# Patient Record
Sex: Female | Born: 1970 | Race: White | Hispanic: No | Marital: Single | Smoking: Former smoker
Health system: Southern US, Community
[De-identification: ages and names within clinical notes are randomized; demographics above are authoritative.]

## PROBLEM LIST (undated history)

## (undated) DIAGNOSIS — D649 Anemia, unspecified: Secondary | ICD-10-CM

## (undated) DIAGNOSIS — F410 Panic disorder [episodic paroxysmal anxiety] without agoraphobia: Secondary | ICD-10-CM

## (undated) DIAGNOSIS — I10 Essential (primary) hypertension: Secondary | ICD-10-CM

## (undated) DIAGNOSIS — K219 Gastro-esophageal reflux disease without esophagitis: Secondary | ICD-10-CM

## (undated) DIAGNOSIS — G894 Chronic pain syndrome: Secondary | ICD-10-CM

## (undated) DIAGNOSIS — R091 Pleurisy: Secondary | ICD-10-CM

## (undated) DIAGNOSIS — M26609 Unspecified temporomandibular joint disorder, unspecified side: Secondary | ICD-10-CM

## (undated) DIAGNOSIS — R011 Cardiac murmur, unspecified: Secondary | ICD-10-CM

## (undated) DIAGNOSIS — G8929 Other chronic pain: Secondary | ICD-10-CM

## (undated) DIAGNOSIS — K529 Noninfective gastroenteritis and colitis, unspecified: Secondary | ICD-10-CM

## (undated) DIAGNOSIS — R110 Nausea: Secondary | ICD-10-CM

## (undated) DIAGNOSIS — R109 Unspecified abdominal pain: Secondary | ICD-10-CM

## (undated) DIAGNOSIS — F419 Anxiety disorder, unspecified: Secondary | ICD-10-CM

## (undated) DIAGNOSIS — M549 Dorsalgia, unspecified: Secondary | ICD-10-CM

## (undated) DIAGNOSIS — J45909 Unspecified asthma, uncomplicated: Secondary | ICD-10-CM

## (undated) DIAGNOSIS — Z9889 Other specified postprocedural states: Secondary | ICD-10-CM

## (undated) HISTORY — DX: Anemia, unspecified: D64.9

## (undated) HISTORY — PX: LASER ABLATION: SHX1947

## (undated) HISTORY — PX: EYE SURGERY: SHX253

## (undated) HISTORY — PX: WISDOM TOOTH EXTRACTION: SHX21

## (undated) HISTORY — PX: ANKLE SURGERY: SHX546

## (undated) HISTORY — DX: Cardiac murmur, unspecified: R01.1

## (undated) HISTORY — DX: Unspecified asthma, uncomplicated: J45.909

## (undated) HISTORY — PX: OTHER SURGICAL HISTORY: SHX169

## (undated) HISTORY — PX: APPENDECTOMY: SHX54

## (undated) HISTORY — PX: DILATION AND CURETTAGE OF UTERUS: SHX78

---

## 1997-06-29 ENCOUNTER — Ambulatory Visit (HOSPITAL_COMMUNITY): Admission: RE | Admit: 1997-06-29 | Discharge: 1997-06-29 | Payer: Self-pay | Admitting: *Deleted

## 1997-07-30 ENCOUNTER — Ambulatory Visit (HOSPITAL_COMMUNITY): Admission: RE | Admit: 1997-07-30 | Discharge: 1997-07-30 | Payer: Self-pay | Admitting: *Deleted

## 1997-08-15 ENCOUNTER — Ambulatory Visit (HOSPITAL_COMMUNITY): Admission: RE | Admit: 1997-08-15 | Discharge: 1997-08-15 | Payer: Self-pay | Admitting: *Deleted

## 1997-12-06 ENCOUNTER — Inpatient Hospital Stay (HOSPITAL_COMMUNITY): Admission: AD | Admit: 1997-12-06 | Discharge: 1997-12-09 | Payer: Self-pay | Admitting: *Deleted

## 1998-01-10 ENCOUNTER — Inpatient Hospital Stay (HOSPITAL_COMMUNITY): Admission: AD | Admit: 1998-01-10 | Discharge: 1998-01-10 | Payer: Self-pay | Admitting: *Deleted

## 1998-07-15 ENCOUNTER — Emergency Department (HOSPITAL_COMMUNITY): Admission: EM | Admit: 1998-07-15 | Discharge: 1998-07-15 | Payer: Self-pay | Admitting: *Deleted

## 1999-06-05 ENCOUNTER — Ambulatory Visit (HOSPITAL_COMMUNITY): Admission: AD | Admit: 1999-06-05 | Discharge: 1999-06-05 | Payer: Self-pay | Admitting: *Deleted

## 1999-06-05 ENCOUNTER — Encounter: Payer: Self-pay | Admitting: *Deleted

## 1999-06-05 ENCOUNTER — Encounter (INDEPENDENT_AMBULATORY_CARE_PROVIDER_SITE_OTHER): Payer: Self-pay | Admitting: Specialist

## 2000-10-26 ENCOUNTER — Other Ambulatory Visit: Admission: RE | Admit: 2000-10-26 | Discharge: 2000-10-26 | Payer: Self-pay | Admitting: *Deleted

## 2001-05-24 ENCOUNTER — Encounter: Payer: Self-pay | Admitting: Orthopedic Surgery

## 2001-05-24 ENCOUNTER — Inpatient Hospital Stay (HOSPITAL_COMMUNITY): Admission: EM | Admit: 2001-05-24 | Discharge: 2001-05-25 | Payer: Self-pay | Admitting: Emergency Medicine

## 2001-05-24 ENCOUNTER — Encounter: Payer: Self-pay | Admitting: Emergency Medicine

## 2002-02-27 ENCOUNTER — Emergency Department (HOSPITAL_COMMUNITY): Admission: EM | Admit: 2002-02-27 | Discharge: 2002-02-27 | Payer: Self-pay | Admitting: Emergency Medicine

## 2002-03-29 ENCOUNTER — Other Ambulatory Visit: Admission: RE | Admit: 2002-03-29 | Discharge: 2002-03-29 | Payer: Self-pay | Admitting: Family Medicine

## 2003-08-21 ENCOUNTER — Emergency Department (HOSPITAL_COMMUNITY): Admission: AD | Admit: 2003-08-21 | Discharge: 2003-08-21 | Payer: Self-pay | Admitting: Family Medicine

## 2004-04-03 ENCOUNTER — Ambulatory Visit: Payer: Self-pay | Admitting: Family Medicine

## 2004-04-03 ENCOUNTER — Other Ambulatory Visit: Admission: RE | Admit: 2004-04-03 | Discharge: 2004-04-03 | Payer: Self-pay | Admitting: Family Medicine

## 2004-04-17 ENCOUNTER — Ambulatory Visit: Payer: Self-pay | Admitting: Family Medicine

## 2004-05-06 ENCOUNTER — Emergency Department (HOSPITAL_COMMUNITY): Admission: EM | Admit: 2004-05-06 | Discharge: 2004-05-06 | Payer: Self-pay | Admitting: Family Medicine

## 2004-05-23 ENCOUNTER — Ambulatory Visit: Payer: Self-pay | Admitting: Family Medicine

## 2004-07-31 ENCOUNTER — Ambulatory Visit: Payer: Self-pay | Admitting: Family Medicine

## 2004-09-16 ENCOUNTER — Ambulatory Visit: Payer: Self-pay | Admitting: Family Medicine

## 2004-12-25 ENCOUNTER — Ambulatory Visit: Payer: Self-pay | Admitting: Family Medicine

## 2004-12-30 ENCOUNTER — Encounter: Admission: RE | Admit: 2004-12-30 | Discharge: 2004-12-30 | Payer: Self-pay | Admitting: Family Medicine

## 2005-01-28 ENCOUNTER — Ambulatory Visit: Payer: Self-pay | Admitting: Family Medicine

## 2005-03-04 ENCOUNTER — Ambulatory Visit (HOSPITAL_COMMUNITY): Admission: RE | Admit: 2005-03-04 | Discharge: 2005-03-04 | Payer: Self-pay | Admitting: Gastroenterology

## 2005-03-04 ENCOUNTER — Encounter (INDEPENDENT_AMBULATORY_CARE_PROVIDER_SITE_OTHER): Payer: Self-pay | Admitting: *Deleted

## 2005-03-09 ENCOUNTER — Ambulatory Visit: Payer: Self-pay | Admitting: Sports Medicine

## 2005-03-11 ENCOUNTER — Encounter: Admission: RE | Admit: 2005-03-11 | Discharge: 2005-03-11 | Payer: Self-pay | Admitting: Sports Medicine

## 2005-04-14 ENCOUNTER — Ambulatory Visit: Payer: Self-pay | Admitting: Family Medicine

## 2005-07-07 ENCOUNTER — Ambulatory Visit: Payer: Self-pay | Admitting: Sports Medicine

## 2005-09-17 ENCOUNTER — Other Ambulatory Visit: Admission: RE | Admit: 2005-09-17 | Discharge: 2005-09-17 | Payer: Self-pay | Admitting: Family Medicine

## 2005-09-17 ENCOUNTER — Ambulatory Visit: Payer: Self-pay | Admitting: Family Medicine

## 2005-10-29 ENCOUNTER — Emergency Department (HOSPITAL_COMMUNITY): Admission: EM | Admit: 2005-10-29 | Discharge: 2005-10-29 | Payer: Self-pay | Admitting: Family Medicine

## 2005-12-16 ENCOUNTER — Encounter (INDEPENDENT_AMBULATORY_CARE_PROVIDER_SITE_OTHER): Payer: Self-pay | Admitting: *Deleted

## 2006-01-14 ENCOUNTER — Ambulatory Visit: Payer: Self-pay | Admitting: Family Medicine

## 2006-01-14 ENCOUNTER — Encounter: Admission: RE | Admit: 2006-01-14 | Discharge: 2006-01-14 | Payer: Self-pay | Admitting: Family Medicine

## 2006-02-25 ENCOUNTER — Ambulatory Visit: Payer: Self-pay | Admitting: Family Medicine

## 2006-04-05 ENCOUNTER — Ambulatory Visit: Payer: Self-pay | Admitting: Sports Medicine

## 2006-07-15 DIAGNOSIS — I10 Essential (primary) hypertension: Secondary | ICD-10-CM | POA: Insufficient documentation

## 2006-07-15 DIAGNOSIS — F429 Obsessive-compulsive disorder, unspecified: Secondary | ICD-10-CM | POA: Insufficient documentation

## 2006-07-15 DIAGNOSIS — Z6841 Body Mass Index (BMI) 40.0 and over, adult: Secondary | ICD-10-CM | POA: Insufficient documentation

## 2006-07-15 DIAGNOSIS — F410 Panic disorder [episodic paroxysmal anxiety] without agoraphobia: Secondary | ICD-10-CM | POA: Insufficient documentation

## 2006-07-15 DIAGNOSIS — D509 Iron deficiency anemia, unspecified: Secondary | ICD-10-CM | POA: Insufficient documentation

## 2006-07-15 DIAGNOSIS — F411 Generalized anxiety disorder: Secondary | ICD-10-CM | POA: Insufficient documentation

## 2006-07-15 DIAGNOSIS — K219 Gastro-esophageal reflux disease without esophagitis: Secondary | ICD-10-CM | POA: Insufficient documentation

## 2006-07-16 ENCOUNTER — Encounter (INDEPENDENT_AMBULATORY_CARE_PROVIDER_SITE_OTHER): Payer: Self-pay | Admitting: *Deleted

## 2006-08-06 ENCOUNTER — Telehealth: Payer: Self-pay | Admitting: *Deleted

## 2006-08-09 ENCOUNTER — Encounter (INDEPENDENT_AMBULATORY_CARE_PROVIDER_SITE_OTHER): Payer: Self-pay | Admitting: Family Medicine

## 2006-08-09 ENCOUNTER — Ambulatory Visit: Payer: Self-pay | Admitting: Family Medicine

## 2006-08-09 LAB — CONVERTED CEMR LAB
Albumin: 4.2 g/dL (ref 3.5–5.2)
Alkaline Phosphatase: 78 units/L (ref 39–117)
Antibody Screen: NEGATIVE
Basophils Relative: 1 % (ref 0–1)
Bilirubin Urine: NEGATIVE
Blood in Urine, dipstick: NEGATIVE
Hemoglobin: 11.6 g/dL — ABNORMAL LOW (ref 12.0–15.0)
Ketones, urine, test strip: NEGATIVE
Lymphs Abs: 1.7 10*3/uL (ref 0.7–3.3)
MCHC: 31.3 g/dL (ref 30.0–36.0)
Monocytes Absolute: 0.4 10*3/uL (ref 0.2–0.7)
Monocytes Relative: 6 % (ref 3–11)
Neutro Abs: 3.6 10*3/uL (ref 1.7–7.7)
Nitrite: NEGATIVE
RBC: 4.46 M/uL (ref 3.87–5.11)
Rh Type: POSITIVE
Rubella: 35.3 intl units/mL — ABNORMAL HIGH
TSH: 0.797 microintl units/mL (ref 0.350–5.50)
Total Bilirubin: 0.5 mg/dL (ref 0.3–1.2)
Total Protein: 6.9 g/dL (ref 6.0–8.3)
Urobilinogen, UA: 0.2
WBC: 5.9 10*3/uL (ref 4.0–10.5)
pH: 7.5

## 2006-08-10 ENCOUNTER — Encounter (INDEPENDENT_AMBULATORY_CARE_PROVIDER_SITE_OTHER): Payer: Self-pay | Admitting: Family Medicine

## 2006-08-10 LAB — CONVERTED CEMR LAB

## 2006-08-24 ENCOUNTER — Ambulatory Visit: Payer: Self-pay | Admitting: Family Medicine

## 2006-08-30 ENCOUNTER — Encounter (INDEPENDENT_AMBULATORY_CARE_PROVIDER_SITE_OTHER): Payer: Self-pay | Admitting: *Deleted

## 2006-08-30 ENCOUNTER — Telehealth: Payer: Self-pay | Admitting: *Deleted

## 2006-08-30 ENCOUNTER — Telehealth (INDEPENDENT_AMBULATORY_CARE_PROVIDER_SITE_OTHER): Payer: Self-pay | Admitting: *Deleted

## 2006-08-31 ENCOUNTER — Ambulatory Visit: Payer: Self-pay | Admitting: Family Medicine

## 2006-09-02 ENCOUNTER — Encounter (INDEPENDENT_AMBULATORY_CARE_PROVIDER_SITE_OTHER): Payer: Self-pay | Admitting: Family Medicine

## 2006-09-02 ENCOUNTER — Ambulatory Visit: Payer: Self-pay | Admitting: Family Medicine

## 2006-09-02 DIAGNOSIS — R011 Cardiac murmur, unspecified: Secondary | ICD-10-CM | POA: Insufficient documentation

## 2006-09-03 ENCOUNTER — Telehealth (INDEPENDENT_AMBULATORY_CARE_PROVIDER_SITE_OTHER): Payer: Self-pay | Admitting: Family Medicine

## 2006-09-03 LAB — CONVERTED CEMR LAB
HCT: 36.8 % (ref 36.0–46.0)
Iron: 22 ug/dL — ABNORMAL LOW (ref 42–145)
MCHC: 31.8 g/dL (ref 30.0–36.0)
MCV: 82.3 fL (ref 78.0–100.0)
Platelets: 339 10*3/uL (ref 150–400)
RDW: 14.5 % — ABNORMAL HIGH (ref 11.5–14.0)
Retic Ct Pct: 1 % (ref 0.4–3.1)
UIBC: 394 ug/dL

## 2006-09-22 ENCOUNTER — Ambulatory Visit: Payer: Self-pay | Admitting: Family Medicine

## 2006-09-22 ENCOUNTER — Encounter (INDEPENDENT_AMBULATORY_CARE_PROVIDER_SITE_OTHER): Payer: Self-pay | Admitting: Family Medicine

## 2006-09-28 ENCOUNTER — Telehealth: Payer: Self-pay | Admitting: *Deleted

## 2006-09-30 ENCOUNTER — Telehealth: Payer: Self-pay | Admitting: *Deleted

## 2006-10-05 ENCOUNTER — Ambulatory Visit (HOSPITAL_COMMUNITY): Admission: RE | Admit: 2006-10-05 | Discharge: 2006-10-05 | Payer: Self-pay | Admitting: Family Medicine

## 2006-10-06 ENCOUNTER — Encounter: Payer: Self-pay | Admitting: *Deleted

## 2006-10-27 ENCOUNTER — Ambulatory Visit: Payer: Self-pay | Admitting: Sports Medicine

## 2006-10-27 ENCOUNTER — Encounter (INDEPENDENT_AMBULATORY_CARE_PROVIDER_SITE_OTHER): Payer: Self-pay | Admitting: Family Medicine

## 2006-11-02 ENCOUNTER — Ambulatory Visit (HOSPITAL_COMMUNITY): Admission: RE | Admit: 2006-11-02 | Discharge: 2006-11-02 | Payer: Self-pay | Admitting: Family Medicine

## 2006-11-03 ENCOUNTER — Telehealth: Payer: Self-pay | Admitting: *Deleted

## 2006-11-03 ENCOUNTER — Ambulatory Visit: Payer: Self-pay | Admitting: Family Medicine

## 2006-11-03 ENCOUNTER — Encounter (INDEPENDENT_AMBULATORY_CARE_PROVIDER_SITE_OTHER): Payer: Self-pay | Admitting: Family Medicine

## 2006-11-03 LAB — CONVERTED CEMR LAB
Bilirubin Urine: NEGATIVE
Blood in Urine, dipstick: NEGATIVE
Glucose, Urine, Semiquant: NEGATIVE
Ketones, urine, test strip: NEGATIVE
Nitrite: NEGATIVE
Protein, U semiquant: NEGATIVE
Specific Gravity, Urine: 1.01
Urobilinogen, UA: 0.2
WBC Urine, dipstick: NEGATIVE
pH: 6.5

## 2006-11-16 ENCOUNTER — Ambulatory Visit (HOSPITAL_COMMUNITY): Admission: RE | Admit: 2006-11-16 | Discharge: 2006-11-16 | Payer: Self-pay | Admitting: Family Medicine

## 2006-11-17 ENCOUNTER — Encounter (INDEPENDENT_AMBULATORY_CARE_PROVIDER_SITE_OTHER): Payer: Self-pay | Admitting: Family Medicine

## 2006-11-25 ENCOUNTER — Ambulatory Visit: Payer: Self-pay | Admitting: Family Medicine

## 2006-12-03 ENCOUNTER — Other Ambulatory Visit: Admission: RE | Admit: 2006-12-03 | Discharge: 2006-12-03 | Payer: Self-pay | Admitting: Obstetrics and Gynecology

## 2006-12-14 ENCOUNTER — Ambulatory Visit (HOSPITAL_COMMUNITY): Admission: RE | Admit: 2006-12-14 | Discharge: 2006-12-14 | Payer: Self-pay | Admitting: Family Medicine

## 2006-12-29 ENCOUNTER — Encounter: Payer: Self-pay | Admitting: *Deleted

## 2007-02-01 ENCOUNTER — Ambulatory Visit (HOSPITAL_COMMUNITY): Admission: RE | Admit: 2007-02-01 | Discharge: 2007-02-01 | Payer: Self-pay | Admitting: Obstetrics and Gynecology

## 2007-03-01 ENCOUNTER — Ambulatory Visit (HOSPITAL_COMMUNITY): Admission: RE | Admit: 2007-03-01 | Discharge: 2007-03-01 | Payer: Self-pay | Admitting: Obstetrics and Gynecology

## 2007-03-24 ENCOUNTER — Telehealth (INDEPENDENT_AMBULATORY_CARE_PROVIDER_SITE_OTHER): Payer: Self-pay | Admitting: Family Medicine

## 2007-03-30 ENCOUNTER — Ambulatory Visit (HOSPITAL_COMMUNITY): Admission: RE | Admit: 2007-03-30 | Discharge: 2007-03-30 | Payer: Self-pay | Admitting: Obstetrics and Gynecology

## 2007-04-07 ENCOUNTER — Inpatient Hospital Stay (HOSPITAL_COMMUNITY): Admission: EM | Admit: 2007-04-07 | Discharge: 2007-04-08 | Payer: Self-pay | Admitting: Obstetrics and Gynecology

## 2007-04-15 ENCOUNTER — Observation Stay (HOSPITAL_COMMUNITY): Admission: RE | Admit: 2007-04-15 | Discharge: 2007-04-15 | Payer: Self-pay | Admitting: Obstetrics and Gynecology

## 2007-04-23 ENCOUNTER — Encounter (INDEPENDENT_AMBULATORY_CARE_PROVIDER_SITE_OTHER): Payer: Self-pay | Admitting: Obstetrics and Gynecology

## 2007-04-23 ENCOUNTER — Inpatient Hospital Stay (HOSPITAL_COMMUNITY): Admission: AD | Admit: 2007-04-23 | Discharge: 2007-04-25 | Payer: Self-pay | Admitting: Obstetrics and Gynecology

## 2007-05-31 ENCOUNTER — Emergency Department (HOSPITAL_COMMUNITY): Admission: EM | Admit: 2007-05-31 | Discharge: 2007-05-31 | Payer: Self-pay | Admitting: Emergency Medicine

## 2007-08-15 ENCOUNTER — Telehealth: Payer: Self-pay | Admitting: *Deleted

## 2007-08-16 ENCOUNTER — Telehealth (INDEPENDENT_AMBULATORY_CARE_PROVIDER_SITE_OTHER): Payer: Self-pay | Admitting: Family Medicine

## 2007-09-02 ENCOUNTER — Telehealth (INDEPENDENT_AMBULATORY_CARE_PROVIDER_SITE_OTHER): Payer: Self-pay | Admitting: *Deleted

## 2007-09-02 ENCOUNTER — Encounter (INDEPENDENT_AMBULATORY_CARE_PROVIDER_SITE_OTHER): Payer: Self-pay | Admitting: Family Medicine

## 2007-09-21 ENCOUNTER — Telehealth (INDEPENDENT_AMBULATORY_CARE_PROVIDER_SITE_OTHER): Payer: Self-pay | Admitting: Family Medicine

## 2007-09-27 ENCOUNTER — Encounter (INDEPENDENT_AMBULATORY_CARE_PROVIDER_SITE_OTHER): Payer: Self-pay | Admitting: *Deleted

## 2007-09-27 ENCOUNTER — Telehealth: Payer: Self-pay | Admitting: *Deleted

## 2007-09-27 ENCOUNTER — Encounter (INDEPENDENT_AMBULATORY_CARE_PROVIDER_SITE_OTHER): Payer: Self-pay | Admitting: Family Medicine

## 2008-03-05 ENCOUNTER — Ambulatory Visit: Payer: Self-pay | Admitting: Family Medicine

## 2008-04-02 ENCOUNTER — Encounter: Admission: RE | Admit: 2008-04-02 | Discharge: 2008-04-02 | Payer: Self-pay | Admitting: Internal Medicine

## 2008-07-03 ENCOUNTER — Telehealth: Payer: Self-pay | Admitting: *Deleted

## 2008-07-06 ENCOUNTER — Telehealth: Payer: Self-pay | Admitting: Family Medicine

## 2008-08-23 ENCOUNTER — Other Ambulatory Visit: Admission: RE | Admit: 2008-08-23 | Discharge: 2008-08-23 | Payer: Self-pay | Admitting: *Deleted

## 2008-08-23 ENCOUNTER — Ambulatory Visit: Payer: Self-pay | Admitting: Family Medicine

## 2008-08-23 ENCOUNTER — Encounter: Payer: Self-pay | Admitting: Family Medicine

## 2008-08-23 ENCOUNTER — Encounter: Admission: RE | Admit: 2008-08-23 | Discharge: 2008-08-23 | Payer: Self-pay | Admitting: *Deleted

## 2008-08-27 ENCOUNTER — Encounter: Payer: Self-pay | Admitting: Family Medicine

## 2008-08-29 ENCOUNTER — Encounter: Payer: Self-pay | Admitting: Family Medicine

## 2008-08-30 ENCOUNTER — Ambulatory Visit: Payer: Self-pay | Admitting: Family Medicine

## 2008-08-30 ENCOUNTER — Encounter: Payer: Self-pay | Admitting: Family Medicine

## 2008-08-30 LAB — CONVERTED CEMR LAB
ALT: 10 units/L (ref 0–35)
AST: 10 units/L (ref 0–37)
Albumin: 3.9 g/dL (ref 3.5–5.2)
Alkaline Phosphatase: 93 units/L (ref 39–117)
BUN: 13 mg/dL (ref 6–23)
CO2: 23 meq/L (ref 19–32)
Calcium: 9.1 mg/dL (ref 8.4–10.5)
Chloride: 107 meq/L (ref 96–112)
Cholesterol: 169 mg/dL (ref 0–200)
Creatinine, Ser: 0.76 mg/dL (ref 0.40–1.20)
Glucose, Bld: 87 mg/dL (ref 70–99)
HCT: 36.7 % (ref 36.0–46.0)
HDL: 43 mg/dL (ref >39)
Hemoglobin: 11.2 g/dL — ABNORMAL LOW (ref 12.0–15.0)
LDL Cholesterol: 113 mg/dL — ABNORMAL HIGH (ref 0–99)
MCHC: 30.5 g/dL (ref 30.0–36.0)
MCV: 81.4 fL (ref 78.0–100.0)
Platelets: 334 10*3/uL (ref 150–400)
Potassium: 4.2 meq/L (ref 3.5–5.3)
RBC: 4.51 M/uL (ref 3.87–5.11)
RDW: 13.8 % (ref 11.5–15.5)
Sodium: 139 meq/L (ref 135–145)
TSH: 1.607 microintl units/mL (ref 0.350–4.500)
Total Bilirubin: 0.4 mg/dL (ref 0.3–1.2)
Total CHOL/HDL Ratio: 3.9
Total Protein: 6.5 g/dL (ref 6.0–8.3)
Triglycerides: 66 mg/dL (ref <150)
VLDL: 13 mg/dL (ref 0–40)
Vit D, 25-Hydroxy: 33 ng/mL (ref 30–89)
WBC: 5.9 10*3/uL (ref 4.0–10.5)

## 2008-09-06 ENCOUNTER — Encounter: Payer: Self-pay | Admitting: Family Medicine

## 2008-09-11 ENCOUNTER — Encounter: Payer: Self-pay | Admitting: Family Medicine

## 2008-11-15 ENCOUNTER — Encounter: Payer: Self-pay | Admitting: Family Medicine

## 2009-05-13 ENCOUNTER — Emergency Department (HOSPITAL_COMMUNITY): Admission: EM | Admit: 2009-05-13 | Discharge: 2009-05-13 | Payer: Self-pay | Admitting: Emergency Medicine

## 2009-05-15 ENCOUNTER — Emergency Department (HOSPITAL_COMMUNITY): Admission: EM | Admit: 2009-05-15 | Discharge: 2009-05-15 | Payer: Self-pay | Admitting: Emergency Medicine

## 2009-07-06 ENCOUNTER — Emergency Department (HOSPITAL_COMMUNITY): Admission: EM | Admit: 2009-07-06 | Discharge: 2009-07-06 | Payer: Self-pay | Admitting: Family Medicine

## 2009-11-06 ENCOUNTER — Emergency Department (HOSPITAL_COMMUNITY): Admission: EM | Admit: 2009-11-06 | Discharge: 2009-11-06 | Payer: Self-pay | Admitting: Emergency Medicine

## 2009-12-24 ENCOUNTER — Encounter: Payer: Self-pay | Admitting: Family Medicine

## 2010-01-11 ENCOUNTER — Emergency Department (HOSPITAL_COMMUNITY): Admission: EM | Admit: 2010-01-11 | Discharge: 2010-01-11 | Payer: Self-pay | Admitting: Family Medicine

## 2010-02-23 ENCOUNTER — Emergency Department (HOSPITAL_COMMUNITY): Admission: EM | Admit: 2010-02-23 | Discharge: 2010-02-23 | Payer: Self-pay | Admitting: Emergency Medicine

## 2010-05-08 ENCOUNTER — Encounter: Payer: Self-pay | Admitting: Family Medicine

## 2010-06-09 ENCOUNTER — Encounter: Payer: Self-pay | Admitting: Internal Medicine

## 2010-06-19 NOTE — Miscellaneous (Signed)
  Clinical Lists Changes  Medications: Removed medication of ALPRAZOLAM 0.5 MG TABS (ALPRAZOLAM) 1 tab by mouth two times a day as needed panic attack or anxiety

## 2010-06-19 NOTE — Miscellaneous (Signed)
  Clinical Lists Changes  Problems: Removed problem of ROUTINE GYNECOLOGICAL EXAMINATION (ICD-V72.31) Removed problem of ANKLE PAIN, RIGHT (ICD-719.47) Removed problem of WEIGHT GAIN (ICD-783.1)

## 2010-07-07 ENCOUNTER — Emergency Department (HOSPITAL_COMMUNITY): Payer: Self-pay

## 2010-07-07 ENCOUNTER — Emergency Department (HOSPITAL_COMMUNITY)
Admission: EM | Admit: 2010-07-07 | Discharge: 2010-07-07 | Discharge status: Against Medical Advice | Payer: Self-pay | Attending: Emergency Medicine | Admitting: Emergency Medicine

## 2010-07-07 DIAGNOSIS — K219 Gastro-esophageal reflux disease without esophagitis: Secondary | ICD-10-CM | POA: Insufficient documentation

## 2010-07-07 DIAGNOSIS — IMO0002 Reserved for concepts with insufficient information to code with codable children: Secondary | ICD-10-CM | POA: Insufficient documentation

## 2010-07-07 DIAGNOSIS — M549 Dorsalgia, unspecified: Secondary | ICD-10-CM | POA: Insufficient documentation

## 2010-07-07 DIAGNOSIS — G8929 Other chronic pain: Secondary | ICD-10-CM | POA: Insufficient documentation

## 2010-07-07 DIAGNOSIS — R011 Cardiac murmur, unspecified: Secondary | ICD-10-CM | POA: Insufficient documentation

## 2010-07-07 DIAGNOSIS — M7989 Other specified soft tissue disorders: Secondary | ICD-10-CM | POA: Insufficient documentation

## 2010-07-07 DIAGNOSIS — R0602 Shortness of breath: Secondary | ICD-10-CM | POA: Insufficient documentation

## 2010-07-07 DIAGNOSIS — M79609 Pain in unspecified limb: Secondary | ICD-10-CM | POA: Insufficient documentation

## 2010-07-07 DIAGNOSIS — I10 Essential (primary) hypertension: Secondary | ICD-10-CM | POA: Insufficient documentation

## 2010-07-07 LAB — URINALYSIS, ROUTINE W REFLEX MICROSCOPIC
Hgb urine dipstick: NEGATIVE
Ketones, ur: NEGATIVE mg/dL
Protein, ur: NEGATIVE mg/dL
Urine Glucose, Fasting: NEGATIVE mg/dL
pH: 7.5 (ref 5.0–8.0)

## 2010-07-07 LAB — BRAIN NATRIURETIC PEPTIDE: Pro B Natriuretic peptide (BNP): 46.4 pg/mL (ref 0.0–100.0)

## 2010-07-07 LAB — BASIC METABOLIC PANEL
Chloride: 109 mEq/L (ref 96–112)
GFR calc non Af Amer: >60 mL/min (ref >60)
Potassium: 4 mEq/L (ref 3.5–5.1)
Sodium: 139 mEq/L (ref 135–145)

## 2010-07-07 LAB — DIFFERENTIAL
Basophils Relative: 1 % (ref 0–1)
Eosinophils Absolute: 0.3 10*3/uL (ref 0.0–0.7)
Lymphs Abs: 1.4 10*3/uL (ref 0.7–4.0)
Neutro Abs: 4.3 10*3/uL (ref 1.7–7.7)
Neutrophils Relative %: 66 % (ref 43–77)

## 2010-07-07 LAB — CBC
Hemoglobin: 9 g/dL — ABNORMAL LOW (ref 12.0–15.0)
MCV: 76.6 fL — ABNORMAL LOW (ref 78.0–100.0)
Platelets: 328 10*3/uL (ref 150–400)
RBC: 3.93 MIL/uL (ref 3.87–5.11)
WBC: 6.5 10*3/uL (ref 4.0–10.5)

## 2010-07-07 LAB — RAPID URINE DRUG SCREEN, HOSP PERFORMED
Amphetamines: NOT DETECTED
Barbiturates: NOT DETECTED
Benzodiazepines: POSITIVE — AB
Cocaine: NOT DETECTED
Opiates: NOT DETECTED
Tetrahydrocannabinol: NOT DETECTED

## 2010-07-07 LAB — URINE MICROSCOPIC-ADD ON

## 2010-07-07 LAB — PREGNANCY, URINE: Preg Test, Ur: NEGATIVE

## 2010-07-09 ENCOUNTER — Encounter: Payer: Self-pay | Admitting: *Deleted

## 2010-07-30 LAB — URINALYSIS, ROUTINE W REFLEX MICROSCOPIC
Glucose, UA: NEGATIVE mg/dL
Hgb urine dipstick: NEGATIVE
Specific Gravity, Urine: 1.008 (ref 1.005–1.030)
pH: 6.5 (ref 5.0–8.0)

## 2010-07-30 LAB — URINE MICROSCOPIC-ADD ON

## 2010-07-30 LAB — CBC
MCH: 24.3 pg — ABNORMAL LOW (ref 26.0–34.0)
Platelets: 359 10*3/uL (ref 150–400)
RBC: 4.75 MIL/uL (ref 3.87–5.11)

## 2010-07-30 LAB — URINE CULTURE: Culture  Setup Time: 201110091725

## 2010-07-30 LAB — BASIC METABOLIC PANEL
CO2: 27 mEq/L (ref 19–32)
Calcium: 9.5 mg/dL (ref 8.4–10.5)
Creatinine, Ser: 0.73 mg/dL (ref 0.4–1.2)
GFR calc Af Amer: >60 mL/min (ref >60)

## 2010-07-30 LAB — DIFFERENTIAL
Basophils Relative: 0 % (ref 0–1)
Eosinophils Absolute: 0.2 10*3/uL (ref 0.0–0.7)
Lymphs Abs: 1.4 10*3/uL (ref 0.7–4.0)
Neutrophils Relative %: 75 % (ref 43–77)

## 2010-07-31 LAB — POCT URINALYSIS DIPSTICK
Bilirubin Urine: NEGATIVE
Glucose, UA: NEGATIVE mg/dL
Nitrite: NEGATIVE

## 2010-07-31 LAB — URINE CULTURE: Culture  Setup Time: 201108271806

## 2010-08-03 LAB — POCT CARDIAC MARKERS
CKMB, poc: <1 ng/mL — ABNORMAL LOW (ref 1.0–8.0)
Myoglobin, poc: 43.2 ng/mL (ref 12–200)
Troponin i, poc: <0.05 ng/mL (ref 0.00–0.09)

## 2010-08-03 LAB — CBC
HCT: 34.7 % — ABNORMAL LOW (ref 36.0–46.0)
Hemoglobin: 11.3 g/dL — ABNORMAL LOW (ref 12.0–15.0)
MCHC: 32.5 g/dL (ref 30.0–36.0)
MCV: 77.5 fL — ABNORMAL LOW (ref 78.0–100.0)
RDW: 15.8 % — ABNORMAL HIGH (ref 11.5–15.5)

## 2010-08-03 LAB — POCT I-STAT, CHEM 8
Calcium, Ion: 1.19 mmol/L (ref 1.12–1.32)
HCT: 37 % (ref 36.0–46.0)
TCO2: 23 mmol/L (ref 0–100)

## 2010-08-03 LAB — DIFFERENTIAL
Basophils Absolute: 0 10*3/uL (ref 0.0–0.1)
Basophils Relative: 1 % (ref 0–1)
Eosinophils Relative: 3 % (ref 0–5)
Monocytes Absolute: 0.6 10*3/uL (ref 0.1–1.0)

## 2010-08-03 LAB — POCT PREGNANCY, URINE: Preg Test, Ur: NEGATIVE

## 2010-08-10 ENCOUNTER — Emergency Department (HOSPITAL_COMMUNITY): Payer: Self-pay

## 2010-08-10 ENCOUNTER — Emergency Department (HOSPITAL_COMMUNITY)
Admission: EM | Admit: 2010-08-10 | Discharge: 2010-08-10 | Disposition: A | Discharge status: Home / Self Care | Payer: No Typology Code available for payment source | Attending: Emergency Medicine | Admitting: Emergency Medicine

## 2010-08-10 DIAGNOSIS — R079 Chest pain, unspecified: Secondary | ICD-10-CM | POA: Insufficient documentation

## 2010-08-10 DIAGNOSIS — M542 Cervicalgia: Secondary | ICD-10-CM | POA: Insufficient documentation

## 2010-08-10 DIAGNOSIS — Z79899 Other long term (current) drug therapy: Secondary | ICD-10-CM | POA: Insufficient documentation

## 2010-08-10 DIAGNOSIS — M545 Low back pain, unspecified: Secondary | ICD-10-CM | POA: Insufficient documentation

## 2010-08-10 DIAGNOSIS — K219 Gastro-esophageal reflux disease without esophagitis: Secondary | ICD-10-CM | POA: Insufficient documentation

## 2010-08-10 DIAGNOSIS — Y929 Unspecified place or not applicable: Secondary | ICD-10-CM | POA: Insufficient documentation

## 2010-08-10 DIAGNOSIS — R6884 Jaw pain: Secondary | ICD-10-CM | POA: Insufficient documentation

## 2010-08-10 DIAGNOSIS — I10 Essential (primary) hypertension: Secondary | ICD-10-CM | POA: Insufficient documentation

## 2010-08-10 DIAGNOSIS — S322XXA Fracture of coccyx, initial encounter for closed fracture: Secondary | ICD-10-CM | POA: Insufficient documentation

## 2010-08-10 DIAGNOSIS — S3210XA Unspecified fracture of sacrum, initial encounter for closed fracture: Secondary | ICD-10-CM | POA: Insufficient documentation

## 2010-08-13 ENCOUNTER — Emergency Department (HOSPITAL_COMMUNITY): Payer: No Typology Code available for payment source

## 2010-08-13 ENCOUNTER — Emergency Department (HOSPITAL_COMMUNITY)
Admission: EM | Admit: 2010-08-13 | Discharge: 2010-08-13 | Disposition: A | Discharge status: Home / Self Care | Payer: No Typology Code available for payment source | Attending: Emergency Medicine | Admitting: Emergency Medicine

## 2010-08-13 DIAGNOSIS — M25559 Pain in unspecified hip: Secondary | ICD-10-CM | POA: Insufficient documentation

## 2010-08-13 DIAGNOSIS — M546 Pain in thoracic spine: Secondary | ICD-10-CM | POA: Insufficient documentation

## 2010-08-13 DIAGNOSIS — I10 Essential (primary) hypertension: Secondary | ICD-10-CM | POA: Insufficient documentation

## 2010-08-13 DIAGNOSIS — M545 Low back pain, unspecified: Secondary | ICD-10-CM | POA: Insufficient documentation

## 2010-08-13 DIAGNOSIS — K219 Gastro-esophageal reflux disease without esophagitis: Secondary | ICD-10-CM | POA: Insufficient documentation

## 2010-08-13 DIAGNOSIS — Z79899 Other long term (current) drug therapy: Secondary | ICD-10-CM | POA: Insufficient documentation

## 2010-09-30 NOTE — H&P (Signed)
[name], [name]                 ACCOUNT NO.:  [id number]   MEDICAL RECORD NO.:  [id number]          PATIENT TYPE:  INP   LOCATION:  9169                          [facility]  [facility]   PHYSICIAN:  [name], M.D.DATE OF BIRTH:  1971-02-05   DATE OF ADMISSION:  04/07/2007  DATE OF DISCHARGE:                              HISTORY & PHYSICAL   This is a 40 year old gravida 5, para 2-0-2-2, [facility] April 15, 2007,  approximately [redacted] weeks gestation.  Prenatal care has been complicated by  advanced maternal age, history of molar pregnancy, history of chronic  hypertension, and history of panic attacks.  Patient is also GBS  positive.  She declined amniocentesis.  Blood pressures have been quite  satisfactory.  She was admitted for induction secondary to  musculoskeletal discomfort at term.   MEDICATIONS:  Vitamins, Nexium.   PAST MEDICAL HISTORY:  Medical:  History of chronic hypertension.   SURGICAL HISTORY:  Please see [facility] forms.   ALLERGIES:  1. MORPHINE.  2. FLAGYL.   FAMILY HISTORY:  Noncontributory.   SOCIAL HISTORY:  Patient denies use of tobacco or significant alcohol.   REVIEW OF SYSTEMS:  Noncontributory.   PHYSICAL EXAMINATION:  A well-developed, well-nourished, pleasant white  female in no acute distress.  Afebrile.  VITAL SIGNS:  Stable.  CHEST:  Lungs are clear.  CARDIAC:  Regular rate and rhythm.  BREAST EXAM:  Deferred.  ABDOMEN:  Gravid with a term fundal height.  Fetal heart tones are  auscultated.  PELVIC EXAMINATION:  Cervix is approximately fingertip dilatation, long,  presenting part high.   IMPRESSION:  1. Intrauterine pregnancy at [redacted] weeks gestation.  2. Advanced maternal age.  3. History of chronic hypertension.  4. History of panic attacks.  5. Musculoskeletal discomfort with desire for induction.   PLAN:  1. Admit.  2. Will institute Cytotec.  3. Will institute Pitocin probably April 08, 2007.      [name], M.D.  Electronically Signed     JAM/MEDQ  D:  04/07/2007  T:  04/07/2007  Job:  [id number]

## 2010-10-03 NOTE — H&P (Signed)
[facility]  Patient:    PETREA, FREDENBURG Visit Number: [id number] MRN: [id number]          Service Type: MED Location: 1E 0101 01 Attending Physician:  Tobey Bride Dictated by:   Illene Labrador. Aplington, M.D. Proc. Date: 05/24/01 Admit Date:  05/24/2001                           History and Physical  HISTORY OF PRESENT ILLNESS: I am seeing this 40 year old female in the [facility] this morning for her right ankle. She was going to the bathroom late yesterday evening and slipped on a step down step injuring her right ankle. EMS brought her to the [facility] Room where x-rays demonstrated at least a bimalleolar and possibly a trimalleolar fracture dislocation of her right ankle. Clinically there was ecchymosis and tinting of the skin medially but no actual compounding. After being called to the emergency room and evaluating the situation, I injected 20 cc of 2% xylocaine into the right ankle with 10 cc in the joint and 10 cc over the distal fibula fracture site following a Betadine prep. I was then able to reduce the ankle and placed a short leg splint cast and she is posted for surgery later today.  PAST MEDICAL HISTORY:  Generally good healthy.  PAST SURGICAL HISTORY:  D&C for a hydatidiform mole but other than that no surgical problems. She has had a fracture of her left foot.  MEDICATIONS:  She presently taking no medications.  ALLERGIES:  None.  REVIEW OF SYSTEMS:  No major health problems.  SOCIAL HISTORY:  Significant in that she is a smoker.  PHYSICAL EXAMINATION:  GENERAL:  Pleasant female initially in severe distress but following injection and reduction of her ankle very comfortable. Significantly over weight.  VITAL SIGNS:  Blood pressure on arrival was 108/67, pulse 90 and regular.  HEENT:  Normal.  NECK:  Supple without masses.  LUNGS:  Clear.  HEART:  Regular rhythm without murmurs, rubs or  gallops.  ABDOMEN:  Soft, nontender without masses.  GENITALIA/RECTAL/BREASTS:  Not done not felt pertinent to present illness.  ORTHOPEDIC:  Positive findings for ______ to the right ankle with some obvious deformity with the foot angulated lateralward. There is ecchymosis and tinting of the skin medially. She has good dorsalis pedis pulse.  Sensation appears to be intact.  IMPRESSION:  Closed displaced bimalleolar (possibly trimalleolar fracture) dislocation, right ankle. Dictated by:   Illene Labrador. Aplington, M.D. Attending Physician:  Tobey Bride DD:  05/24/01 TD:  05/24/01 Job: [id number]

## 2010-10-03 NOTE — H&P (Signed)
NAMELALLIE, STRAHM                 ACCOUNT NO.:  [id number]   MEDICAL RECORD NO.:  [id number]          PATIENT TYPE:  OBV   LOCATION:  9165                          [facility]  [facility]   PHYSICIAN:  James A. Ashley Royalty, M.D.DATE OF BIRTH:  1971/04/25   DATE OF ADMISSION:  04/15/2007  DATE OF DISCHARGE:                              HISTORY & PHYSICAL   HISTORY OF PRESENT ILLNESS:  This is a 40 year old, G5, P2-0-2-2,  currently at 46 weeks' gestation who was admitted approximately 1 week  ago in attempt to induce her labor due to musculoskeletal discomfort.  The intention was to give Cytotec to ripen the cervix, but  unfortunately, the patient was contracting too frequently to allow the  use of that product.  She was discharged home and presents now with  repeat induction.   Medications and past medical history unchanged.   PHYSICAL EXAMINATION:  GENERAL:  Well-developed, well-nourished,  pleasant, white female in no acute distress.  VITAL SIGNS:  Afebrile, vital signs stable.  CHEST:  Lungs are clear.  CARDIAC:  Regular rate and rhythm.  ABDOMEN:  Gravid with normal fundal height.  PELVIC:  Please see labor and delivery notes.   IMPRESSION:  1. Intrauterine pregnancy at 40 weeks' gestation.  2. Additional diagnoses per [facility] forms and history and physical      of 1 week ago.   PLAN:  Will reattempt induction.      James A. Ashley Royalty, M.D.  Electronically Signed     JAM/MEDQ  D:  06/02/2007  T:  06/03/2007  Job:  [id number]

## 2010-10-03 NOTE — Discharge Summary (Signed)
[name], [name]                 ACCOUNT NO.:  [id number]   MEDICAL RECORD NO.:  [id number]          PATIENT TYPE:  INP   LOCATION:  9169                          [facility]   PHYSICIAN:  James A. Ashley Royalty, M.D.DATE OF BIRTH:  [birth year]   DATE OF ADMISSION:  04/07/2007  DATE OF DISCHARGE:  04/08/2007                               DISCHARGE SUMMARY   DISCHARGE DIAGNOSES:  1. Intrauterine pregnancy at 39-week gestation.  2. Advanced maternal age.  3. History of chronic hypertension.  4. History of panic attacks.  5. Musculoskeletal discomfort with desire for induction.  6. Unsuccessful induction during this hospitalization.   CONSULTATIONS:  None.   DISCHARGE MEDICATIONS:  Vitamins.   HISTORY AND PHYSICAL:  This is a 40 year old gravida 5, para 2-0-2-2 at  39-week gestation.  Prenatal care was complicated by the aforementioned  risk factors.  The patient was admitted for induction with the  aforementioned reasons.   On the evening of admission, the intension was to give Cytotec.  However, the patient was contracting too frequently for Cytotec to be  administered.   On the morning of April 08, 2007, she was noted to have a cervical  examination of fingertip/long/high.  Artificial rupture of membranes was  not possible.  Pitocin was administered, but there was no significant  change, and ultimately, the decision was made to abandon the induction  during this hospitalization as Cytotec was felt to be an interval part  of the ripening process and it was not possible to give that product.   The patient was allowed to be discharged home with the intention of  returning in the vicinity of 1 week to reattempt the induction.      James A. Ashley Royalty, M.D.  Electronically Signed     JAM/MEDQ  D:  06/02/2007  T:  06/03/2007  Job:  [id number]

## 2010-10-03 NOTE — Op Note (Signed)
[facility]  Patient:    [name], [name] Visit Number: [id number] MRN: [id number]          Service Type: MED Location: [phone] Attending Physician:  Marlowe Kays Page Dictated by:   Illene Labrador. Aplington, M.D. Proc. Date: 05/24/01 Admit Date:  05/24/2001                             Operative Report  PREOPERATIVE DIAGNOSIS:  Closed bimalleolar fracture dislocation of right ankle.  POSTOPERATIVE DIAGNOSIS:  Closed bimalleolar fracture dislocation of right ankle.  OPERATION:  ORIF of bimalleolar fracture dislocation of right ankle with two 3.5 cannulated screws in the medial malleolus and rush rod in distal fibula.  SURGEON:  Illene Labrador. Aplington, M.D.  ASSISTANT:  Irena Cords, P.A.-C.  ANESTHESIA:  General.  PATHOLOGY AND JUSTIFICATION FOR PROCEDURE:  Earlier today, I performed a closed reduction of the dislocation and better position of the fracture in the emergency room under local and she is here today for the definitive internal fixation because this was an unstable fracture.  The fracture of the medial malleolus was at the level of the plafond and the distal fibula was comminuted.  DESCRIPTION OF PROCEDURE:  Prophylactic antibiotics.  Satisfactory general anesthesia.  C-arm was brought in and the exact status of the fracture was identified.  The postoperative splint from earlier today was carefully removed, and the leg protected as we prepped with DuraPrep, and draped in a sterile field.  She had a significant area of contusion where the medial malleolus had almost gone through the skin medially.  I made my incision just anterior to this, curving down beneath the medial malleolus.  With blunt dissection, the fracture site was identified, and periosteum stripped to either side enough so that we could make out the fracture edges and anatomically reduce the fracture.  We then placed two guide pins up through the medial malleolus and into  the tibia, and when I was satisfied of their position in AP and lateral projections, these were measured at 50 mm, and these were drilled, and two 50 mm, 3.5 screws placed.  X-ray showed anatomic reduction of the medial malleolus.  The posterior screw looked like it was pried to the bone, but it was actually imbedded, so we felt this was an optical illusion.  Laterally, I made a small vertical incision over the distal fibula, and placed a drill hole in the distal fibula followed by awl, which I was able to place up the fibular shaft under AP and lateral C-arm control.  I then followed with a 14 cm rush rod of similar size and position was checked on AP and lateral C-arm with anatomic reduction on the fracture.  The comminuted distal fibular fracture maintained.  The mortis was also in good position.  Final x-rays were taken after impacting the rush rod.  Both wounds were then copiously irrigated with antibiotic solution.  The small lateral incision was closed with an interrupted 3-0 Vicryl subcutaneously, and interrupted 4-0 nylon on the skin. Medially, I reapproximated the periosteum and capsular tissues with interrupted 2-0 Vicryl and the skin with multiple interrupted 4-0 nylon mattress sutures.  Half percent plain Marcaine was injected into both wounds.  Betadine Adaptic dry sterile dressing with a well-padded short leg splint cast applied.  The tourniquet was released.  She tolerated the procedure well and was taken to the recovery room in satisfactory condition with no known  complications. Dictated by:   Illene Labrador. Aplington, M.D. Attending Physician:  Joaquin Courts DD:  05/24/01 TD:  05/25/01 Job: [id number]

## 2010-10-03 NOTE — Op Note (Signed)
[name], [name]                 ACCOUNT NO.:  [id number]   MEDICAL RECORD NO.:  [id number]          PATIENT TYPE:  AMB   LOCATION:  ENDO                         FACILITY:  [facility]   PHYSICIAN:  Graylin Shiver, M.D.   DATE OF BIRTH:  June 24, 1970   DATE OF PROCEDURE:  03/04/2005  DATE OF DISCHARGE:                                 OPERATIVE REPORT   PROCEDURE:  Esophagogastroduodenoscopy with biopsy.   INDICATIONS FOR PROCEDURE:  Chronic heartburn.   Informed consent was obtained after explanation of the risks of bleeding,  infection and perforation.   PREMEDICATION:  Fentanyl 100 mcg IV, Versed 10 mg IV.   PROCEDURE:  With the patient in the left lateral decubitus position, the  Olympus gastroscope was inserted into the oropharynx and passed into the  esophagus. It was advanced down the esophagus and into the stomach and into  the duodenum. The second portion and bulb of the duodenum looked normal. The  stomach showed an erythematous appearance to the mucosa compatible with a  gastritis. Biopsies were obtained from the stomach for histology and to look  for evidence of H. pylori. No lesions were seen in the fundus or cardia. The  esophagus looked normal. The esophagogastric junction was at 38 cm. The  distal esophagus was biopsied for histology. She tolerated the procedure  well without complications.   IMPRESSION:  Gastritis.   PLAN:  The biopsies will be checked. The patient will remain on Prevacid 30  mg b.i.d.           ______________________________  Graylin Shiver, M.D.     SFG/MEDQ  D:  03/04/2005  T:  03/04/2005  Job:  [id number]   cc:   Towana Badger, M.D.  Fax: [phone]

## 2010-10-03 NOTE — Discharge Summary (Signed)
Sharon, Stokes                 ACCOUNT NO.:  [id number]   MEDICAL RECORD NO.:  [id number]          PATIENT TYPE:  OBV   LOCATION:  9165                          [facility]   PHYSICIAN:  James A. Ashley Royalty, M.D.DATE OF BIRTH:  09-Mar-1971   DATE OF ADMISSION:  04/15/2007  DATE OF DISCHARGE:  04/15/2007                               DISCHARGE SUMMARY   DISCHARGE DIAGNOSES:  1. Intrauterine pregnancy at 40 weeks' gestation, undelivered.  2. Unsuccessful induction.   CONSULTATIONS:  None.   DISCHARGE MEDICATIONS:  Vitamins.   HOSPITAL COURSE:  The patient was admitted for induction of labor due to  musculoskeletal discomfort.  The cervix did not respond to Pitocin a  week ago and similarly did not significantly respond to Pitocin again.  After consultation with the patient nurse and myself, the decision was  made to postpone the induction one more week and hence the patient was  allowed to be discharged home afebrile in satisfactory condition later  on that day.   DISPOSITION:  The patient will return to [facility] and  Obstetrics in several days for followup.      James A. Ashley Royalty, M.D.  Electronically Signed     JAM/MEDQ  D:  06/02/2007  T:  06/02/2007  Job:  [id number]

## 2011-02-23 LAB — CBC
HCT: 31.4 — ABNORMAL LOW
Hemoglobin: 10.8 — ABNORMAL LOW
Hemoglobin: 13.5
MCV: 89.1
Platelets: 216
RBC: 3.53 — ABNORMAL LOW
RBC: 3.65 — ABNORMAL LOW
RBC: 4.44
WBC: 11.4 — ABNORMAL HIGH
WBC: 13.3 — ABNORMAL HIGH
WBC: 14.4 — ABNORMAL HIGH

## 2011-02-23 LAB — TYPE AND SCREEN: ABO/RH(D): A POS

## 2011-02-23 LAB — RPR: RPR Ser Ql: NONREACTIVE

## 2011-02-24 LAB — CBC
HCT: 38.1
Hemoglobin: 13.1
MCHC: 34.3
MCHC: 34.5
MCV: 87.8
MCV: 88
RBC: 4.11
RDW: 13.3
RDW: 13.6

## 2011-02-24 LAB — RPR: RPR Ser Ql: NONREACTIVE

## 2011-03-22 ENCOUNTER — Encounter: Payer: Self-pay | Admitting: *Deleted

## 2011-03-22 ENCOUNTER — Emergency Department (INDEPENDENT_AMBULATORY_CARE_PROVIDER_SITE_OTHER)
Admission: EM | Admit: 2011-03-22 | Discharge: 2011-03-22 | Disposition: A | Discharge status: Home / Self Care | Payer: Self-pay | Source: Home / Self Care | Attending: Family Medicine | Admitting: Family Medicine

## 2011-03-22 ENCOUNTER — Emergency Department (INDEPENDENT_AMBULATORY_CARE_PROVIDER_SITE_OTHER): Payer: Self-pay

## 2011-03-22 DIAGNOSIS — S93401A Sprain of unspecified ligament of right ankle, initial encounter: Secondary | ICD-10-CM

## 2011-03-22 DIAGNOSIS — S93409A Sprain of unspecified ligament of unspecified ankle, initial encounter: Secondary | ICD-10-CM

## 2011-03-22 HISTORY — DX: Essential (primary) hypertension: I10

## 2011-03-22 MED ORDER — HYDROCODONE-ACETAMINOPHEN 5-325 MG PO TABS: 1.0000 | ORAL_TABLET | Freq: Three times a day (TID) | ORAL | Status: AC

## 2011-03-22 NOTE — ED Provider Notes (Signed)
History     CSN: [id number] Arrival date & time: 03/22/2011  5:18 PM   None     Chief Complaint  Patient presents with  . Leg Pain    co pain in right leg, ankle, right foot, and across low back, hx of fx to leg, having rods placed 8 yrs ago, fell 1 wk ago and has had increasing pain and swelling in right ankle    (Consider location/radiation/quality/duration/timing/severity/associated sxs/prior treatment) Patient is a 40 y.o. female presenting with leg pain.  Leg Pain  Incident onset: APPROX 1 WK AGO. The injury mechanism was a fall. The pain is present in the right foot (RIGHT LOWER LEG). The quality of the pain is described as sharp. The pain is moderate. The pain has been constant since onset. Pertinent negatives include no numbness and no inability to bear weight. Associated symptoms comments: SWELLING. HX OF A FALL  APPROX 8 YRS AGO . ORIF .    Past Medical History  Diagnosis Date  . Hypertension     Past Surgical History  Procedure Date  . Eye surgery   . Dilitation and curettage   . Ankle surgery     Family History  Problem Relation Age of Onset  . Hypertension Other     History  Substance Use Topics  . Smoking status: Current Everyday Smoker  . Smokeless tobacco: Not on file  . Alcohol Use: No    OB History    Grav Para Term Preterm Abortions TAB SAB Ect Mult Living                  Review of Systems  Constitutional: Negative.   HENT: Negative.   Respiratory: Negative.   Cardiovascular: Negative.   Gastrointestinal: Negative.   Musculoskeletal: Positive for myalgias, joint swelling and gait problem.  Neurological: Negative for numbness.    Allergies  Morphine sulfate and Paroxetine  Home Medications   Current Outpatient Rx  Name Route Sig Dispense Refill  . LISINOPRIL 10 MG PO TABS Oral Take 10 mg by mouth daily.        BP 130/65  Pulse 78  Temp(Src) 98.1 F (36.7 C) (Oral)  Resp 16  LMP 03/20/2011  Physical Exam  Constitutional:  She appears well-developed and well-nourished.       OBESE  Cardiovascular: Normal rate.   Pulmonary/Chest: Breath sounds normal.  Musculoskeletal:       EVAL OF THE RIGHT LOWER LEG REVEALS SLIGHT SWELLING. FULL ROM OF THE KNEE. PAIN OVER THE RIGHT FIBULAR AREA. PAIN RIGHT ANKLE AND ARCH OF THE RIGHT FOOT. SKIN CLEAR, NO DEFORMITY. SENSORY INTACT.     ED Course  Procedures (including critical care time)  Labs Reviewed - No data to display Dg Tibia/fibula Right  03/22/2011  *RADIOLOGY REPORT*  Clinical Data: Fall  RIGHT TIBIA AND FIBULA - 2 VIEW  Comparison: None.  Findings: Metallic hardware is present in the distal tibia and fibula.  No breakage or loosening of the hardware.  Degenerative changes of the ankle joint are noted.  No acute fracture and no dislocation.  IMPRESSION: No acute bony pathology.  Original Report Authenticated By: Donavan Burnet, M.D.   Dg Foot Complete Right  03/22/2011  *RADIOLOGY REPORT*  Clinical Data: Fall  RIGHT FOOT COMPLETE - 3+ VIEW  Comparison: None.  Findings: Hardware is present within the lateral and medial malleoli of the ankle joint.  Moderate degenerative changes at the ankle joint.  Moderate spurring at the inferior calcaneus.  Mild degenerative changes of the midfoot.  No acute fracture and no dislocation.  No breakage or loosening of the hardware.  IMPRESSION: No acute bony injury.  Chronic changes.  Original Report Authenticated By: Donavan Burnet, M.D.     No diagnosis found.            Randa Spike, MD 03/22/11 [id number]

## 2011-04-19 ENCOUNTER — Emergency Department (INDEPENDENT_AMBULATORY_CARE_PROVIDER_SITE_OTHER)
Admission: EM | Admit: 2011-04-19 | Discharge: 2011-04-19 | Disposition: A | Discharge status: Home / Self Care | Payer: Self-pay | Source: Home / Self Care | Attending: Family Medicine | Admitting: Family Medicine

## 2011-04-19 ENCOUNTER — Emergency Department (HOSPITAL_COMMUNITY)
Admission: EM | Admit: 2011-04-19 | Discharge: 2011-04-20 | Disposition: A | Discharge status: Home / Self Care | Payer: Self-pay | Attending: Emergency Medicine | Admitting: Emergency Medicine

## 2011-04-19 ENCOUNTER — Encounter (HOSPITAL_COMMUNITY): Payer: Self-pay | Admitting: *Deleted

## 2011-04-19 ENCOUNTER — Encounter (HOSPITAL_COMMUNITY): Payer: Self-pay | Admitting: Emergency Medicine

## 2011-04-19 DIAGNOSIS — F419 Anxiety disorder, unspecified: Secondary | ICD-10-CM

## 2011-04-19 DIAGNOSIS — F429 Obsessive-compulsive disorder, unspecified: Secondary | ICD-10-CM | POA: Insufficient documentation

## 2011-04-19 DIAGNOSIS — I10 Essential (primary) hypertension: Secondary | ICD-10-CM | POA: Insufficient documentation

## 2011-04-19 DIAGNOSIS — F411 Generalized anxiety disorder: Secondary | ICD-10-CM

## 2011-04-19 HISTORY — DX: Unspecified temporomandibular joint disorder, unspecified side: M26.609

## 2011-04-19 HISTORY — DX: Anxiety disorder, unspecified: F41.9

## 2011-04-19 HISTORY — DX: Panic disorder (episodic paroxysmal anxiety): F41.0

## 2011-04-19 NOTE — ED Notes (Signed)
Pt c/o panic attack since Friday.  States she was seen at [facility] earlier today and was told to go to mental health.
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
## 2011-04-19 NOTE — ED Notes (Signed)
C/o R jaw pain since tooth broke on Thanksgiving.

## 2011-04-20 MED ORDER — LORAZEPAM 1 MG PO TABS
1.0000 mg | ORAL_TABLET | Freq: Once | ORAL | Status: AC
  Administered 2011-04-20: 1 mg via ORAL
  Filled 2011-04-20: qty 1

## 2011-04-20 MED ORDER — LORAZEPAM 1 MG PO TABS: 1.0000 mg | ORAL_TABLET | Freq: Two times a day (BID) | ORAL | Status: AC | PRN

## 2011-04-20 NOTE — ED Provider Notes (Signed)
History     CSN: [id number] Arrival date & time: 04/19/2011 10:05 PM   First MD Initiated Contact with Patient 04/19/11 2320      Chief Complaint  Patient presents with  . Anxiety    (Consider location/radiation/quality/duration/timing/severity/associated sxs/prior treatment) HPI The patient p/w concerns of increasing anxiety and restlessness.  She has OCD and GAD and was taking Xanax TID until running out three weeks ago.  She notes that she was "OK" until as few days ago when she developed increasing restlessness, anxiety and repetitive behaviors (picking at skin).  Since onset these Sx have not improved w anything, and are worsening to the point of being incapacitating. No new pain, dyspnea, disorientation, or other focal complaints. Past Medical History  Diagnosis Date  . Hypertension   . Anxiety   . Panic attack   . TMJ disease     Past Surgical History  Procedure Date  . Eye surgery   . Dilitation and curettage   . Ankle surgery     Family History  Problem Relation Age of Onset  . Hypertension Other     History  Substance Use Topics  . Smoking status: Current Everyday Smoker  . Smokeless tobacco: Not on file  . Alcohol Use: No    OB History    Grav Para Term Preterm Abortions TAB SAB Ect Mult Living                  Review of Systems  All other systems reviewed and are negative.    Allergies  Morphine sulfate; Other; and Paroxetine  Home Medications   Current Outpatient Rx  Name Route Sig Dispense Refill  . ALPRAZOLAM ER 2 MG PO TB24 Oral Take 2 mg by mouth 3 (three) times daily as needed. For anxiety      . IBUPROFEN 200 MG PO TABS Oral Take 800 mg by mouth every 6 (six) hours as needed. For pain     . LISINOPRIL 10 MG PO TABS Oral Take 10 mg by mouth daily.      [name] MELATONIN 3 MG PO TABS Oral Take 1 tablet by mouth at bedtime as needed. For sleep     . LORAZEPAM 1 MG PO TABS Oral Take 1 tablet (1 mg total) by mouth 2 (two) times daily as  needed for anxiety. 20 tablet 0    BP 128/84  Pulse 69  Temp(Src) 98.2 F (36.8 C) (Oral)  Resp 20  SpO2 98%  LMP 04/14/2011  Physical Exam  Constitutional: She is oriented to person, place, and time. She appears well-developed and well-nourished.  HENT:  Head: Normocephalic and atraumatic.  Eyes: EOM are normal.  Cardiovascular: Normal rate and regular rhythm.   Pulmonary/Chest: Effort normal and breath sounds normal.  Abdominal: She exhibits no distension.  Musculoskeletal: She exhibits no edema and no tenderness.  Neurological: She is alert and oriented to person, place, and time.  Skin: Skin is warm and dry.  Psychiatric: Her speech is normal and behavior is normal. Judgment and thought content normal. Her mood appears anxious. Cognition and memory are normal.    ED Course  Procedures (including critical care time)  Labs Reviewed - No data to display No results found.   1. ANXIETY       MDM  This F w GAD, OCD now p/w days of worsening anxiety and restlessness.  The absence of notable PE findings, the stable VS, and and the patient demonstration of clear insight and cognitive capacity  is reassuring.  Her Sx may be due to her chronic condition, with consideration of an element of withdrawal (following "years" of xanax use.)  Replacement meds were d/w the patient, as well as the need for aggressive continued eval / management of her condition.  She was d/c w ativan, in the company of a companion.        Gerhard Munch, MD 04/20/11 [phone]

## 2011-04-21 NOTE — ED Provider Notes (Signed)
Medical screening examination/treatment/procedure(s) were performed by non-physician practitioner and as supervising physician I was immediately available for consultation/collaboration.  LANEY,RONNIE   Ronnie Laney, MD 04/21/11 1638 

## 2011-06-28 ENCOUNTER — Encounter (HOSPITAL_COMMUNITY): Payer: Self-pay

## 2011-06-28 ENCOUNTER — Emergency Department (HOSPITAL_COMMUNITY)
Admission: EM | Admit: 2011-06-28 | Discharge: 2011-06-28 | Disposition: A | Discharge status: Home / Self Care | Payer: Self-pay | Attending: Emergency Medicine | Admitting: Emergency Medicine

## 2011-06-28 DIAGNOSIS — H2000 Unspecified acute and subacute iridocyclitis: Secondary | ICD-10-CM | POA: Insufficient documentation

## 2011-06-28 DIAGNOSIS — H5712 Ocular pain, left eye: Secondary | ICD-10-CM

## 2011-06-28 DIAGNOSIS — I10 Essential (primary) hypertension: Secondary | ICD-10-CM | POA: Insufficient documentation

## 2011-06-28 DIAGNOSIS — H5710 Ocular pain, unspecified eye: Secondary | ICD-10-CM | POA: Insufficient documentation

## 2011-06-28 DIAGNOSIS — Z79899 Other long term (current) drug therapy: Secondary | ICD-10-CM | POA: Insufficient documentation

## 2011-06-28 MED ORDER — CIPROFLOXACIN HCL 0.3 % OP SOLN: 2.0000 [drp] | Freq: Three times a day (TID) | OPHTHALMIC | Status: DC

## 2011-06-28 MED ORDER — OXYCODONE-ACETAMINOPHEN 5-325 MG PO TABS: 1.0000 | ORAL_TABLET | ORAL | Status: DC | PRN

## 2011-06-28 MED ORDER — FLUORESCEIN SODIUM 1 MG OP STRP
1.0000 | ORAL_STRIP | Freq: Once | OPHTHALMIC | Status: AC
  Administered 2011-06-28: 1 via OPHTHALMIC
  Filled 2011-06-28: qty 1

## 2011-06-28 MED ORDER — HYDROCODONE-ACETAMINOPHEN 5-325 MG PO TABS
1.0000 | ORAL_TABLET | Freq: Once | ORAL | Status: AC
  Administered 2011-06-28: 1 via ORAL
  Filled 2011-06-28: qty 1

## 2011-06-28 MED ORDER — TETRACAINE HCL 0.5 % OP SOLN
2.0000 [drp] | Freq: Once | OPHTHALMIC | Status: AC
  Administered 2011-06-28: 2 [drp] via OPHTHALMIC
  Filled 2011-06-28: qty 2

## 2011-06-28 NOTE — ED Notes (Signed)
Visual acuity: Both eyes: 20/25 Right eye:20/100 Left eye: 20/40

## 2011-06-28 NOTE — ED Notes (Signed)
sts contact stuck in left eye, has not been removed for three days.

## 2011-06-28 NOTE — ED Provider Notes (Signed)
History     CSN: [id number]  Arrival date & time 06/28/11  1426   First MD Initiated Contact with Patient 06/28/11 1528      Chief Complaint  Patient presents with  . Foreign Body in Eye    (Consider location/radiation/quality/duration/timing/severity/associated sxs/prior treatment) Patient is a 41 y.o. female presenting with eye pain. The history is provided by the patient.  Eye Pain This is a new problem. The current episode started today. The problem occurs constantly. The problem has been unchanged. Pertinent negatives include no abdominal pain, chest pain, congestion, coughing, fever, headaches, nausea, neck pain, numbness, rash, visual change or vomiting. Exacerbated by: worse with light. She has tried nothing for the symptoms.  Eye Pain This is a new problem. The current episode started today. The problem occurs constantly. The problem has been unchanged. Pertinent negatives include no chest pain, no abdominal pain, no headaches and no shortness of breath. Exacerbated by: worse with light. She has tried nothing for the symptoms.    Past Medical History  Diagnosis Date  . Hypertension   . Anxiety   . Panic attack   . TMJ disease     Past Surgical History  Procedure Date  . Eye surgery   . Dilitation and curettage   . Ankle surgery     Family History  Problem Relation Age of Onset  . Hypertension Other     History  Substance Use Topics  . Smoking status: Current Everyday Smoker  . Smokeless tobacco: Not on file  . Alcohol Use: No    OB History    Grav Para Term Preterm Abortions TAB SAB Ect Mult Living                  Review of Systems  Constitutional: Negative for fever.  HENT: Negative for congestion, rhinorrhea and neck pain.        Pain left eye   Eyes: Positive for pain.  Respiratory: Negative for cough and shortness of breath.   Cardiovascular: Negative for chest pain.  Gastrointestinal: Negative for nausea, vomiting and abdominal pain.    Skin: Negative for rash.  Neurological: Negative for dizziness, numbness and headaches.    Allergies  Morphine sulfate; Other; and Paroxetine  Home Medications   Current Outpatient Rx  Name Route Sig Dispense Refill  . IBUPROFEN 200 MG PO TABS Oral Take 800 mg by mouth every 6 (six) hours as needed. For pain     . LISINOPRIL 10 MG PO TABS Oral Take 10 mg by mouth daily.      [name] LORAZEPAM 1 MG PO TABS Oral Take 1 mg by mouth 2 (two) times daily.      BP 134/77  Pulse 93  Temp(Src) 98.6 F (37 C) (Oral)  Resp 20  SpO2 100%  LMP 06/25/2011  Physical Exam  Nursing note and vitals reviewed. Constitutional: She is oriented to person, place, and time. Vital signs are normal. She appears well-developed and well-nourished.  Non-toxic appearance. No distress.  HENT:  Head: Normocephalic and atraumatic.  Mouth/Throat: Oropharynx is clear and moist.  Eyes: EOM and lids are normal. No foreign bodies found. Left conjunctiva is injected. Left pupil is not round (pupil irregular with defect in iris at 4 o'clock position consistent with history of same s/p traumatic eye injury years ago).  Fundoscopic exam:      The right eye shows no arteriolar narrowing, no AV nicking, no hemorrhage and no papilledema.       The  left eye shows no arteriolar narrowing, no AV nicking, no hemorrhage and no papilledema.  Slit lamp exam:      The right eye shows no hyphema and no fluorescein uptake.       The left eye shows no hyphema and no fluorescein uptake.       Ocular pressure left eye: 5,5,9  Cells and flare on slit lamp exam of left eye.   Neck: Normal range of motion. Neck supple.  Cardiovascular: Normal rate, regular rhythm and normal heart sounds.   Pulmonary/Chest: Effort normal and breath sounds normal.  Abdominal: Soft. Bowel sounds are normal. There is no tenderness.  Musculoskeletal: Normal range of motion.  Neurological: She is alert and oriented to person, place, and time. She has normal  strength. No cranial nerve deficit. GCS eye subscore is 4. GCS verbal subscore is 5. GCS motor subscore is 6.  Skin: Skin is warm and dry.    ED Course  Procedures (including critical care time)  Labs Reviewed - No data to display No results found.   1. Left eye pain   2. Acute iritis of left eye       MDM  40yo CF with PMH significant for anxiety and traumatic injury to her left eye with subsequent irregular pupil and iris defect as well as migraines s/p injury. She presents to the ED today due to left eye pain and sensation of foreign body. She thought maybe she left her contact in. Tetracaine gtts applied.  Eyes stained and exam under slit lamp. No FBs identified. Ocular pressures normal.   Visual acuity R eye (unaffected) 20/100, L eye 20/40.  Pt given vicodin for pain.   Dr. Ignacia Palma discussed case c Dr. Charlotte Sanes optho who recs pain meds, no contact wearing, and cipro gtts. She will see pt at 0830 tomorrow morning.   I saw and evaluated the patient, reviewed the resident's note and I agree with the findings and plan. Pt who wears contact lenses with left eye pain.  Exam shows good visual acuity, no redness of the bulbar of palpebral conjunctivae, corneae clear to exam with flourescein, AC and fundus appear normal.  Discussed with Dr. Charlotte Sanes, who thinks it is probably related to contact lens wear, and who recommended Cipro eye drops, pain medication, and office followup tomorrow. Osvaldo Human, M.D.     Verne Carrow, MD 06/29/11 0004  Carleene Cooper III, MD 06/30/11 [phone]

## 2011-07-05 ENCOUNTER — Encounter (HOSPITAL_COMMUNITY): Payer: Self-pay | Admitting: *Deleted

## 2011-07-05 ENCOUNTER — Emergency Department (HOSPITAL_COMMUNITY)
Admission: EM | Admit: 2011-07-05 | Discharge: 2011-07-05 | Disposition: A | Discharge status: Home / Self Care | Payer: Self-pay | Attending: Emergency Medicine | Admitting: Emergency Medicine

## 2011-07-05 ENCOUNTER — Emergency Department (HOSPITAL_COMMUNITY): Payer: Self-pay

## 2011-07-05 DIAGNOSIS — M25579 Pain in unspecified ankle and joints of unspecified foot: Secondary | ICD-10-CM | POA: Insufficient documentation

## 2011-07-05 DIAGNOSIS — M79609 Pain in unspecified limb: Secondary | ICD-10-CM | POA: Insufficient documentation

## 2011-07-05 DIAGNOSIS — M545 Low back pain, unspecified: Secondary | ICD-10-CM | POA: Insufficient documentation

## 2011-07-05 DIAGNOSIS — W108XXA Fall (on) (from) other stairs and steps, initial encounter: Secondary | ICD-10-CM | POA: Insufficient documentation

## 2011-07-05 DIAGNOSIS — I10 Essential (primary) hypertension: Secondary | ICD-10-CM | POA: Insufficient documentation

## 2011-07-05 DIAGNOSIS — M549 Dorsalgia, unspecified: Secondary | ICD-10-CM

## 2011-07-05 MED ORDER — IBUPROFEN 800 MG PO TABS: 800.0000 mg | ORAL_TABLET | Freq: Once | ORAL | Status: DC

## 2011-07-05 MED ORDER — OXYCODONE-ACETAMINOPHEN 5-325 MG PO TABS
1.0000 | ORAL_TABLET | Freq: Once | ORAL | Status: AC
  Administered 2011-07-05: 1 via ORAL
  Filled 2011-07-05: qty 1

## 2011-07-05 MED ORDER — IBUPROFEN 800 MG PO TABS
800.0000 mg | ORAL_TABLET | Freq: Once | ORAL | Status: AC
  Administered 2011-07-05: 800 mg via ORAL
  Filled 2011-07-05: qty 1

## 2011-07-05 MED ORDER — OXYCODONE-ACETAMINOPHEN 5-325 MG PO TABS: 2.0000 | ORAL_TABLET | ORAL | Status: DC | PRN

## 2011-07-05 MED ORDER — OXYCODONE-ACETAMINOPHEN 5-325 MG PO TABS: 1.0000 | ORAL_TABLET | Freq: Once | ORAL | Status: DC

## 2011-07-05 MED ORDER — CYCLOBENZAPRINE HCL 10 MG PO TABS: 10.0000 mg | ORAL_TABLET | Freq: Three times a day (TID) | ORAL | Status: AC | PRN

## 2011-07-05 NOTE — ED Notes (Signed)
Reports falling on Friday, having pain to right leg, and lower back. Ambulatory at triage.

## 2011-07-05 NOTE — ED Provider Notes (Signed)
Medical screening examination/treatment/procedure(s) were performed by non-physician practitioner and as supervising physician I was immediately available for consultation/collaboration.   Lyanne Co, MD 07/05/11 [phone]

## 2011-07-05 NOTE — Progress Notes (Signed)
Orthopedic Tech Progress Note Patient Details:  [name] Jul 29, 1970 [id number]  Other Ortho Devices Type of Ortho Device: Crutches;ASO Ortho Device Location: right ankle Ortho Device Interventions: Application   Moustafa Mossa 07/05/2011, 5:09 PM

## 2011-07-05 NOTE — Discharge Instructions (Signed)
Mrs. RT the x-rays today do not show any fractures. Use the crutches and an ASO splint and followup with Dr. Alfredo Bach if he has further problems. Ice and elevate the extremity. Use of narcotic pain medicine and Flexeril as needed for pain do not drive with these medications.   Chronic Ankle Instability with Rehab Chronic ankle instability is characterized by instability of the ankle for a prolonged period of time. There are two types of ankle instability.   A functionally unstable ankle is one that gives way; however, it may or may not be loose.   A mechanically unstable ankle is one that is loose due to a problem with the ligaments. However, not all loose ankles are unstable or give way.  SYMPTOMS   Recurrent ankle pain and giving way of the ankle.   Difficulty running on uneven surfaces, jumping, or changing directions while running (cutting).   Pain, tenderness, swelling, and bruising at the site of injury.   Weakness or looseness in the ankle joint.   Occasionally, impaired ability to walk soon after injury.  CAUSES   Ankle instability is most commonly caused by a previous ankle injury that did not completely heal.   Ankle instability may also be caused by stress imposed from either side of the ankle joint that can temporarily force or pry the ankle bone (talus) out of its normal alignment. The ligaments that hold the joint in place are stretched and torn.  RISK INCREASES WITH:  Previous ankle injury.   You were born with (congenital) joint looseness.   Too-rapid return to activity after previous ankle sprain.   Activities in which the foot may land sideways while running, walking, and jumping (basketball, volleyball, or soccer) or walking or running on uneven or rough surfaces.   Inadequate ankle support during athletics.   Poor strength and flexibility.   Poor balance skills.  PREVENTION  Warm up and stretch properly before activity.   Maintain physical fitness:     Ankle and leg flexibility, muscle strength, and endurance.   Balanced training activities.   Cardiovascular fitness.   Learn and use proper technique during sports and have a coach correct improper technique.   Taping, protective strapping, bracing, or high-top tennis shoes may be used. Initially, tape is best; however, it loses most of its support function within 10 to 15 minutes.   Wear proper protective shoes (high-top shoes with taping or bracing).   Provide the ankle with support during sports and practice activities for 12 months following injury.   Complete rehabilitation after initial injury.  PROGNOSIS  If treated properly, ankle instability normally resolves with non-surgical treatment. However, for certain cases of mechanical instability surgery is necessary. RELATED COMPLICATIONS   Frequent recurrence of symptoms is possible. Following rehabilitation guidelines correctly decreases the frequency of recurrence and optimizes healing time.   Injury to other structures (bone, cartilage, or tendon).   Chronically unstable or arthritic ankle joint.   Complications of surgery including infection, bleeding, injury to nerves, continued giving way, ankle stiffness, and ankle weakness.  TREATMENT Treatment initially involves ice, medication, and compression bandages are used to help reduce pain and inflammation, It may be necessary to immobilize the joint for a period of time to allow for healing. Strengthening and stretching exercises are recommended after immobilization to help regain strength and flexibility. These exercises may be completed at home or with a therapist. Some individuals find placing a heel wedge in the shoe, taping or bracing, and wearing high-top shoes  helpful. If symptoms last for longer than 3 months, despite treatment, then surgery may be recommended. HEAT AND COLD  Cold treatment (icing) relieves pain and reduces inflammation. Cold treatment should be  applied for 10 to 15 minutes every 2 to 3 hours for inflammation and pain and immediately after any activity that aggravates your symptoms. Use ice packs or an ice massage.   Heat treatment may be used prior to performing the stretching and strengthening activities prescribed by your caregiver, physical therapist, or athletic trainer. Use a heat pack or a warm soak.  MEDICATION   There are no specific medications to improve the stability of your ankle.   If pain medication is necessary, then nonsteroidal anti-inflammatory medications, such as aspirin and ibuprofen, or other minor pain relievers, such as acetaminophen, are often recommended.   Do not take pain medication within 7 days before surgery.   Prescription pain relievers may be prescribed if deemed necessary by your caregiver. Use only as directed and only as much as you need.   Ointments applied to the skin may be helpful.  SEEK MEDICAL CARE IF:   Pain, swelling, or bruising worsens despite treatment.   You develop locking or catching in the ankle.   You have pain, numbness, or coldness in the foot.   You develop giving way of the ankle which persists after 3 to 6 months of rehabilitation.  EXERCISES  RANGE OF MOTION AND STRETCHING EXERCISES - Ankle Instability, Chronic, Non-Surgical Intervention Since ankles demonstrate instability when they have too much motion throughout the joints, range of motion and stretching exercises are not helpful and can even be harmful. Only complete range of motion and stretching exercises for your ankle if instructed by your physician, physical therapist or athletic trainer. An effective rehabilitation program for unstable ankles will include mostly strengthening and balance exercises. STRENGTHENING EXERCISES - Ankle Instability, Chronic, Non-Surgical Intervention  These exercises may help you when beginning to rehabilitate your injury. They may resolve your symptoms with or without further  involvement from your physician, physical therapist or athletic trainer. While completing these exercises, remember:   Muscles can gain both the endurance and the strength needed for everyday activities through controlled exercises.   Complete these exercises as instructed by your physician, physical therapist or athletic trainer. Progress the resistance and repetitions only as guided.   You may experience muscle soreness or fatigue, but the pain or discomfort you are trying to eliminate should never worsen during these exercises. If this pain does worsen, stop and make certain you are following the directions exactly. If the pain is still present after adjustments, discontinue the exercise until you can discuss the trouble with your clinician.  STRENGTH - Dorsiflexors  Secure a rubber exercise band/tubing to a fixed object (table, pole) and loop the other end around your right / left foot.   Sit on the floor facing the fixed object. The band/tubing should be slightly tense when your foot is relaxed.   Slowly draw your foot back toward you using your ankle and toes.   Hold this position for __________ seconds. Slowly release the tension in the band and return your foot to the starting position.  Repeat __________ times. Complete this exercise __________ times per day.  STRENGTH - Plantar-flexors  Sit with your right / left leg extended. Holding onto both ends of a rubber exercise band/tubing, loop it around the ball of your foot. Keep a slight tension in the band.   Slowly push your toes  away from you, pointing them downward.   Hold this position for __________ seconds. Return slowly, controlling the tension in the band/tubing.  Repeat __________ times. Complete this exercise __________ times per day.  STRENGTH - Plantar-flexors, Standing   Stand with your feet shoulder width apart. Steady yourself with a wall or table using as little support as needed.   Keeping your weight evenly  spread over the width of your feet, rise up on your toes.*   Hold this position for __________ seconds.  Repeat __________ times. Complete this exercise __________ times per day.  *If this is too easy, shift your weight toward your right / left leg until you feel challenged. Ultimately, you may be asked to do this exercise with your right / left foot only. STRENGTH - Ankle Eversion  Secure one end of a rubber exercise band/tubing to a fixed object (table, pole). Loop the other end around your foot just before your toes.   Place your fists between your knees. This will focus your strengthening at your ankle.   Drawing the band/tubing across your opposite foot, slowly, pull your little toe out and up. Make sure the band/tubing is positioned to resist the entire motion.   Hold this position for __________ seconds.   Have your muscles resist the band/tubing as it slowly pulls your foot back to the starting position.  Repeat __________ times. Complete this exercise __________ times per day.  STRENGTH - Ankle Inversion  Secure one end of a rubber exercise band/tubing to a fixed object (table, pole). Loop the other end around your foot just before your toes.   Place your fists between your knees. This will focus your strengthening at your ankle.   Slowly, pull your big toe up and in, making sure the band/tubing is positioned to resist the entire motion.   Hold this position for __________ seconds.   Have your muscles resist the band/tubing as it slowly pulls your foot back to the starting position.  Repeat __________ times. Complete this exercises __________ times per day.  STRENGTH - Towel Curls  Sit in a chair positioned on a non-carpeted surface.   Place your foot on a towel, keeping your heel on the floor.   Pull the towel toward your heel by only curling your toes. Keep your heel on the floor.   If instructed by your physician, physical therapist or athletic trainer, add weight to  the end of the towel.  Repeat __________ times. Complete this exercise __________ times per day. STRENGTH - Dorsiflexors and Plantar-flexors, Heel/toe Walking  Dorsiflexion: Walk on your heels only. Keep your toes as high as possible.   Repeat __________ times. Complete __________ times per day.   Plantar flexion: Walk on your toes only. Keep your heels as high as possible.   Walk for ____________________ seconds/feet.  Repeat __________ times. Complete __________ times per day.  BALANCE - Tandem Walking  Place your uninjured foot on a line 2-4 inches wide and at least 10 feet long.   Keeping your balance without using anything for extra support, place your right / left heel directly in front of your other foot.   Slowly raise your back foot up, lifting from the heel to the toes, and place it directly in front of the right / left foot.   Continue to walk along the line slowly. Walk for ____________________ feet.  Repeat ____________________ times. Complete ____________________ times per day. BALANCE - Inversion/Eversion Use caution, these are advanced level exercises. Do not  begin them until you are advised to do so.   Create a balance board using a sturdy board about 1  feet long and at 1-1  feet wide and a 1  inch diameter rod or pipe that is as long as the board's width. A copper pipe or a solid broomstick work well.   Stand on a non-carpeted surface near a countertop or wall. Step onto the board so that your feet are hip-width apart and equally straddle the rod/pipe.   Keeping your feet in place, complete these two exercises without shifting your upper body or hips:   Tip the board from side-to-side. Control the movement so the board does not forcefully strike the ground. The board should silently tap the ground.   Tip the board side-to-side without striking the ground. Occasionally pause and maintain a steady position at various points.   Repeat the first two exercises, but  use only your right / left foot. Place your right / left foot directly over the rod/pipe.  Repeat __________ times. Complete this exercise __________ times a day. BALANCE - Plantar/Dorsi Flexion Use caution, these are advanced level exercises. Do not begin them until you are advised to do so.  Create a balance board using a sturdy board about 1  feet long and at 1-1  feet wide and a 1  inch diameter rod or pipe that is as long as the board's width. A copper pipe or a solid broomstick work well.   Stand on a non-carpeted surface near a countertop or wall. Stand on the board so that the rod/pipe runs under the arches in your feet.   Keeping your feet in place, complete these two exercises without shifting your upper body or hips:   Tip the board from side-to-side. Control the movement so the board does not forcefully strike the ground. The board should silently tap the ground.   Tip the board side-to-side without striking the ground. Occasionally pause and maintain a steady position at various points.   Repeat the first two exercises, but use only your right / left foot. Stand in the center of the board.  Repeat __________ times. Complete this exercise __________ times a day. STRENGTH - Plantar-flexors, Eccentric Note: This exercise can place a lot of stress on your foot and ankle. Please complete this exercise only if specifically instructed by your caregiver.   Place the balls of your feet on a step. With your hands, use only enough support from a wall or rail to keep your balance.   Keep your knees straight and rise up on your toes.   Slowly shift your weight entirely to your toes and pick up your opposite foot. Gently and with controlled movement, lower your weight through your right / left foot so that your heel drops below the level of the step. You will feel a slight stretch in the back of your calf at the ending position.   Use the healthy leg to help rise up onto the balls of both  feet, then lower weight only on the right / left leg again. Build up to 15 repetitions. Then progress to 3 consecutive sets of 15 repetitions.*   After completing the above exercise, complete the same exercise with a slight knee bend (about 30 degrees). Again, build up to 15 repetitions. Then progress to 3 consecutive sets of 15 repetitions.*  Perform this exercise __________ times per day.  *When you easily complete 3 sets of 15, your physician, physical therapist or  athletic trainer may advise you to add resistance by wearing a backpack filled with additional weight. Document Released: 12/03/2004 Document Revised: 01/14/2011 Document Reviewed: 08/16/2008 [facility] Patient Information 2012 [place], [place].Chronic Ankle Instability with Rehab Chronic ankle instability is characterized by instability of the ankle for a prolonged period of time. There are two types of ankle instability.   A functionally unstable ankle is one that gives way; however, it may or may not be loose.   A mechanically unstable ankle is one that is loose due to a problem with the ligaments. However, not all loose ankles are unstable or give way.  SYMPTOMS   Recurrent ankle pain and giving way of the ankle.   Difficulty running on uneven surfaces, jumping, or changing directions while running (cutting).   Pain, tenderness, swelling, and bruising at the site of injury.   Weakness or looseness in the ankle joint.   Occasionally, impaired ability to walk soon after injury.  CAUSES   Ankle instability is most commonly caused by a previous ankle injury that did not completely heal.   Ankle instability may also be caused by stress imposed from either side of the ankle joint that can temporarily force or pry the ankle bone (talus) out of its normal alignment. The ligaments that hold the joint in place are stretched and torn.  RISK INCREASES WITH:  Previous ankle injury.   You were born with (congenital) joint looseness.    Too-rapid return to activity after previous ankle sprain.   Activities in which the foot may land sideways while running, walking, and jumping (basketball, volleyball, or soccer) or walking or running on uneven or rough surfaces.   Inadequate ankle support during athletics.   Poor strength and flexibility.   Poor balance skills.  PREVENTION  Warm up and stretch properly before activity.   Maintain physical fitness:   Ankle and leg flexibility, muscle strength, and endurance.   Balanced training activities.   Cardiovascular fitness.   Learn and use proper technique during sports and have a coach correct improper technique.   Taping, protective strapping, bracing, or high-top tennis shoes may be used. Initially, tape is best; however, it loses most of its support function within 10 to 15 minutes.   Wear proper protective shoes (high-top shoes with taping or bracing).   Provide the ankle with support during sports and practice activities for 12 months following injury.   Complete rehabilitation after initial injury.  PROGNOSIS  If treated properly, ankle instability normally resolves with non-surgical treatment. However, for certain cases of mechanical instability surgery is necessary. RELATED COMPLICATIONS   Frequent recurrence of symptoms is possible. Following rehabilitation guidelines correctly decreases the frequency of recurrence and optimizes healing time.   Injury to other structures (bone, cartilage, or tendon).   Chronically unstable or arthritic ankle joint.   Complications of surgery including infection, bleeding, injury to nerves, continued giving way, ankle stiffness, and ankle weakness.  TREATMENT Treatment initially involves ice, medication, and compression bandages are used to help reduce pain and inflammation, It may be necessary to immobilize the joint for a period of time to allow for healing. Strengthening and stretching exercises are recommended after  immobilization to help regain strength and flexibility. These exercises may be completed at home or with a therapist. Some individuals find placing a heel wedge in the shoe, taping or bracing, and wearing high-top shoes helpful. If symptoms last for longer than 3 months, despite treatment, then surgery may be recommended. HEAT AND COLD  Cold  treatment (icing) relieves pain and reduces inflammation. Cold treatment should be applied for 10 to 15 minutes every 2 to 3 hours for inflammation and pain and immediately after any activity that aggravates your symptoms. Use ice packs or an ice massage.   Heat treatment may be used prior to performing the stretching and strengthening activities prescribed by your caregiver, physical therapist, or athletic trainer. Use a heat pack or a warm soak.  MEDICATION   There are no specific medications to improve the stability of your ankle.   If pain medication is necessary, then nonsteroidal anti-inflammatory medications, such as aspirin and ibuprofen, or other minor pain relievers, such as acetaminophen, are often recommended.   Do not take pain medication within 7 days before surgery.   Prescription pain relievers may be prescribed if deemed necessary by your caregiver. Use only as directed and only as much as you need.   Ointments applied to the skin may be helpful.  SEEK MEDICAL CARE IF:   Pain, swelling, or bruising worsens despite treatment.   You develop locking or catching in the ankle.   You have pain, numbness, or coldness in the foot.   You develop giving way of the ankle which persists after 3 to 6 months of rehabilitation.  EXERCISES  RANGE OF MOTION AND STRETCHING EXERCISES - Ankle Instability, Chronic, Non-Surgical Intervention Since ankles demonstrate instability when they have too much motion throughout the joints, range of motion and stretching exercises are not helpful and can even be harmful. Only complete range of motion and stretching  exercises for your ankle if instructed by your physician, physical therapist or athletic trainer. An effective rehabilitation program for unstable ankles will include mostly strengthening and balance exercises. STRENGTHENING EXERCISES - Ankle Instability, Chronic, Non-Surgical Intervention  These exercises may help you when beginning to rehabilitate your injury. They may resolve your symptoms with or without further involvement from your physician, physical therapist or athletic trainer. While completing these exercises, remember:   Muscles can gain both the endurance and the strength needed for everyday activities through controlled exercises.   Complete these exercises as instructed by your physician, physical therapist or athletic trainer. Progress the resistance and repetitions only as guided.   You may experience muscle soreness or fatigue, but the pain or discomfort you are trying to eliminate should never worsen during these exercises. If this pain does worsen, stop and make certain you are following the directions exactly. If the pain is still present after adjustments, discontinue the exercise until you can discuss the trouble with your clinician.  STRENGTH - Dorsiflexors  Secure a rubber exercise band/tubing to a fixed object (table, pole) and loop the other end around your right / left foot.   Sit on the floor facing the fixed object. The band/tubing should be slightly tense when your foot is relaxed.   Slowly draw your foot back toward you using your ankle and toes.   Hold this position for __________ seconds. Slowly release the tension in the band and return your foot to the starting position.  Repeat __________ times. Complete this exercise __________ times per day.  STRENGTH - Plantar-flexors  Sit with your right / left leg extended. Holding onto both ends of a rubber exercise band/tubing, loop it around the ball of your foot. Keep a slight tension in the band.   Slowly push  your toes away from you, pointing them downward.   Hold this position for __________ seconds. Return slowly, controlling the tension in the  band/tubing.  Repeat __________ times. Complete this exercise __________ times per day.  STRENGTH - Plantar-flexors, Standing   Stand with your feet shoulder width apart. Steady yourself with a wall or table using as little support as needed.   Keeping your weight evenly spread over the width of your feet, rise up on your toes.*   Hold this position for __________ seconds.  Repeat __________ times. Complete this exercise __________ times per day.  *If this is too easy, shift your weight toward your right / left leg until you feel challenged. Ultimately, you may be asked to do this exercise with your right / left foot only. STRENGTH - Ankle Eversion  Secure one end of a rubber exercise band/tubing to a fixed object (table, pole). Loop the other end around your foot just before your toes.   Place your fists between your knees. This will focus your strengthening at your ankle.   Drawing the band/tubing across your opposite foot, slowly, pull your little toe out and up. Make sure the band/tubing is positioned to resist the entire motion.   Hold this position for __________ seconds.   Have your muscles resist the band/tubing as it slowly pulls your foot back to the starting position.  Repeat __________ times. Complete this exercise __________ times per day.  STRENGTH - Ankle Inversion  Secure one end of a rubber exercise band/tubing to a fixed object (table, pole). Loop the other end around your foot just before your toes.   Place your fists between your knees. This will focus your strengthening at your ankle.   Slowly, pull your big toe up and in, making sure the band/tubing is positioned to resist the entire motion.   Hold this position for __________ seconds.   Have your muscles resist the band/tubing as it slowly pulls your foot back to the  starting position.  Repeat __________ times. Complete this exercises __________ times per day.  STRENGTH - Towel Curls  Sit in a chair positioned on a non-carpeted surface.   Place your foot on a towel, keeping your heel on the floor.   Pull the towel toward your heel by only curling your toes. Keep your heel on the floor.   If instructed by your physician, physical therapist or athletic trainer, add weight to the end of the towel.  Repeat __________ times. Complete this exercise __________ times per day. STRENGTH - Dorsiflexors and Plantar-flexors, Heel/toe Walking  Dorsiflexion: Walk on your heels only. Keep your toes as high as possible.   Repeat __________ times. Complete __________ times per day.   Plantar flexion: Walk on your toes only. Keep your heels as high as possible.   Walk for ____________________ seconds/feet.  Repeat __________ times. Complete __________ times per day.  BALANCE - Tandem Walking  Place your uninjured foot on a line 2-4 inches wide and at least 10 feet long.   Keeping your balance without using anything for extra support, place your right / left heel directly in front of your other foot.   Slowly raise your back foot up, lifting from the heel to the toes, and place it directly in front of the right / left foot.   Continue to walk along the line slowly. Walk for ____________________ feet.  Repeat ____________________ times. Complete ____________________ times per day. BALANCE - Inversion/Eversion Use caution, these are advanced level exercises. Do not begin them until you are advised to do so.   Create a balance board using a sturdy board about 1  feet long and at 1-1  feet wide and a 1  inch diameter rod or pipe that is as long as the board's width. A copper pipe or a solid broomstick work well.   Stand on a non-carpeted surface near a countertop or wall. Step onto the board so that your feet are hip-width apart and equally straddle the rod/pipe.    Keeping your feet in place, complete these two exercises without shifting your upper body or hips:   Tip the board from side-to-side. Control the movement so the board does not forcefully strike the ground. The board should silently tap the ground.   Tip the board side-to-side without striking the ground. Occasionally pause and maintain a steady position at various points.   Repeat the first two exercises, but use only your right / left foot. Place your right / left foot directly over the rod/pipe.  Repeat __________ times. Complete this exercise __________ times a day. BALANCE - Plantar/Dorsi Flexion Use caution, these are advanced level exercises. Do not begin them until you are advised to do so.  Create a balance board using a sturdy board about 1  feet long and at 1-1  feet wide and a 1  inch diameter rod or pipe that is as long as the board's width. A copper pipe or a solid broomstick work well.   Stand on a non-carpeted surface near a countertop or wall. Stand on the board so that the rod/pipe runs under the arches in your feet.   Keeping your feet in place, complete these two exercises without shifting your upper body or hips:   Tip the board from side-to-side. Control the movement so the board does not forcefully strike the ground. The board should silently tap the ground.   Tip the board side-to-side without striking the ground. Occasionally pause and maintain a steady position at various points.   Repeat the first two exercises, but use only your right / left foot. Stand in the center of the board.  Repeat __________ times. Complete this exercise __________ times a day. STRENGTH - Plantar-flexors, Eccentric Note: This exercise can place a lot of stress on your foot and ankle. Please complete this exercise only if specifically instructed by your caregiver.   Place the balls of your feet on a step. With your hands, use only enough support from a wall or rail to keep your  balance.   Keep your knees straight and rise up on your toes.   Slowly shift your weight entirely to your toes and pick up your opposite foot. Gently and with controlled movement, lower your weight through your right / left foot so that your heel drops below the level of the step. You will feel a slight stretch in the back of your calf at the ending position.   Use the healthy leg to help rise up onto the balls of both feet, then lower weight only on the right / left leg again. Build up to 15 repetitions. Then progress to 3 consecutive sets of 15 repetitions.*   After completing the above exercise, complete the same exercise with a slight knee bend (about 30 degrees). Again, build up to 15 repetitions. Then progress to 3 consecutive sets of 15 repetitions.*  Perform this exercise __________ times per day.  *When you easily complete 3 sets of 15, your physician, physical therapist or athletic trainer may advise you to add resistance by wearing a backpack filled with additional weight. Document Released: 12/03/2004 Document Revised: 01/14/2011  Document Reviewed: 08/16/2008 [facility] Patient Information 2012 [place], [place].

## 2011-07-05 NOTE — ED Provider Notes (Signed)
History     CSN: [id number]  Arrival date & time 07/05/11  1324   First MD Initiated Contact with Patient 07/05/11 1405      Chief Complaint  Patient presents with  . Fall  . Back Pain    (Consider location/radiation/quality/duration/timing/severity/associated sxs/prior treatment) Patient is a 41 y.o. female presenting with fall and back pain. The history is provided by the patient. No language interpreter was used.  Fall The accident occurred 2 days ago. She fell from a height of 3 to 5 ft. She landed on a hard floor.  Back Pain    Patient coming in today with complaint of lower back pain and right ankle and foot pain after falling down 3 steps 2 days ago. She has had prior surgery on her right ankle. She has no radiating pain down into her buttocks from her back. States that the pain in her ankle radiates up into her leg. States that she has been limping on her leg for 2 days. She does have point tenderness to the lumbar spine. Pedal pulse 2+ on the right. Good sensation to the right foot and toes. No deformity noted.  Past Medical History  Diagnosis Date  . Hypertension   . Anxiety   . Panic attack   . TMJ disease     Past Surgical History  Procedure Date  . Eye surgery   . Dilitation and curettage   . Ankle surgery     Family History  Problem Relation Age of Onset  . Hypertension Other     History  Substance Use Topics  . Smoking status: Current Everyday Smoker  . Smokeless tobacco: Not on file  . Alcohol Use: No    OB History    Grav Para Term Preterm Abortions TAB SAB Ect Mult Living                  Review of Systems  Musculoskeletal: Positive for back pain and joint swelling.       [place]  All other systems reviewed and are negative.    Allergies  Morphine sulfate; Other; and Paroxetine  Home Medications   Current Outpatient Rx  Name Route Sig Dispense Refill  . IBUPROFEN 200 MG PO TABS Oral Take 800 mg by mouth every 6 (six) hours as  needed. For pain     . LISINOPRIL 10 MG PO TABS Oral Take 10 mg by mouth daily.      [name] LORAZEPAM 1 MG PO TABS Oral Take 1 mg by mouth 2 (two) times daily.      BP 143/70  Pulse 62  Temp(Src) 97.6 F (36.4 C) (Oral)  Resp 18  SpO2 100%  LMP 06/25/2011  Physical Exam  Nursing note and vitals reviewed. Constitutional: She is oriented to person, place, and time. She appears well-developed and well-nourished.       Obese   HENT:  Head: Normocephalic and atraumatic.  Eyes: Conjunctivae and EOM are normal. Pupils are equal, round, and reactive to light.  Neck: Normal range of motion. Neck supple.  Cardiovascular: Normal rate, regular rhythm, normal heart sounds and intact distal pulses.  Exam reveals no gallop and no friction rub.   No murmur heard. Pulmonary/Chest: Effort normal and breath sounds normal.  Abdominal: Soft. Bowel sounds are normal.  Musculoskeletal: Normal range of motion. She exhibits edema and tenderness.       R ankle edema and tenderness. Point tenderness to lumbar spine  Neurological: She is alert and oriented  to person, place, and time. She has normal reflexes.  Skin: Skin is warm and dry.  Psychiatric: She has a normal mood and affect.    ED Course  Procedures (including critical care time)  Labs Reviewed - No data to display No results found.   No diagnosis found.    MDM   41 year old patient coming in today complaining of lower back pain nonradiating and right ankle foot pain since Friday when she fell down 3 steps. No deformity noted to the ankle or foot. Plain films showed no fractures. Patient was given crutches narcotic pain medicine and Flexeril. She is going to followup with Dr. Leslee Home as needed.       Jethro Bastos, NP 07/05/11 1622

## 2011-07-05 NOTE — ED Notes (Signed)
Called ortho tech to request crutches and aso splint.

## 2011-07-10 ENCOUNTER — Encounter (HOSPITAL_COMMUNITY): Payer: Self-pay

## 2011-07-10 ENCOUNTER — Emergency Department (HOSPITAL_COMMUNITY): Payer: Self-pay

## 2011-07-10 ENCOUNTER — Emergency Department (HOSPITAL_COMMUNITY)
Admission: EM | Admit: 2011-07-10 | Discharge: 2011-07-10 | Disposition: A | Discharge status: Home / Self Care | Payer: Self-pay | Attending: Emergency Medicine | Admitting: Emergency Medicine

## 2011-07-10 DIAGNOSIS — W19XXXA Unspecified fall, initial encounter: Secondary | ICD-10-CM | POA: Insufficient documentation

## 2011-07-10 DIAGNOSIS — M549 Dorsalgia, unspecified: Secondary | ICD-10-CM | POA: Insufficient documentation

## 2011-07-10 DIAGNOSIS — Z79899 Other long term (current) drug therapy: Secondary | ICD-10-CM | POA: Insufficient documentation

## 2011-07-10 DIAGNOSIS — M25579 Pain in unspecified ankle and joints of unspecified foot: Secondary | ICD-10-CM | POA: Insufficient documentation

## 2011-07-10 DIAGNOSIS — S93409A Sprain of unspecified ligament of unspecified ankle, initial encounter: Secondary | ICD-10-CM | POA: Insufficient documentation

## 2011-07-10 DIAGNOSIS — I10 Essential (primary) hypertension: Secondary | ICD-10-CM | POA: Insufficient documentation

## 2011-07-10 MED ORDER — OXYCODONE-ACETAMINOPHEN 5-325 MG PO TABS: 1.0000 | ORAL_TABLET | Freq: Four times a day (QID) | ORAL | Status: AC | PRN

## 2011-07-10 MED ORDER — OXYCODONE-ACETAMINOPHEN 5-325 MG PO TABS
1.0000 | ORAL_TABLET | Freq: Once | ORAL | Status: AC
  Administered 2011-07-10: 1 via ORAL
  Filled 2011-07-10: qty 1

## 2011-07-10 NOTE — ED Notes (Signed)
Pt had a fall a couple of weeks ago and had severe right ankle pain. She was referred to an ortho md but states that they required a high copay before they would see her. She states that the pain has not improved and she has not been able to put much weight on the ankle when walking. Pt has taken all of prescribed meds and is still having pain.

## 2011-07-10 NOTE — Discharge Instructions (Signed)
Ankle Sprain °An ankle sprain is an injury to the ligaments that hold the ankle joint together.  °CAUSES °The injury is usually caused by a fall or by twisting the ankle. It is important to tell your caregiver how the injury occurred and whether or not you were able to walk immediately after the injury.  °SYMPTOMS  °Pain is the primary symptom. It may be present at rest or only when you are trying to stand or walk. The ankle will likely be swollen. Bruising may develop immediately or after 1 or 2 days. It may be difficult or impossible to stand or walk. This depends on the severity of the sprain. °DIAGNOSIS  °Your caregiver can determine if a sprain has occurred based on the accident details and on examination of your ankle. Examination will include pressing and squeezing areas of the foot and ankle. Your caregiver will try to move the ankle in certain ways. X-rays may be used to be sure a bone was not broken, or that the ligament did not pull off of a bone (avulsion). There are standard guidelines that can reliably determine if an X-ray is needed. °TREATMENT  °Rest, ice, elevation, and compression are the basic modes of treatment. Certain types of braces can help stabilize the ankle and allow early return to walking. Your caregiver can make a recommendation for this. Medication may be recommended for pain. You may be referred to an orthopedist or a physical therapist for certain types of severe sprains. °HOME CARE INSTRUCTIONS  °· Apply ice to the sore area for 15 to 20 minutes, 3 to 4 times per day. Do this while you are awake for the first 2 days, or as directed. This can be stopped when the swelling goes away. Put the ice in a plastic bag and place a towel between the bag of ice and your skin.  °· Keep your leg elevated when possible to lessen swelling.  °· If your caregiver recommends crutches, use them as instructed with a non-weight bearing cast for 1 week. Then, you may walk on your ankle as the pain allows,  or as instructed. Gradually, put weight on the affected ankle. Continue to use crutches or a cane until you can walk without causing pain.  °· If a plaster splint was applied, wear the splint until you are seen for a follow-up examination. Rest it on nothing harder than a pillow the first 24 hours. Do not put weight on it. Do not get it wet. You may take it off to take a shower or bath.  °· You may have been given an elastic bandage to use with the plaster splint, or you may have been given a elastic bandage to use alone. The elastic bandage is too tight if you have numbness, tingling, or if your foot becomes cold and blue. Adjust the bandage to make it comfortable.  °· If an air splint was applied, you may blow more air into it or take some out to make it more comfortable. You may take it off at night and to take a shower or bath. Wiggle your toes in the splint several times per day if you are able.  °· Only take over-the-counter or prescription medicines for pain, discomfort, or fever as directed by your caregiver.  °· Do not drive a vehicle until your caregiver specifically tells you it is safe to do so.  °SEEK MEDICAL CARE IF:  °· You have an increase in bruising, swelling, or pain.  °· Your   toes feel cold.  °· Pain relief is not achieved with medications.  °SEEK IMMEDIATE MEDICAL CARE IF: °Your toes are numb or blue or you have severe pain. °MAKE SURE YOU:  °· Understand these instructions.  °· Will watch your condition.  °· Will get help right away if you are not doing well or get worse.  °Document Released: 05/04/2005 Document Revised: 08/08/2010 Document Reviewed: 12/07/2007 °[employer]® Patient Information ©2012 [employer]. °

## 2011-07-10 NOTE — ED Provider Notes (Signed)
History     CSN: [id number]  Arrival date & time 07/10/11  1443   First MD Initiated Contact with Patient 07/10/11 1452      Chief Complaint  Patient presents with  . Ankle Pain    (Consider location/radiation/quality/duration/timing/severity/associated sxs/prior treatment) Patient is a 41 y.o. female presenting with ankle pain. The history is provided by the patient.  Ankle Pain  Pertinent negatives include no numbness.   patient fell and twisted her right ankle about a week ago. She seen in the ED 5 days ago and had negative x-ray. She continues to have pain. She states that she cannot follow with orthopedic surgery does take charge money to see her. She states they told her to come back to the ER. No new injury. She states she had a previous injury to that ankle that required surgical repair by Dr. Sedonia Small. She states now the pain is working its way vallecula but warm.  Past Medical History  Diagnosis Date  . Hypertension   . Anxiety   . Panic attack   . TMJ disease     Past Surgical History  Procedure Date  . Eye surgery   . Dilitation and curettage   . Ankle surgery     Family History  Problem Relation Age of Onset  . Hypertension Other     History  Substance Use Topics  . Smoking status: Current Everyday Smoker  . Smokeless tobacco: Not on file  . Alcohol Use: No    OB History    Grav Para Term Preterm Abortions TAB SAB Ect Mult Living                  Review of Systems  Musculoskeletal: Positive for back pain.       Right ankle pain  Neurological: Negative for weakness and numbness.    Allergies  Other and Paroxetine  Home Medications   Current Outpatient Rx  Name Route Sig Dispense Refill  . IBUPROFEN 200 MG PO TABS Oral Take 800 mg by mouth every 6 (six) hours as needed. For pain     . LISINOPRIL 10 MG PO TABS Oral Take 10 mg by mouth daily.      [name] LORAZEPAM 1 MG PO TABS Oral Take 1 mg by mouth 2 (two) times daily.    .  OXYCODONE-ACETAMINOPHEN 5-325 MG PO TABS Oral Take 2 tablets by mouth every 4 (four) hours as needed. For pain    . CYCLOBENZAPRINE HCL 10 MG PO TABS Oral Take 1 tablet (10 mg total) by mouth 3 (three) times daily as needed for muscle spasms. 15 tablet 0  . OXYCODONE-ACETAMINOPHEN 5-325 MG PO TABS Oral Take 1-2 tablets by mouth every 6 (six) hours as needed for pain. 15 tablet 0    BP 124/86  Pulse 86  Temp(Src) 98.5 F (36.9 C) (Oral)  Resp 20  SpO2 99%  LMP 07/05/2011  Physical Exam  Constitutional: She appears well-developed and well-nourished.  Musculoskeletal:       Mild tenderness left ankle laterally over malleolus. Neurovascular intact. Ankle appears stable    ED Course  Procedures (including critical care time)  Labs Reviewed - No data to display Dg Ankle Complete Right  07/10/2011  *RADIOLOGY REPORT*  Clinical Data: Larey Seat.  Previous ORIF.  Persistent pain.  RIGHT ANKLE - COMPLETE 3+ VIEW  Comparison: 07/05/2011  Findings: There is been previous ORIF of a distal fibular and medial malleolar fractures.  Fractures appear healed.  There are some  degenerative changes of the distal tibiofibular articulation and at the ankle joint itself.  I question if there is an intra- articular loose body anteriorly or if this represents irregular healed bone.  There is a plantar calcaneal spur.  There are degenerative changes in the midfoot.  IMPRESSION: No acute injury evident.  Previous ORIF of distal fibular and medial malleolar fractures.  Degenerative changes in the ankle region as above.  Question intra-articular loose body anteriorly.  Original Report Authenticated By: Thomasenia Sales, M.D.     1. Ankle sprain       MDM  Ankle pain after injury. Previous severe injury to the ankle. Patient states she cannot afford a followup with orthopedic surgeon. X-ray was repeated to the interval. No acute fracture seen, there is a loose body. She's given a refill on pain medicine and on followup  with ortho        Juliet Rude. Rubin Payor, MD 07/10/11 [phone]

## 2011-08-30 ENCOUNTER — Encounter (HOSPITAL_COMMUNITY): Payer: Self-pay | Admitting: *Deleted

## 2011-08-30 ENCOUNTER — Emergency Department (HOSPITAL_COMMUNITY): Payer: Self-pay

## 2011-08-30 ENCOUNTER — Emergency Department (HOSPITAL_COMMUNITY)
Admission: EM | Admit: 2011-08-30 | Discharge: 2011-08-30 | Disposition: A | Discharge status: Home / Self Care | Payer: Self-pay | Attending: Emergency Medicine | Admitting: Emergency Medicine

## 2011-08-30 DIAGNOSIS — F411 Generalized anxiety disorder: Secondary | ICD-10-CM | POA: Insufficient documentation

## 2011-08-30 DIAGNOSIS — R209 Unspecified disturbances of skin sensation: Secondary | ICD-10-CM | POA: Insufficient documentation

## 2011-08-30 DIAGNOSIS — F17200 Nicotine dependence, unspecified, uncomplicated: Secondary | ICD-10-CM | POA: Insufficient documentation

## 2011-08-30 DIAGNOSIS — S92919A Unspecified fracture of unspecified toe(s), initial encounter for closed fracture: Secondary | ICD-10-CM | POA: Insufficient documentation

## 2011-08-30 DIAGNOSIS — I10 Essential (primary) hypertension: Secondary | ICD-10-CM | POA: Insufficient documentation

## 2011-08-30 DIAGNOSIS — IMO0002 Reserved for concepts with insufficient information to code with codable children: Secondary | ICD-10-CM | POA: Insufficient documentation

## 2011-08-30 DIAGNOSIS — Z79899 Other long term (current) drug therapy: Secondary | ICD-10-CM | POA: Insufficient documentation

## 2011-08-30 DIAGNOSIS — S92502A Displaced unspecified fracture of left lesser toe(s), initial encounter for closed fracture: Secondary | ICD-10-CM

## 2011-08-30 DIAGNOSIS — M79609 Pain in unspecified limb: Secondary | ICD-10-CM | POA: Insufficient documentation

## 2011-08-30 DIAGNOSIS — K219 Gastro-esophageal reflux disease without esophagitis: Secondary | ICD-10-CM | POA: Insufficient documentation

## 2011-08-30 HISTORY — DX: Gastro-esophageal reflux disease without esophagitis: K21.9

## 2011-08-30 MED ORDER — OXYCODONE-ACETAMINOPHEN 5-325 MG PO TABS: 1.0000 | ORAL_TABLET | Freq: Four times a day (QID) | ORAL | Status: AC | PRN

## 2011-08-30 MED ORDER — OXYCODONE-ACETAMINOPHEN 5-325 MG PO TABS
1.0000 | ORAL_TABLET | Freq: Once | ORAL | Status: AC
  Administered 2011-08-30: 1 via ORAL
  Filled 2011-08-30: qty 1

## 2011-08-30 NOTE — ED Notes (Signed)
Patient transported to X-ray 

## 2011-08-30 NOTE — ED Provider Notes (Signed)
History     CSN: [id number]  Arrival date & time 08/30/11  1308   First MD Initiated Contact with Patient 08/30/11 1101      Chief Complaint  Patient presents with  . Foot Injury    (Consider location/radiation/quality/duration/timing/severity/associated sxs/prior treatment) Patient is a 41 y.o. female presenting with foot injury. The history is provided by the patient.  Foot Injury  Pertinent negatives include no numbness.   patient states that early this morning she was walking in the room and hit her left foot on some furniture. She's had pain in her toes since. She states she's unable to sleep. No other injury. No fall. No numbness or weakness.  Past Medical History  Diagnosis Date  . Hypertension   . Anxiety   . Panic attack   . TMJ disease   . Acid reflux     Past Surgical History  Procedure Date  . Eye surgery   . Dilitation and curettage   . Ankle surgery     Family History  Problem Relation Age of Onset  . Hypertension Other     History  Substance Use Topics  . Smoking status: Current Everyday Smoker  . Smokeless tobacco: Not on file  . Alcohol Use: No    OB History    Grav Para Term Preterm Abortions TAB SAB Ect Mult Living                  Review of Systems  Neurological: Negative for weakness and numbness.    Allergies  Other and Paroxetine  Home Medications   Current Outpatient Rx  Name Route Sig Dispense Refill  . ALBUTEROL SULFATE HFA 108 (90 BASE) MCG/ACT IN AERS Inhalation Inhale 2 puffs into the lungs every 4 (four) hours as needed. pleuracy    . ALPRAZOLAM 1 MG PO TABS Oral Take 1 mg by mouth 3 (three) times daily.    . IBUPROFEN 200 MG PO TABS Oral Take 800 mg by mouth every 6 (six) hours as needed. For pain    . LISINOPRIL 10 MG PO TABS Oral Take 10 mg by mouth daily.      . OXYCODONE-ACETAMINOPHEN 5-325 MG PO TABS Oral Take 1-2 tablets by mouth every 6 (six) hours as needed for pain. 20 tablet 0    BP 132/77  Pulse 88   Temp(Src) 98.5 F (36.9 C) (Oral)  Resp 18  SpO2 100%  LMP 08/29/2011  Physical Exam  Vitals reviewed. Constitutional: She appears well-developed and well-nourished.  Musculoskeletal: She exhibits tenderness. She exhibits no edema.       Tenderness to left third toe. Pain proximally. Skin intact. Slight decrease in his sensation over left second toe.  Neurological: She is alert.    ED Course  Procedures (including critical care time)  Labs Reviewed - No data to display Dg Foot Complete Left  08/30/2011  *RADIOLOGY REPORT*  Clinical Data: Left foot injury with pain.  LEFT FOOT - COMPLETE 3+ VIEW  Comparison: None  Findings: An oblique fracture of the third toe proximal phalanx is noted without significant displacement or angulation. No other acute fracture, subluxation or dislocation identified. The Lisfranc joints are intact. A remote fifth metatarsal fracture is present. A calcaneal spur is identified.  IMPRESSION: Essentially nondisplaced fracture of the third toe proximal phalanx.  Original Report Authenticated By: Rosendo Gros, M.D.     1. Fracture of third toe, left, closed       MDM  Closed fracture proximal phalanx  of left third toe. Minimally displaced. Patient had her toe buddy taped and was given a hard sole shoe. She'll followup with ortho.        Juliet Rude. Rubin Payor, MD 08/30/11 1152

## 2011-08-30 NOTE — ED Notes (Signed)
Got up this am walking through room, hit left foot on furniture. Having pain to bottom of foot and toes.

## 2011-08-30 NOTE — Discharge Instructions (Signed)
Toe Fracture Your caregiver has diagnosed you as having a fractured toe. A toe fracture is a break in the bone of a toe. "Buddy taping" is a way of splinting your broken toe, by taping the broken toe to the toe next to it. This "buddy taping" will keep the injured toe from moving beyond normal range of motion. Buddy taping also helps the toe heal in a more normal alignment. It may take 6 to 8 weeks for the toe injury to heal. HOME CARE INSTRUCTIONS   Leave your toes taped together for as long as directed by your caregiver or until you see a doctor for a follow-up examination. You can change the tape after bathing. Always use a small piece of gauze or cotton between the toes when taping them together. This will help the skin stay dry and prevent infection.   Apply ice to the injury for 15 to 20 minutes each hour while awake for the first 2 days. Put the ice in a plastic bag and place a towel between the bag of ice and your skin.   After the first 2 days, apply heat to the injured area. Use heat for the next 2 to 3 days. Place a heating pad on the foot or soak the foot in warm water as directed by your caregiver.   Keep your foot elevated as much as possible to lessen swelling.   Wear sturdy, supportive shoes. The shoes should not pinch the toes or fit tightly against the toes.   Your caregiver may prescribe a rigid shoe if your foot is very swollen.   Your may be given crutches if the pain is too great and it hurts too much to walk.   Only take over-the-counter or prescription medicines for pain, discomfort, or fever as directed by your caregiver.   If your caregiver has given you a follow-up appointment, it is very important to keep that appointment. Not keeping the appointment could result in a chronic or permanent injury, pain, and disability. If there is any problem keeping the appointment, you must call back to this facility for assistance.  SEEK MEDICAL CARE IF:   You have increased pain  or swelling, not relieved with medications.   The pain does not get better after 1 week.   Your injured toe is cold when the others are warm.  SEEK IMMEDIATE MEDICAL CARE IF:   The toe becomes cold, numb, or white.   The toe becomes hot (inflamed) and red.  Document Released: 05/01/2000 Document Revised: 04/23/2011 Document Reviewed: 12/19/2007 [facility] Patient Information 2012 [place], [place].Hard-Soled Shoe Use This is a flat, soft shoe with a hard (sometimes wood) sole. It is used for toe fractures and certain foot surgeries. Your doctor will tell you how much weight to put on your foot. HOME CARE INSTRUCTIONS   Lace the shoe to make it secure and comfortable. You do not want to feel pressure or rubbing on the painful area.   Follow instructions for wear as directed by your caregiver.  Document Released: 02/07/2004 Document Revised: 04/23/2011 Document Reviewed: 05/04/2005 [facility] Patient Information 2012 [place], [place].

## 2011-09-14 ENCOUNTER — Emergency Department (HOSPITAL_COMMUNITY): Payer: Self-pay

## 2011-09-14 ENCOUNTER — Encounter (HOSPITAL_COMMUNITY): Payer: Self-pay | Admitting: Emergency Medicine

## 2011-09-14 ENCOUNTER — Emergency Department (HOSPITAL_COMMUNITY)
Admission: EM | Admit: 2011-09-14 | Discharge: 2011-09-14 | Disposition: A | Discharge status: Home / Self Care | Payer: Self-pay | Attending: Emergency Medicine | Admitting: Emergency Medicine

## 2011-09-14 DIAGNOSIS — Z79899 Other long term (current) drug therapy: Secondary | ICD-10-CM | POA: Insufficient documentation

## 2011-09-14 DIAGNOSIS — Y92009 Unspecified place in unspecified non-institutional (private) residence as the place of occurrence of the external cause: Secondary | ICD-10-CM | POA: Insufficient documentation

## 2011-09-14 DIAGNOSIS — M79609 Pain in unspecified limb: Secondary | ICD-10-CM | POA: Insufficient documentation

## 2011-09-14 DIAGNOSIS — K219 Gastro-esophageal reflux disease without esophagitis: Secondary | ICD-10-CM | POA: Insufficient documentation

## 2011-09-14 DIAGNOSIS — S9032XA Contusion of left foot, initial encounter: Secondary | ICD-10-CM

## 2011-09-14 DIAGNOSIS — W208XXA Other cause of strike by thrown, projected or falling object, initial encounter: Secondary | ICD-10-CM | POA: Insufficient documentation

## 2011-09-14 DIAGNOSIS — I10 Essential (primary) hypertension: Secondary | ICD-10-CM | POA: Insufficient documentation

## 2011-09-14 DIAGNOSIS — S9030XA Contusion of unspecified foot, initial encounter: Secondary | ICD-10-CM | POA: Insufficient documentation

## 2011-09-14 MED ORDER — HYDROCODONE-ACETAMINOPHEN 5-325 MG PO TABS: 1.0000 | ORAL_TABLET | ORAL | Status: AC | PRN

## 2011-09-14 MED ORDER — HYDROCODONE-ACETAMINOPHEN 5-325 MG PO TABS
1.0000 | ORAL_TABLET | Freq: Once | ORAL | Status: AC
  Administered 2011-09-14: 1 via ORAL
  Filled 2011-09-14: qty 1

## 2011-09-14 NOTE — ED Notes (Signed)
Has a broken  Toe and yesterday her child dropped a 2 liter bottle  Of coke on it now it black and blue and hurts

## 2011-09-14 NOTE — ED Provider Notes (Signed)
Medical screening examination/treatment/procedure(s) were performed by non-physician practitioner and as supervising physician I was immediately available for consultation/collaboration.   Mekiyah Gladwell, MD 09/14/11 2006 

## 2011-09-14 NOTE — Discharge Instructions (Signed)
Take vicodin as prescribed for severe pain.   Do not drive within four hours of taking this medication (may cause drowsiness or confusion).  Take ibuprofen, up to 800mg  three times a day as well.  Ice 3 times a day for 15-20 minutes.  Elevate when possible.  Activity as tolerated.  Follow up with the orthopedic physician you were referred to at last visit if you develop numbness in your toe or the pain has not started to improve in 7 days.  You may return to the ER if your pain worsens or you have any other concerns.

## 2011-09-14 NOTE — ED Provider Notes (Signed)
History     CSN: [id number]  Arrival date & time 09/14/11  1221   First MD Initiated Contact with Patient 09/14/11 1431      Chief Complaint  Patient presents with  . Foot Pain    (Consider location/radiation/quality/duration/timing/severity/associated sxs/prior treatment) HPI History provided by pt and prior chart.  Per prior chart, pt diagnosed w/ proximal phalanx fx of left third digit on 08/30/11.  Pt has kept her toes buddy-taped and is using her post-op shoe, but had it off for a brief time today when she came home from the grocery store.  Her son accidentally dropped a 2L bottle of soda on her left foot.  Pain in foot as well as broken toe acutely worsened and is severe.  Associated w/ edema and paresthesias middle toe only.  Aggravated by bearing weight.  Has not taken anything for pain.    Past Medical History  Diagnosis Date  . Hypertension   . Anxiety   . Panic attack   . TMJ disease   . Acid reflux     Past Surgical History  Procedure Date  . Eye surgery   . Dilitation and curettage   . Ankle surgery     Family History  Problem Relation Age of Onset  . Hypertension Other     History  Substance Use Topics  . Smoking status: Current Everyday Smoker  . Smokeless tobacco: Not on file  . Alcohol Use: No    OB History    Grav Para Term Preterm Abortions TAB SAB Ect Mult Living                  Review of Systems  All other systems reviewed and are negative.    Allergies  Other and Paroxetine  Home Medications   Current Outpatient Rx  Name Route Sig Dispense Refill  . ALPRAZOLAM 1 MG PO TABS Oral Take 1 mg by mouth 3 (three) times daily.    . OMEGA-3 FATTY ACIDS 1000 MG PO CAPS Oral Take 1 g by mouth daily.    . IBUPROFEN 200 MG PO TABS Oral Take 800 mg by mouth every 6 (six) hours as needed. For pain    . LISINOPRIL 10 MG PO TABS Oral Take 10 mg by mouth daily.      [name] HYDROCODONE-ACETAMINOPHEN 5-325 MG PO TABS Oral Take 1 tablet by mouth every 4  (four) hours as needed for pain. 20 tablet 0    BP 158/88  Pulse 102  Temp 98.5 F (36.9 C)  Resp 16  SpO2 100%  LMP 09/12/2011  Physical Exam  Nursing note and vitals reviewed. Constitutional: She is oriented to person, place, and time. She appears well-developed and well-nourished. No distress.  HENT:  Head: Normocephalic and atraumatic.  Eyes:       Normal appearance  Neck: Normal range of motion.  Pulmonary/Chest: Effort normal.  Musculoskeletal:       Mild edema left forefoot as well as left middle toe.  Slight ecchymosis 2nd-4th left toes on dorsal surface.  Tenderness 3rd metatarsal, proximal phalanx and PIP joint of middle toe.  Pain w/ passive flexion of toe.  Distal sensation intact but decreased compared to other toes.  2+ radial pulse.   Neurological: She is alert and oriented to person, place, and time.  Psychiatric: She has a normal mood and affect. Her behavior is normal.    ED Course  Procedures (including critical care time)  Labs Reviewed - No data to  display Dg Foot Complete Left  09/14/2011  *RADIOLOGY REPORT*  Clinical Data: Crush injury.  Pain.  LEFT FOOT - COMPLETE 3+ VIEW  Comparison: 08/30/2011  Findings: As before, there is a long oblique fracture involving the proximal phalanx of the middle toe.  Fracture line is less distinct today, consistent with the nonacute nature of this injury.  There is no acute fracture evident on today's study.  Old, healed fracture is identified in the fifth metatarsal.  IMPRESSION: Proximal phalanx fracture of the middle toe again identified.  No new findings on today's exam.  Original Report Authenticated By: ERIC A. MANSELL, M.D.     1. Contusion of left foot       MDM  Pt broke proximal phalanx of left middle toe 2 weeks ago and son dropped a 2L bottle of soda on left foot/toes this afternoon.  Pain acutely worsened and associated w/ paresthesias.  Xray unchanged.  Copies printed for patient.  D/c'd home w/ vicodin  for pain.  Recommended that she continue to wear her post-op shoe, take ibuprofen and ice/elevate.  She has already been referred to ortho.         Otilio Miu, [place] 09/14/11 [phone]

## 2011-10-04 ENCOUNTER — Encounter (HOSPITAL_COMMUNITY): Payer: Self-pay | Admitting: Emergency Medicine

## 2011-10-04 ENCOUNTER — Inpatient Hospital Stay (HOSPITAL_COMMUNITY)
Admission: EM | Admit: 2011-10-04 | Discharge: 2011-10-06 | DRG: 342 | Disposition: A | Discharge status: Home / Self Care | Payer: Medicaid Other | Source: Ambulatory Visit | Attending: Surgery | Admitting: Surgery

## 2011-10-04 DIAGNOSIS — Z79899 Other long term (current) drug therapy: Secondary | ICD-10-CM

## 2011-10-04 DIAGNOSIS — K219 Gastro-esophageal reflux disease without esophagitis: Secondary | ICD-10-CM | POA: Diagnosis present

## 2011-10-04 DIAGNOSIS — F429 Obsessive-compulsive disorder, unspecified: Secondary | ICD-10-CM | POA: Diagnosis present

## 2011-10-04 DIAGNOSIS — K3580 Unspecified acute appendicitis: Principal | ICD-10-CM

## 2011-10-04 DIAGNOSIS — I10 Essential (primary) hypertension: Secondary | ICD-10-CM | POA: Diagnosis present

## 2011-10-04 DIAGNOSIS — Z23 Encounter for immunization: Secondary | ICD-10-CM

## 2011-10-04 DIAGNOSIS — Z888 Allergy status to other drugs, medicaments and biological substances status: Secondary | ICD-10-CM

## 2011-10-04 DIAGNOSIS — F411 Generalized anxiety disorder: Secondary | ICD-10-CM | POA: Diagnosis present

## 2011-10-04 DIAGNOSIS — Z6841 Body Mass Index (BMI) 40.0 and over, adult: Secondary | ICD-10-CM

## 2011-10-04 LAB — URINALYSIS, ROUTINE W REFLEX MICROSCOPIC
Glucose, UA: NEGATIVE mg/dL
Leukocytes, UA: NEGATIVE
Protein, ur: NEGATIVE mg/dL
Specific Gravity, Urine: 1.006 (ref 1.005–1.030)
pH: 6 (ref 5.0–8.0)

## 2011-10-04 LAB — CBC
MCH: 23.5 pg — ABNORMAL LOW (ref 26.0–34.0)
MCHC: 31.1 g/dL (ref 30.0–36.0)
Platelets: 319 10*3/uL (ref 150–400)
RBC: 4.3 MIL/uL (ref 3.87–5.11)

## 2011-10-04 LAB — URINE MICROSCOPIC-ADD ON

## 2011-10-04 LAB — DIFFERENTIAL
Basophils Absolute: 0 10*3/uL (ref 0.0–0.1)
Basophils Relative: 0 % (ref 0–1)
Eosinophils Absolute: 0.3 10*3/uL (ref 0.0–0.7)
Lymphs Abs: 2.4 10*3/uL (ref 0.7–4.0)
Neutrophils Relative %: 66 % (ref 43–77)

## 2011-10-04 LAB — POCT PREGNANCY, URINE: Preg Test, Ur: NEGATIVE

## 2011-10-04 MED ORDER — ONDANSETRON HCL 4 MG/2ML IJ SOLN
4.0000 mg | Freq: Once | INTRAMUSCULAR | Status: AC
  Administered 2011-10-04: 4 mg via INTRAVENOUS
  Filled 2011-10-04: qty 2

## 2011-10-04 MED ORDER — SODIUM CHLORIDE 0.9 % IV BOLUS (SEPSIS)
1000.0000 mL | INTRAVENOUS | Status: AC
  Administered 2011-10-04: 1000 mL via INTRAVENOUS

## 2011-10-04 MED ORDER — HYDROMORPHONE HCL PF 1 MG/ML IJ SOLN
1.0000 mg | Freq: Once | INTRAMUSCULAR | Status: AC
  Administered 2011-10-04: 1 mg via INTRAVENOUS
  Filled 2011-10-04: qty 1

## 2011-10-04 NOTE — ED Notes (Signed)
C/o diarrhea x 2 days.  LMP started on Thursday.  Reports lower abd pain since yesterday with nausea.

## 2011-10-04 NOTE — ED Notes (Addendum)
Pt states he main complaint is her abd pain. Pain is in middle of abd on both sides. Pt states pain increases about 30 min after eating. Pt states she has not been tolerating food. Pt states that she has been abnormally bloated. Pt states she hasnt been able to sleep. Pt also c/o diarrhea x2 days. Pt states she has also seen small amt of bright red blood on toilet paper after wiping.

## 2011-10-04 NOTE — ED Provider Notes (Signed)
History     CSN: [id number]  Arrival date & time 10/04/11  2150   First MD Initiated Contact with Patient 10/04/11 2305      Chief Complaint  Patient presents with  . Abdominal Pain    (Consider location/radiation/quality/duration/timing/severity/associated sxs/prior treatment) HPI Comments: Diarrhea started 2 days ago - associated with abd pain, llocated in the mid abd, radiates to aboth sides, feels bloated and notes can't get jeans buttoned.  2 BM's which were loose with some blood on the tissue but not on the stool.  The stool is greenish brow.  The pain is constant, but seems to fluctuate in intensity - waves lasting 15 minutes.  Has associated nausea but decreased oral intake.  Tried soup and 30 minutes later had increased pain.   No hx of abd surgery.  No dysuria, fevers, cough, sob, swelling of legs.  No abx recently, no sick contacts, no travel.    Patient is a 41 y.o. female presenting with abdominal pain. The history is provided by the patient.  Abdominal Pain The primary symptoms of the illness include abdominal pain.    Past Medical History  Diagnosis Date  . Hypertension   . Anxiety   . Panic attack   . TMJ disease   . Acid reflux     Past Surgical History  Procedure Date  . Eye surgery   . Dilitation and curettage   . Ankle surgery     Family History  Problem Relation Age of Onset  . Hypertension Other     History  Substance Use Topics  . Smoking status: Current Everyday Smoker  . Smokeless tobacco: Not on file  . Alcohol Use: No    OB History    Grav Para Term Preterm Abortions TAB SAB Ect Mult Living                  Review of Systems  Gastrointestinal: Positive for abdominal pain.  All other systems reviewed and are negative.    Allergies  Other and Paroxetine  Home Medications   Current Outpatient Rx  Name Route Sig Dispense Refill  . ALPRAZOLAM 1 MG PO TABS Oral Take 1 mg by mouth 3 (three) times daily.    . OMEGA-3 FATTY ACIDS  1000 MG PO CAPS Oral Take 1 g by mouth daily.    . IBUPROFEN 200 MG PO TABS Oral Take 800 mg by mouth every 6 (six) hours as needed. For pain    . LISINOPRIL 10 MG PO TABS Oral Take 10 mg by mouth daily.        BP 116/72  Pulse 88  Temp(Src) 97 F (36.1 C) (Oral)  Resp 16  SpO2 96%  LMP 10/01/2011  Physical Exam  Nursing note and vitals reviewed. Constitutional: She appears well-developed and well-nourished. No distress.  HENT:  Head: Normocephalic and atraumatic.  Mouth/Throat: No oropharyngeal exudate.       Mucous membranes mildly dehydrated  Eyes: Conjunctivae and EOM are normal. Pupils are equal, round, and reactive to light. Right eye exhibits no discharge. Left eye exhibits no discharge. No scleral icterus.  Neck: Normal range of motion. Neck supple. No JVD present. No thyromegaly present.  Cardiovascular: Normal rate, regular rhythm, normal heart sounds and intact distal pulses.  Exam reveals no gallop and no friction rub.   No murmur heard. Pulmonary/Chest: Effort normal and breath sounds normal. No respiratory distress. She has no wheezes. She has no rales.  Abdominal: Soft. She exhibits no distension  and no mass. There is tenderness ( Periumbilical and right and left and suprapubic lower abdominal tenderness with mild guarding, no masses, no tympanitic sounds to percussion).       Increased bowel sounds  Musculoskeletal: Normal range of motion. She exhibits no edema and no tenderness.  Lymphadenopathy:    She has no cervical adenopathy.  Neurological: She is alert. Coordination normal.  Skin: Skin is warm and dry. No rash noted. No erythema.  Psychiatric: She has a normal mood and affect. Her behavior is normal.    ED Course  Procedures (including critical care time)  Labs Reviewed  URINALYSIS, ROUTINE W REFLEX MICROSCOPIC - Abnormal; Notable for the following:    Hgb urine dipstick TRACE (*)    All other components within normal limits  CBC - Abnormal; Notable  for the following:    Hemoglobin 10.1 (*)    HCT 32.5 (*)    MCV 75.6 (*)    MCH 23.5 (*)    All other components within normal limits  COMPREHENSIVE METABOLIC PANEL - Abnormal; Notable for the following:    Potassium 2.8 (*)    BUN 5 (*)    Albumin 3.3 (*)    All other components within normal limits  URINE MICROSCOPIC-ADD ON - Abnormal; Notable for the following:    Squamous Epithelial / LPF FEW (*)    All other components within normal limits  POCT PREGNANCY, URINE  DIFFERENTIAL  LIPASE, BLOOD   Ct Abdomen Pelvis W Contrast  10/05/2011  *RADIOLOGY REPORT*  Clinical Data: Abdominal pain, distension.  CT ABDOMEN AND PELVIS WITH CONTRAST  Technique:  Multidetector CT imaging of the abdomen and pelvis was performed following the standard protocol during bolus administration of intravenous contrast.  Contrast: OMNIPAQUE IOHEXOL 300 MG/ML  SOLN  Comparison: None.  Findings: Limited images through the lung bases demonstrate no significant appreciable abnormality. The heart size is within normal limits. No pleural or pericardial effusion.  Hepatic steatosis.  No focal abnormality.  Unremarkable biliary system, spleen, pancreas, adrenal glands.  Symmetric renal enhancement.  No hydronephrosis or hydroureter.  Acute appendicitis with the appendix measuring up to 9 mm in diameter.  There is mild periappendiceal fat stranding.  No bowel obstruction.  No CT evidence for colitis.  No free intraperitoneal air or loculated fluid.  Decompressed bladder.  IUD within the uterus.  No adnexal mass.  Normal caliber vasculature.  No lymphadenopathy.  Multilevel degenerative changes of the imaged spine. Mild wedge deformity of L2 and T11.  No definite acute or aggressive appearing osseous lesion.  IMPRESSION: Acute appendicitis.  Original Report Authenticated By: Waneta Martins, M.D.     1. Acute appendicitis       MDM  Focal abdominal tenderness in the lower abdomen with 2 days of diarrhea,  distention to the point that she cannot buckle her pants but normal vital signs. I suspect that she has a colitis or other inflammatory or infectious process of her bowels, symptomatic control, fluids, rule out appendicitis, obstruction with CT scan.   CT scan shows acute appendicitis, white blood cell count of 10,200 he is in the normal range, vital signs are normal, patient has had some improvement with the medications, I have discussed her care with the surgeon on-call Dr. Carolynne Edouard who has agreed to see her in the emergency department.  Vida Roller, MD 10/05/11 [phone]

## 2011-10-05 ENCOUNTER — Encounter (HOSPITAL_COMMUNITY): Admission: EM | Disposition: A | Discharge status: Home / Self Care | Payer: Self-pay | Source: Ambulatory Visit

## 2011-10-05 ENCOUNTER — Observation Stay (HOSPITAL_COMMUNITY): Payer: Medicaid Other | Admitting: Anesthesiology

## 2011-10-05 ENCOUNTER — Encounter (HOSPITAL_COMMUNITY): Payer: Self-pay | Admitting: Anesthesiology

## 2011-10-05 ENCOUNTER — Emergency Department (HOSPITAL_COMMUNITY): Payer: Medicaid Other

## 2011-10-05 ENCOUNTER — Encounter (HOSPITAL_COMMUNITY): Payer: Self-pay | Admitting: Radiology

## 2011-10-05 DIAGNOSIS — K3580 Unspecified acute appendicitis: Secondary | ICD-10-CM

## 2011-10-05 HISTORY — PX: LAPAROSCOPIC APPENDECTOMY: SHX408

## 2011-10-05 LAB — COMPREHENSIVE METABOLIC PANEL
ALT: 15 U/L (ref 0–35)
AST: 15 U/L (ref 0–37)
Albumin: 3.3 g/dL — ABNORMAL LOW (ref 3.5–5.2)
Alkaline Phosphatase: 101 U/L (ref 39–117)
Potassium: 2.8 mEq/L — ABNORMAL LOW (ref 3.5–5.1)
Sodium: 136 mEq/L (ref 135–145)
Total Protein: 6.8 g/dL (ref 6.0–8.3)

## 2011-10-05 LAB — CBC
HCT: 31.9 % — ABNORMAL LOW (ref 36.0–46.0)
Hemoglobin: 9.6 g/dL — ABNORMAL LOW (ref 12.0–15.0)
MCHC: 30.1 g/dL (ref 30.0–36.0)
MCV: 77.2 fL — ABNORMAL LOW (ref 78.0–100.0)

## 2011-10-05 LAB — MRSA PCR SCREENING: MRSA by PCR: NEGATIVE

## 2011-10-05 LAB — CREATININE, SERUM: GFR calc non Af Amer: >90 mL/min (ref >90)

## 2011-10-05 SURGERY — APPENDECTOMY, LAPAROSCOPIC
Anesthesia: General | Site: Abdomen | Wound class: Contaminated

## 2011-10-05 MED ORDER — HYDROMORPHONE HCL PF 1 MG/ML IJ SOLN
0.5000 mg | INTRAMUSCULAR | Status: DC | PRN
  Administered 2011-10-05 – 2011-10-06 (×7): 0.5 mg via INTRAVENOUS
  Filled 2011-10-05 (×7): qty 1

## 2011-10-05 MED ORDER — IOHEXOL 300 MG/ML  SOLN: 40.0000 mL | INTRAMUSCULAR | Status: DC

## 2011-10-05 MED ORDER — 0.9 % SODIUM CHLORIDE (POUR BTL) OPTIME
TOPICAL | Status: DC | PRN
  Administered 2011-10-05: 1000 mL

## 2011-10-05 MED ORDER — PANTOPRAZOLE SODIUM 40 MG IV SOLR
40.0000 mg | Freq: Every day | INTRAVENOUS | Status: DC
  Filled 2011-10-05: qty 40

## 2011-10-05 MED ORDER — NEOSTIGMINE METHYLSULFATE 1 MG/ML IJ SOLN
INTRAMUSCULAR | Status: DC | PRN
  Administered 2011-10-05: 5 mg via INTRAVENOUS

## 2011-10-05 MED ORDER — SODIUM CHLORIDE 0.9 % IR SOLN
Status: DC | PRN
  Administered 2011-10-05: 1000 mL

## 2011-10-05 MED ORDER — LISINOPRIL 10 MG PO TABS
10.0000 mg | ORAL_TABLET | Freq: Every day | ORAL | Status: DC
  Administered 2011-10-06: 10 mg via ORAL
  Filled 2011-10-05: qty 1

## 2011-10-05 MED ORDER — MAGNESIUM SULFATE 40 MG/ML IJ SOLN
2.0000 g | INTRAMUSCULAR | Status: AC
  Administered 2011-10-05: 2 g via INTRAVENOUS
  Filled 2011-10-05: qty 50

## 2011-10-05 MED ORDER — ONDANSETRON HCL 4 MG/2ML IJ SOLN: 4.0000 mg | Freq: Four times a day (QID) | INTRAMUSCULAR | Status: DC | PRN

## 2011-10-05 MED ORDER — SUCCINYLCHOLINE CHLORIDE 20 MG/ML IJ SOLN
INTRAMUSCULAR | Status: DC | PRN
  Administered 2011-10-05: 100 mg via INTRAVENOUS

## 2011-10-05 MED ORDER — LIDOCAINE HCL 1 % IJ SOLN
INTRAMUSCULAR | Status: DC | PRN
  Administered 2011-10-05: 100 mg via INTRADERMAL

## 2011-10-05 MED ORDER — ROCURONIUM BROMIDE 100 MG/10ML IV SOLN
INTRAVENOUS | Status: DC | PRN
  Administered 2011-10-05: 10 mg via INTRAVENOUS
  Administered 2011-10-05: 30 mg via INTRAVENOUS
  Administered 2011-10-05 (×2): 10 mg via INTRAVENOUS

## 2011-10-05 MED ORDER — PNEUMOCOCCAL VAC POLYVALENT 25 MCG/0.5ML IJ INJ
0.5000 mL | INJECTION | INTRAMUSCULAR | Status: DC
Start: 2011-10-06 — End: 2011-10-05

## 2011-10-05 MED ORDER — MIDAZOLAM HCL 5 MG/5ML IJ SOLN
INTRAMUSCULAR | Status: DC | PRN
  Administered 2011-10-05: 2 mg via INTRAVENOUS

## 2011-10-05 MED ORDER — PANTOPRAZOLE SODIUM 40 MG IV SOLR
40.0000 mg | Freq: Every day | INTRAVENOUS | Status: DC
  Administered 2011-10-05: 40 mg via INTRAVENOUS
  Filled 2011-10-05 (×2): qty 40

## 2011-10-05 MED ORDER — PNEUMOCOCCAL VAC POLYVALENT 25 MCG/0.5ML IJ INJ
0.5000 mL | INJECTION | INTRAMUSCULAR | Status: AC
  Administered 2011-10-06: 0.5 mL via INTRAMUSCULAR
  Filled 2011-10-05: qty 0.5

## 2011-10-05 MED ORDER — KCL IN DEXTROSE-NACL 20-5-0.45 MEQ/L-%-% IV SOLN
INTRAVENOUS | Status: DC
  Administered 2011-10-05 – 2011-10-06 (×2): via INTRAVENOUS
  Filled 2011-10-05 (×6): qty 1000

## 2011-10-05 MED ORDER — SODIUM CHLORIDE 0.9 % IV SOLN
1.0000 g | INTRAVENOUS | Status: DC
  Administered 2011-10-05 – 2011-10-06 (×2): 1 g via INTRAVENOUS
  Filled 2011-10-05 (×3): qty 1

## 2011-10-05 MED ORDER — BUPIVACAINE HCL (PF) 0.25 % IJ SOLN
INTRAMUSCULAR | Status: DC | PRN
  Administered 2011-10-05: 1 mL

## 2011-10-05 MED ORDER — ONDANSETRON HCL 4 MG/2ML IJ SOLN
INTRAMUSCULAR | Status: DC | PRN
  Administered 2011-10-05: 4 mg via INTRAVENOUS

## 2011-10-05 MED ORDER — FENTANYL CITRATE 0.05 MG/ML IJ SOLN
INTRAMUSCULAR | Status: DC | PRN
  Administered 2011-10-05 (×2): 50 ug via INTRAVENOUS
  Administered 2011-10-05: 150 ug via INTRAVENOUS
  Administered 2011-10-05: 50 ug via INTRAVENOUS

## 2011-10-05 MED ORDER — IOHEXOL 300 MG/ML  SOLN
100.0000 mL | Freq: Once | INTRAMUSCULAR | Status: AC | PRN
  Administered 2011-10-05: 100 mL via INTRAVENOUS

## 2011-10-05 MED ORDER — HEPARIN SODIUM (PORCINE) 5000 UNIT/ML IJ SOLN
5000.0000 [IU] | Freq: Three times a day (TID) | INTRAMUSCULAR | Status: DC
  Administered 2011-10-06: 5000 [IU] via SUBCUTANEOUS
  Filled 2011-10-05 (×4): qty 1

## 2011-10-05 MED ORDER — LACTATED RINGERS IV SOLN
INTRAVENOUS | Status: DC | PRN
  Administered 2011-10-05 (×2): via INTRAVENOUS

## 2011-10-05 MED ORDER — FENTANYL CITRATE 0.05 MG/ML IJ SOLN
100.0000 ug | Freq: Once | INTRAMUSCULAR | Status: AC
  Administered 2011-10-05: 100 ug via INTRAVENOUS

## 2011-10-05 MED ORDER — SODIUM CHLORIDE 0.9 % IV SOLN
3.0000 g | Freq: Once | INTRAVENOUS | Status: AC
  Administered 2011-10-05: 3 g via INTRAVENOUS
  Filled 2011-10-05: qty 3

## 2011-10-05 MED ORDER — GLYCOPYRROLATE 0.2 MG/ML IJ SOLN
INTRAMUSCULAR | Status: DC | PRN
  Administered 2011-10-05: .8 mg via INTRAVENOUS

## 2011-10-05 MED ORDER — POTASSIUM CHLORIDE 10 MEQ/100ML IV SOLN
10.0000 meq | Freq: Once | INTRAVENOUS | Status: AC
  Administered 2011-10-05: 10 meq via INTRAVENOUS
  Filled 2011-10-05: qty 100

## 2011-10-05 MED ORDER — KCL IN DEXTROSE-NACL 20-5-0.9 MEQ/L-%-% IV SOLN
INTRAVENOUS | Status: DC
  Filled 2011-10-05 (×3): qty 1000

## 2011-10-05 MED ORDER — LISINOPRIL 10 MG PO TABS
10.0000 mg | ORAL_TABLET | Freq: Every day | ORAL | Status: DC
  Filled 2011-10-05: qty 1

## 2011-10-05 MED ORDER — HYDROMORPHONE HCL PF 2 MG/ML IJ SOLN
2.0000 mg | Freq: Once | INTRAMUSCULAR | Status: AC
  Administered 2011-10-05: 2 mg via INTRAVENOUS
  Filled 2011-10-05: qty 1

## 2011-10-05 MED ORDER — MORPHINE SULFATE 4 MG/ML IJ SOLN
4.0000 mg | INTRAMUSCULAR | Status: DC | PRN
  Administered 2011-10-05 (×2): 4 mg via INTRAVENOUS
  Filled 2011-10-05 (×2): qty 1

## 2011-10-05 MED ORDER — FENTANYL CITRATE 0.05 MG/ML IJ SOLN
INTRAMUSCULAR | Status: AC
  Filled 2011-10-05: qty 2

## 2011-10-05 MED ORDER — LACTATED RINGERS IV SOLN
INTRAVENOUS | Status: DC
  Administered 2011-10-05: 11:00:00 via INTRAVENOUS

## 2011-10-05 MED ORDER — HYDROMORPHONE HCL PF 1 MG/ML IJ SOLN
0.2500 mg | INTRAMUSCULAR | Status: DC | PRN
  Administered 2011-10-05 (×4): 0.5 mg via INTRAVENOUS
  Filled 2011-10-05: qty 1

## 2011-10-05 MED ORDER — HYDROMORPHONE HCL PF 1 MG/ML IJ SOLN
INTRAMUSCULAR | Status: AC
  Filled 2011-10-05: qty 1

## 2011-10-05 MED ORDER — ALPRAZOLAM 0.5 MG PO TABS
1.0000 mg | ORAL_TABLET | Freq: Three times a day (TID) | ORAL | Status: DC
  Administered 2011-10-05 – 2011-10-06 (×3): 1 mg via ORAL
  Filled 2011-10-05 (×2): qty 1
  Filled 2011-10-05 (×2): qty 2

## 2011-10-05 MED ORDER — ONDANSETRON HCL 4 MG/2ML IJ SOLN: 4.0000 mg | Freq: Once | INTRAMUSCULAR | Status: AC | PRN

## 2011-10-05 MED ORDER — PROPOFOL 10 MG/ML IV EMUL
INTRAVENOUS | Status: DC | PRN
  Administered 2011-10-05: 200 mg via INTRAVENOUS

## 2011-10-05 SURGICAL SUPPLY — 43 items
APPLIER CLIP ROT 10 11.4 M/L (STAPLE)
BENZOIN TINCTURE PRP APPL 2/3 (GAUZE/BANDAGES/DRESSINGS) ×2 IMPLANT
BLADE SURG ROTATE 9660 (MISCELLANEOUS) IMPLANT
CANISTER SUCTION 2500CC (MISCELLANEOUS) ×2 IMPLANT
CLIP APPLIE ROT 10 11.4 M/L (STAPLE) IMPLANT
CLOTH BEACON ORANGE TIMEOUT ST (SAFETY) ×2 IMPLANT
COVER SURGICAL LIGHT HANDLE (MISCELLANEOUS) ×2 IMPLANT
CUTTER FLEX LINEAR 45M (STAPLE) ×2 IMPLANT
DERMABOND ADHESIVE PROPEN (GAUZE/BANDAGES/DRESSINGS) ×1
DERMABOND ADVANCED .7 DNX6 (GAUZE/BANDAGES/DRESSINGS) ×1 IMPLANT
ELECT REM PT RETURN 9FT ADLT (ELECTROSURGICAL) ×2
ELECTRODE REM PT RTRN 9FT ADLT (ELECTROSURGICAL) ×1 IMPLANT
ENDOLOOP SUT PDS II  0 18 (SUTURE)
ENDOLOOP SUT PDS II 0 18 (SUTURE) IMPLANT
GLOVE BIO SURGEON STRL SZ7 (GLOVE) ×2 IMPLANT
GLOVE BIO SURGEON STRL SZ8 (GLOVE) ×2 IMPLANT
GLOVE BIOGEL PI IND STRL 7.0 (GLOVE) ×3 IMPLANT
GLOVE BIOGEL PI INDICATOR 7.0 (GLOVE) ×3
GLOVE SS BIOGEL STRL SZ 7 (GLOVE) ×1 IMPLANT
GLOVE SUPERSENSE BIOGEL SZ 7 (GLOVE) ×1
GLOVE SURG SS PI 6.5 STRL IVOR (GLOVE) ×2 IMPLANT
GLOVE SURG SS PI 7.0 STRL IVOR (GLOVE) ×2 IMPLANT
GOWN PREVENTION PLUS XXLARGE (GOWN DISPOSABLE) ×2 IMPLANT
GOWN STRL NON-REIN LRG LVL3 (GOWN DISPOSABLE) ×8 IMPLANT
KIT BASIN OR (CUSTOM PROCEDURE TRAY) ×2 IMPLANT
KIT ROOM TURNOVER OR (KITS) ×2 IMPLANT
NS IRRIG 1000ML POUR BTL (IV SOLUTION) ×2 IMPLANT
PAD ARMBOARD 7.5X6 YLW CONV (MISCELLANEOUS) ×4 IMPLANT
POUCH SPECIMEN RETRIEVAL 10MM (ENDOMECHANICALS) ×2 IMPLANT
RELOAD 45 VASCULAR/THIN (ENDOMECHANICALS) ×2 IMPLANT
SCALPEL HARMONIC ACE (MISCELLANEOUS) ×2 IMPLANT
SET IRRIG TUBING LAPAROSCOPIC (IRRIGATION / IRRIGATOR) ×2 IMPLANT
SPECIMEN JAR SMALL (MISCELLANEOUS) ×2 IMPLANT
SUT VICRYL 0 UR6 27IN ABS (SUTURE) ×6 IMPLANT
SUT VICRYL 4-0 PS2 18IN ABS (SUTURE) ×4 IMPLANT
TOWEL OR 17X24 6PK STRL BLUE (TOWEL DISPOSABLE) ×2 IMPLANT
TOWEL OR 17X26 10 PK STRL BLUE (TOWEL DISPOSABLE) ×2 IMPLANT
TRAY FOLEY CATH 14FR (SET/KITS/TRAYS/PACK) ×2 IMPLANT
TRAY LAPAROSCOPIC (CUSTOM PROCEDURE TRAY) ×2 IMPLANT
TROCAR XCEL BLUNT TIP 100MML (ENDOMECHANICALS) ×2 IMPLANT
TROCAR XCEL NON-BLD 11X100MML (ENDOMECHANICALS) ×2 IMPLANT
TROCAR XCEL NON-BLD 5MMX100MML (ENDOMECHANICALS) ×2 IMPLANT
WATER STERILE IRR 1000ML POUR (IV SOLUTION) IMPLANT

## 2011-10-05 NOTE — Anesthesia Postprocedure Evaluation (Signed)
  Anesthesia Post-op Note  Patient: [name]  Procedure(s) Performed: Procedure(s) (LRB): APPENDECTOMY LAPAROSCOPIC (N/A)  Patient Location: PACU  Anesthesia Type: General  Level of Consciousness: awake, alert  and oriented  Airway and Oxygen Therapy: Patient Spontanous Breathing and Patient connected to nasal cannula oxygen  Post-op Pain: mild  Post-op Assessment: Post-op Vital signs reviewed  Post-op Vital Signs: Reviewed  Complications: No apparent anesthesia complications

## 2011-10-05 NOTE — H&P (Addendum)
[name] is an 41 y.o. female.   Chief Complaint: abdominal pain HPI: the patient is a 41 year old white female who began having right lower quadrant abdominal pain on Thursday. The pain has gotten worse since then. She has felt feverish and chilled. She has had some nausea but no vomiting. She came to the her emergency department tonight where a CT scan did show that she had appendicitis but no evidence of abscess.  Past Medical History  Diagnosis Date  . Hypertension   . Anxiety   . Panic attack   . TMJ disease   . Acid reflux     Past Surgical History  Procedure Date  . Eye surgery   . Dilitation and curettage   . Ankle surgery     Family History  Problem Relation Age of Onset  . Hypertension Other    Social History:  reports that she has been smoking.  She does not have any smokeless tobacco history on file. She reports that she does not drink alcohol or use illicit drugs.  Allergies:  Allergies  Allergen Reactions  . Other     All antidepressents.   REACTION: Delerium, panic attacks, etc.  . Paroxetine     REACTION: delerium and panic reaction     (Not in a hospital admission)  Results for orders placed during the hospital encounter of 10/04/11 (from the past 48 hour(s))  POCT PREGNANCY, URINE     Status: Normal   Collection Time   10/04/11 10:05 PM      Component Value Range Comment   Preg Test, Ur NEGATIVE  NEGATIVE    URINALYSIS, ROUTINE W REFLEX MICROSCOPIC     Status: Abnormal   Collection Time   10/04/11 10:48 PM      Component Value Range Comment   Color, Urine YELLOW  YELLOW     APPearance CLEAR  CLEAR     Specific Gravity, Urine 1.006  1.005 - 1.030     pH 6.0  5.0 - 8.0     Glucose, UA NEGATIVE  NEGATIVE (mg/dL)    Hgb urine dipstick TRACE (*) NEGATIVE     Bilirubin Urine NEGATIVE  NEGATIVE     Ketones, ur NEGATIVE  NEGATIVE (mg/dL)    Protein, ur NEGATIVE  NEGATIVE (mg/dL)    Urobilinogen, UA 0.2  0.0 - 1.0 (mg/dL)    Nitrite NEGATIVE   NEGATIVE     Leukocytes, UA NEGATIVE  NEGATIVE    URINE MICROSCOPIC-ADD ON     Status: Abnormal   Collection Time   10/04/11 10:48 PM      Component Value Range Comment   Squamous Epithelial / LPF FEW (*) RARE     RBC / HPF 0-2  <3 (RBC/hpf)    Bacteria, UA RARE  RARE    CBC     Status: Abnormal   Collection Time   10/04/11 11:18 PM      Component Value Range Comment   WBC 10.2  4.0 - 10.5 (K/uL)    RBC 4.30  3.87 - 5.11 (MIL/uL)    Hemoglobin 10.1 (*) 12.0 - 15.0 (g/dL)    HCT 16.1 (*) 09.6 - 46.0 (%)    MCV 75.6 (*) 78.0 - 100.0 (fL)    MCH 23.5 (*) 26.0 - 34.0 (pg)    MCHC 31.1  30.0 - 36.0 (g/dL)    RDW 04.5  40.9 - 81.1 (%)    Platelets 319  150 - 400 (K/uL)   DIFFERENTIAL  Status: Normal   Collection Time   10/04/11 11:18 PM      Component Value Range Comment   Neutrophils Relative 66  43 - 77 (%)    Neutro Abs 6.7  1.7 - 7.7 (K/uL)    Lymphocytes Relative 23  12 - 46 (%)    Lymphs Abs 2.4  0.7 - 4.0 (K/uL)    Monocytes Relative 8  3 - 12 (%)    Monocytes Absolute 0.8  0.1 - 1.0 (K/uL)    Eosinophils Relative 3  0 - 5 (%)    Eosinophils Absolute 0.3  0.0 - 0.7 (K/uL)    Basophils Relative 0  0 - 1 (%)    Basophils Absolute 0.0  0.0 - 0.1 (K/uL)   COMPREHENSIVE METABOLIC PANEL     Status: Abnormal   Collection Time   10/04/11 11:18 PM      Component Value Range Comment   Sodium 136  135 - 145 (mEq/L)    Potassium 2.8 (*) 3.5 - 5.1 (mEq/L)    Chloride 100  96 - 112 (mEq/L)    CO2 25  19 - 32 (mEq/L)    Glucose, Bld 87  70 - 99 (mg/dL)    BUN 5 (*) 6 - 23 (mg/dL)    Creatinine, Ser 9.60  0.50 - 1.10 (mg/dL)    Calcium 8.9  8.4 - 10.5 (mg/dL)    Total Protein 6.8  6.0 - 8.3 (g/dL)    Albumin 3.3 (*) 3.5 - 5.2 (g/dL)    AST 15  0 - 37 (U/L)    ALT 15  0 - 35 (U/L)    Alkaline Phosphatase 101  39 - 117 (U/L)    Total Bilirubin 0.3  0.3 - 1.2 (mg/dL)    GFR calc non Af Amer >90  >90 (mL/min)    GFR calc Af Amer >90  >90 (mL/min)   LIPASE, BLOOD     Status: Normal    Collection Time   10/04/11 11:18 PM      Component Value Range Comment   Lipase 22  11 - 59 (U/L)    Ct Abdomen Pelvis W Contrast  10/05/2011  *RADIOLOGY REPORT*  Clinical Data: Abdominal pain, distension.  CT ABDOMEN AND PELVIS WITH CONTRAST  Technique:  Multidetector CT imaging of the abdomen and pelvis was performed following the standard protocol during bolus administration of intravenous contrast.  Contrast: OMNIPAQUE IOHEXOL 300 MG/ML  SOLN  Comparison: None.  Findings: Limited images through the lung bases demonstrate no significant appreciable abnormality. The heart size is within normal limits. No pleural or pericardial effusion.  Hepatic steatosis.  No focal abnormality.  Unremarkable biliary system, spleen, pancreas, adrenal glands.  Symmetric renal enhancement.  No hydronephrosis or hydroureter.  Acute appendicitis with the appendix measuring up to 9 mm in diameter.  There is mild periappendiceal fat stranding.  No bowel obstruction.  No CT evidence for colitis.  No free intraperitoneal air or loculated fluid.  Decompressed bladder.  IUD within the uterus.  No adnexal mass.  Normal caliber vasculature.  No lymphadenopathy.  Multilevel degenerative changes of the imaged spine. Mild wedge deformity of L2 and T11.  No definite acute or aggressive appearing osseous lesion.  IMPRESSION: Acute appendicitis.  Original Report Authenticated By: Waneta Martins, M.D.    Review of Systems  Constitutional: Positive for fever.  HENT: Negative.   Eyes: Negative.   Respiratory: Negative.   Cardiovascular: Negative.   Gastrointestinal: Positive for abdominal pain.  Genitourinary: Negative.   Musculoskeletal: Negative.   Skin: Negative.   Neurological: Negative.   Endo/Heme/Allergies: Negative.   Psychiatric/Behavioral: Negative.     Blood pressure 117/72, pulse 94, temperature 98.1 F (36.7 C), temperature source Oral, resp. rate 16, last menstrual period 10/01/2011, SpO2  96.00%. Physical Exam  Constitutional: She is oriented to person, place, and time. She appears well-developed and well-nourished.  HENT:  Head: Normocephalic and atraumatic.  Eyes: Conjunctivae and EOM are normal. Pupils are equal, round, and reactive to light.  Neck: Normal range of motion. Neck supple.  Cardiovascular: Normal rate, regular rhythm and normal heart sounds.   Respiratory: Effort normal and breath sounds normal.  GI: Soft.       Focally tender in the right lower quadrant  Musculoskeletal: Normal range of motion.  Neurological: She is alert and oriented to person, place, and time.  Skin: Skin is warm and dry.  Psychiatric: She has a normal mood and affect. Her behavior is normal.     Assessment/Plan The patient has what appears to be acute appendicitis. Because of the risk of perforation and sepsis I think she would benefit from having her appendix removed.  I have discussed with her in detail the risks and benefits of the operation to remove the appendix as well as some of the technical aspects and she understands and wishes to proceed.  TOTH III,PAUL S 10/05/2011, 2:57 AM There has been no change in the patient's past medical history or physical exam in the past 24 hours to the best of my knowledge. I examined the patient in the holding area and have made any changes to the history and physical exam report that is included above.   Expectations and outcome results have been discussed with the patient to include risks and benefits.  All questions have been answered and we will proceed with previously discussed procedure noted and signed in the consent form in the patient's record.  I discussed the procedure again with the patient in the holding area. She denies me of her allergy to morphine and requested Dilaudid postoperatively.  Nil Bolser BMD FACS 11:53 AM  10/05/2011

## 2011-10-05 NOTE — ED Notes (Signed)
Patient transported to CT 

## 2011-10-05 NOTE — Transfer of Care (Signed)
Immediate Anesthesia Transfer of Care Note  Patient: [name]  Procedure(s) Performed: Procedure(s) (LRB): APPENDECTOMY LAPAROSCOPIC (N/A)  Patient Location: PACU  Anesthesia Type: General  Level of Consciousness: awake, alert , oriented and patient cooperative  Airway & Oxygen Therapy: Patient Spontanous Breathing and Patient connected to nasal cannula oxygen  Post-op Assessment: Report given to PACU RN, Post -op Vital signs reviewed and stable and Patient moving all extremities X 4  Post vital signs: Reviewed and stable  Complications: No apparent anesthesia complications

## 2011-10-05 NOTE — Anesthesia Procedure Notes (Signed)
Procedure Name: Intubation Date/Time: 10/05/2011 12:04 PM Performed by: Leona Singleton A Pre-anesthesia Checklist: Patient identified Patient Re-evaluated:Patient Re-evaluated prior to inductionOxygen Delivery Method: Circle system utilized Preoxygenation: Pre-oxygenation with 100% oxygen Intubation Type: IV induction, Rapid sequence and Cricoid Pressure applied Laryngoscope Size: Miller and 2 Grade View: Grade I Tube type: Oral Airway Equipment and Method: Stylet Placement Confirmation: ETT inserted through vocal cords under direct vision,  positive ETCO2 and breath sounds checked- equal and bilateral Secured at: 22 cm Tube secured with: Tape Dental Injury: Teeth and Oropharynx as per pre-operative assessment

## 2011-10-05 NOTE — ED Notes (Signed)
General surgery at bedside. 

## 2011-10-05 NOTE — Anesthesia Preprocedure Evaluation (Addendum)
Anesthesia Evaluation  Patient identified by MRN, date of birth, ID band Patient awake    Reviewed: Allergy & Precautions, H&P , NPO status , Patient's Chart, lab work & pertinent test results  Airway Mallampati: I TM Distance: >3 FB     Dental  (+) Teeth Intact and Dental Advisory Given   Pulmonary Current Smoker,  breath sounds clear to auscultation        Cardiovascular hypertension, Pt. on medications + Valvular Problems/Murmurs (reported to have systolic murmur during pregnancy) Rhythm:Regular Rate:Normal     Neuro/Psych PSYCHIATRIC DISORDERS Anxiety OCD Panic attacks   GI/Hepatic Neg liver ROS, GERD-  Medicated and Controlled,Patient received Oral Contrast Agents,Acute appendicitis    Endo/Other  Morbid obesity  Renal/GU negative Renal ROS     Musculoskeletal negative musculoskeletal ROS (+)   Abdominal (+) + obese,   Peds  Hematology negative hematology ROS (+)   Anesthesia Other Findings   Reproductive/Obstetrics                        Anesthesia Physical Anesthesia Plan  ASA: II  Anesthesia Plan: General   Post-op Pain Management:    Induction: Intravenous  Airway Management Planned: Oral ETT  Additional Equipment:   Intra-op Plan:   Post-operative Plan: Extubation in OR  Informed Consent: I have reviewed the patients History and Physical, chart, labs and discussed the procedure including the risks, benefits and alternatives for the proposed anesthesia with the patient or authorized representative who has indicated his/her understanding and acceptance.   Dental advisory given  Plan Discussed with: CRNA, Anesthesiologist and Surgeon  Anesthesia Plan Comments:         Anesthesia Quick Evaluation

## 2011-10-05 NOTE — Preoperative (Signed)
Beta Blockers   Reason not to administer Beta Blockers:Not Applicable 

## 2011-10-05 NOTE — Op Note (Signed)
Surgeon: Wenda Low, MD, FACS  Asst:  None  Anes:  General  Procedure: Laparoscopic appendectomy  Diagnosis: Acute retrocecal appendicitis  Complications: None  EBL:   4  cc  Description of Procedure:  Patient was taken to room 17 on Monday, 10/05/2011 and given general anesthesia. Abdomen was prepped with PCMX and draped sterilely. A timeout was performed. A longitudinal incision was made down into the umbilicus and I then elevated this and a long linear incision through which I inserted the [place] trocar without difficulty. It was held in place with a fascial holding suture and enable me to insufflate the abdomen. A 5 mm was placed in the right upper quadrant and an 1112 place a left lower quadrant obliquely. The appendix tip was visualize but it was retrocecal and stuck the terminal ileum. I carefully dissected the terminal ileum off the area and mobilize the appendix from the retroperitoneal location. I then was able to go through the mesentery of the appendix with the harmonic scalpel finally isolating it at the base. I applied a single load Ethicon GIA using a white load in amputated the appendix from the cecum with a single application. Appendix was removed placed in a bag and brought to the umbilicus. I went back and inspected the appendiceal stump and hemostasis was present. Port sites were all injected with Marcaine. The umbilical defect was repaired with 3 sutures of 0 Vicryl. The incisions were closed with 4-0 Vicryl subcuticularly and with Dermabond. Patient are the procedure well taken recovery in satisfactory condition.   Matt B. Daphine Deutscher, MD, [facility], [place] [phone]

## 2011-10-06 ENCOUNTER — Encounter (HOSPITAL_COMMUNITY): Payer: Self-pay | Admitting: Surgery

## 2011-10-06 LAB — CBC
Hemoglobin: 8.6 g/dL — ABNORMAL LOW (ref 12.0–15.0)
MCH: 23.8 pg — ABNORMAL LOW (ref 26.0–34.0)
RBC: 3.62 MIL/uL — ABNORMAL LOW (ref 3.87–5.11)

## 2011-10-06 MED ORDER — OXYCODONE-ACETAMINOPHEN 5-325 MG PO TABS
1.0000 | ORAL_TABLET | ORAL | Status: DC | PRN
  Administered 2011-10-06: 1 via ORAL
  Administered 2011-10-06: 2 via ORAL
  Filled 2011-10-06 (×2): qty 2

## 2011-10-06 MED ORDER — ALUM & MAG HYDROXIDE-SIMETH 200-200-20 MG/5ML PO SUSP
30.0000 mL | ORAL | Status: DC | PRN
  Administered 2011-10-06: 30 mL via ORAL
  Filled 2011-10-06: qty 30

## 2011-10-06 MED ORDER — OXYCODONE-ACETAMINOPHEN 5-325 MG PO TABS: 1.0000 | ORAL_TABLET | ORAL | Status: DC | PRN

## 2011-10-06 NOTE — Discharge Summary (Signed)
Patient ID: [name] MRN: [id number] DOB/AGE: 1970-06-13 40 y.o.  Admit date: 10/04/2011 Discharge date: 10/06/2011  Procedures: lap appy  Consults: None  Reason for Admission: this is a 41 yo female who presented to the [facility] with RLQ abdominal pain.  She was worked up and found to have acute appendicitis.  Admission Diagnoses:  1. Acute appendicitis Patient Active Problem List  Diagnoses  . MORBID OBESITY  . ANEMIA, IRON DEFICIENCY, UNSPEC.  [name] ANXIETY  . PANIC ATTACKS  . OBSESSIVE COMPUL. DISORDER  . HYPERTENSION, BENIGN SYSTEMIC  . GASTROESOPHAGEAL REFLUX, NO ESOPHAGITIS  . SYSTOLIC MURMUR  . Appendicitis, acute    Hospital Course: The patient was admitted and taken to the operating room where she underwent a laparoscopic appendectomy.  She tolerated the procedure well.  Her diet was advanced and she was started on oral pain medications on POD# 1.  She was otherwise felt stable for discharge home.  PE: Abd: soft, appropriately tender, +BS, ND, incisions c/d/i with dermabond  Discharge Diagnoses:  Active Problems:  Appendicitis, acute s/p lap appy  Discharge Medications: Medication List  As of 10/06/2011  9:26 AM   TAKE these medications         ALPRAZolam 1 MG tablet   Commonly known as: XANAX   Take 1 mg by mouth 3 (three) times daily.      fish oil-omega-3 fatty acids 1000 MG capsule   Take 1 g by mouth daily.      ibuprofen 200 MG tablet   Commonly known as: ADVIL,MOTRIN   Take 800 mg by mouth every 6 (six) hours as needed. For pain      lisinopril 10 MG tablet   Commonly known as: PRINIVIL,ZESTRIL   Take 10 mg by mouth daily.      oxyCODONE-acetaminophen 5-325 MG per tablet   Commonly known as: PERCOCET   Take 1-2 tablets by mouth every 4 (four) hours as needed.            Discharge Instructions: Follow-up Information    Follow up with Luretha Murphy B, MD. Schedule an appointment as soon as possible for a visit in 3 weeks.   Contact  information:   [employer] [address] [place] [address] [phone]          Signed: Letha Cape 10/06/2011, 9:26 AM

## 2011-10-06 NOTE — Discharge Instructions (Signed)
[facility] ______[facility] °LAPAROSCOPIC SURGERY: POST OP INSTRUCTIONS °Always review your discharge instruction sheet given to you by the facility where your surgery was performed. °IF YOU HAVE DISABILITY OR FAMILY LEAVE FORMS, YOU MUST BRING THEM TO THE OFFICE FOR PROCESSING.   °DO NOT GIVE THEM TO YOUR DOCTOR. ° °1. A prescription for pain medication may be given to you upon discharge.  Take your pain medication as prescribed, if needed.  If narcotic pain medicine is not needed, then you may take acetaminophen (Tylenol) or ibuprofen (Advil) as needed. °2. Take your usually prescribed medications unless otherwise directed. °3. If you need a refill on your pain medication, please contact your pharmacy.  They will contact our office to request authorization. Prescriptions will not be filled after 5pm or on week-ends. °4. You should follow a light diet the first few days after arrival home, such as soup and crackers, etc.  Be sure to include lots of fluids daily. °5. Most patients will experience some swelling and bruising in the area of the incisions.  Ice packs will help.  Swelling and bruising can take several days to resolve.  °6. It is common to experience some constipation if taking pain medication after surgery.  Increasing fluid intake and taking a stool softener (such as Colace) will usually help or prevent this problem from occurring.  A mild laxative (Milk of Magnesia or Miralax) should be taken according to package instructions if there are no bowel movements after 48 hours. °7. Unless discharge instructions indicate otherwise, you may remove your bandages 24-48 hours after surgery, and you may shower at that time.  You may have steri-strips (small skin tapes) in place directly over the incision.  These strips should be left on the skin for 7-10 days.  If your surgeon used skin glue on the incision, you may shower in 24 hours.  The glue will flake off over the next 2-3 weeks.  Any sutures or  staples will be removed at the office during your follow-up visit. °8. ACTIVITIES:  You may resume regular (light) daily activities beginning the next day--such as daily self-care, walking, climbing stairs--gradually increasing activities as tolerated.  You may have sexual intercourse when it is comfortable.  Refrain from any heavy lifting or straining until approved by your doctor. °a. You may drive when you are no longer taking prescription pain medication, you can comfortably wear a seatbelt, and you can safely maneuver your car and apply brakes. °b. RETURN TO WORK:  __________________________________________________________ °9. You should see your doctor in the office for a follow-up appointment approximately 2-3 weeks after your surgery.  Make sure that you call for this appointment within a day or two after you arrive home to insure a convenient appointment time. °10. OTHER INSTRUCTIONS: __________________________________________________________________________________________________________________________ __________________________________________________________________________________________________________________________ °WHEN TO CALL YOUR DOCTOR: °1. Fever over 101.0 °2. Inability to urinate °3. Continued bleeding from incision. °4. Increased pain, redness, or drainage from the incision. °5. Increasing abdominal pain ° °The clinic staff is available to answer your questions during regular business hours.  Please don’t hesitate to call and ask to speak to one of the nurses for clinical concerns.  If you have a medical emergency, go to the nearest emergency room or call 911.  A surgeon from [facility] is always on call at the hospital. °[address], [address], [place], [place]  [address] ? [address] [address], [place], [place]   [address] °[phone] ? [phone] ? FAX [phone] °Web site:   [email or web address] °

## 2011-10-07 LAB — GLUCOSE, CAPILLARY: Glucose-Capillary: 141 mg/dL — ABNORMAL HIGH (ref 70–99)

## 2011-10-08 ENCOUNTER — Telehealth (INDEPENDENT_AMBULATORY_CARE_PROVIDER_SITE_OTHER): Payer: Self-pay | Admitting: General Surgery

## 2011-10-08 NOTE — Telephone Encounter (Signed)
Pt calling for refill of pain meds and to ask for Xanax for her anxiety attacks.  Pt instructed she will need to contact her PCP for Xanax, but will call in pain meds for her.  She understands.  Hydrocodone 5/ 325 mg,  # 30, 1 po Q4-6H prn pain, NO refill called to [facility]:  [phone].

## 2011-10-11 ENCOUNTER — Telehealth (INDEPENDENT_AMBULATORY_CARE_PROVIDER_SITE_OTHER): Payer: Self-pay | Admitting: General Surgery

## 2011-10-11 ENCOUNTER — Encounter (HOSPITAL_COMMUNITY): Payer: Self-pay | Admitting: *Deleted

## 2011-10-11 ENCOUNTER — Inpatient Hospital Stay (HOSPITAL_COMMUNITY)
Admission: EM | Admit: 2011-10-11 | Discharge: 2011-10-13 | DRG: 418 | Disposition: A | Discharge status: Home / Self Care | Payer: Self-pay | Source: Ambulatory Visit | Attending: General Surgery | Admitting: General Surgery

## 2011-10-11 DIAGNOSIS — K8020 Calculus of gallbladder without cholecystitis without obstruction: Secondary | ICD-10-CM

## 2011-10-11 DIAGNOSIS — K8010 Calculus of gallbladder with chronic cholecystitis without obstruction: Principal | ICD-10-CM | POA: Diagnosis present

## 2011-10-11 DIAGNOSIS — I10 Essential (primary) hypertension: Secondary | ICD-10-CM | POA: Diagnosis present

## 2011-10-11 DIAGNOSIS — Z6841 Body Mass Index (BMI) 40.0 and over, adult: Secondary | ICD-10-CM

## 2011-10-11 LAB — COMPREHENSIVE METABOLIC PANEL
AST: 24 U/L (ref 0–37)
Albumin: 3.7 g/dL (ref 3.5–5.2)
Calcium: 9.5 mg/dL (ref 8.4–10.5)
Chloride: 106 mEq/L (ref 96–112)
Creatinine, Ser: 0.6 mg/dL (ref 0.50–1.10)
Total Protein: 7.2 g/dL (ref 6.0–8.3)

## 2011-10-11 LAB — DIFFERENTIAL
Basophils Absolute: 0 10*3/uL (ref 0.0–0.1)
Basophils Relative: 0 % (ref 0–1)
Eosinophils Absolute: 0.2 10*3/uL (ref 0.0–0.7)
Monocytes Absolute: 0.6 10*3/uL (ref 0.1–1.0)
Neutro Abs: 5.8 10*3/uL (ref 1.7–7.7)

## 2011-10-11 LAB — LIPASE, BLOOD: Lipase: 30 U/L (ref 11–59)

## 2011-10-11 LAB — URINE MICROSCOPIC-ADD ON

## 2011-10-11 LAB — CBC
HCT: 34.8 % — ABNORMAL LOW (ref 36.0–46.0)
MCH: 23.8 pg — ABNORMAL LOW (ref 26.0–34.0)
MCHC: 31.3 g/dL (ref 30.0–36.0)
RDW: 15.2 % (ref 11.5–15.5)

## 2011-10-11 LAB — URINALYSIS, ROUTINE W REFLEX MICROSCOPIC
Protein, ur: NEGATIVE mg/dL
Urobilinogen, UA: 1 mg/dL (ref 0.0–1.0)

## 2011-10-11 LAB — PREGNANCY, URINE: Preg Test, Ur: NEGATIVE

## 2011-10-11 MED ORDER — ONDANSETRON HCL 4 MG/2ML IJ SOLN
4.0000 mg | Freq: Once | INTRAMUSCULAR | Status: AC
  Administered 2011-10-11: 4 mg via INTRAVENOUS
  Filled 2011-10-11: qty 2

## 2011-10-11 MED ORDER — HYDROMORPHONE HCL PF 1 MG/ML IJ SOLN
1.0000 mg | Freq: Once | INTRAMUSCULAR | Status: AC
  Administered 2011-10-11: 1 mg via INTRAVENOUS
  Filled 2011-10-11: qty 1

## 2011-10-11 MED ORDER — SODIUM CHLORIDE 0.9 % IV BOLUS (SEPSIS)
1000.0000 mL | Freq: Once | INTRAVENOUS | Status: AC
  Administered 2011-10-11: 1000 mL via INTRAVENOUS

## 2011-10-11 NOTE — Telephone Encounter (Signed)
Recently had lap appendectomy.  Wound opened up 2 days ago and she is still having pain in the area.  Temps of 100.  I recommended that she come in for evaluation.

## 2011-10-11 NOTE — ED Notes (Signed)
The pt had an appendectomy on Monday and since then  She has had increasing pain and temp  With no appetite.  lmp monday

## 2011-10-11 NOTE — ED Provider Notes (Signed)
History     CSN: [id number]  Arrival date & time 10/11/11  2000   First MD Initiated Contact with Patient 10/11/11 2101      Chief Complaint  Patient presents with  . Abdominal Pain    (Consider location/radiation/quality/duration/timing/severity/associated sxs/prior treatment) HPI Comments: Pt states that she had appendectomy on 5/20 pt states that she was discharged the next day and she has had decreased appetite and nausea since the surgery:pt states that she developed right upper abdominal pain yesterday and the pain is persistent:pt states that she has had a temp as high as 100.4  Patient is a 41 y.o. female presenting with abdominal pain. The history is provided by the patient. No language interpreter was used.  Abdominal Pain The primary symptoms of the illness include abdominal pain and nausea. The primary symptoms of the illness do not include vomiting or diarrhea. The current episode started more than 2 days ago. The onset of the illness was gradual. The problem has not changed since onset.   Past Medical History  Diagnosis Date  . Hypertension   . Anxiety   . Panic attack   . TMJ disease   . Acid reflux     Past Surgical History  Procedure Date  . Eye surgery   . Dilitation and curettage   . Ankle surgery   . Laparoscopic appendectomy 10/05/2011    Procedure: APPENDECTOMY LAPAROSCOPIC;  Surgeon: Valarie Merino, MD;  Location: [facility] OR;  Service: General;  Laterality: N/A;    Family History  Problem Relation Age of Onset  . Hypertension Other     History  Substance Use Topics  . Smoking status: Current Everyday Smoker  . Smokeless tobacco: Not on file  . Alcohol Use: No    OB History    Grav Para Term Preterm Abortions TAB SAB Ect Mult Living                  Review of Systems  Constitutional: Negative.   Respiratory: Negative.   Cardiovascular: Negative.   Gastrointestinal: Positive for nausea and abdominal pain. Negative for vomiting and  diarrhea.    Allergies  Morphine and related; Other; and Paroxetine  Home Medications   Current Outpatient Rx  Name Route Sig Dispense Refill  . ALPRAZOLAM 1 MG PO TABS Oral Take 1 mg by mouth 3 (three) times daily.    . IBUPROFEN 200 MG PO TABS Oral Take 800 mg by mouth every 6 (six) hours as needed. For pain    . LISINOPRIL 10 MG PO TABS Oral Take 10 mg by mouth daily.      . OXYCODONE-ACETAMINOPHEN 5-325 MG PO TABS Oral Take 1-2 tablets by mouth every 4 (four) hours as needed.      BP 142/95  Pulse 80  Temp(Src) 98.4 F (36.9 C) (Oral)  Resp 20  SpO2 99%  LMP 10/01/2011  Physical Exam  Nursing note and vitals reviewed. Constitutional: She appears well-developed and well-nourished.  HENT:  Head: Normocephalic and atraumatic.  Eyes: Conjunctivae and EOM are normal.  Neck: Neck supple.  Cardiovascular: Normal rate and regular rhythm.   Pulmonary/Chest: Effort normal and breath sounds normal.  Abdominal: Soft. Bowel sounds are normal.       ruq tenderness:no right lower abdominal tenderness  Musculoskeletal: Normal range of motion.  Skin:       Well healing laparoscopic scars noted to the abdomen    ED Course  Procedures (including critical care time)  Labs Reviewed  URINALYSIS,  ROUTINE W REFLEX MICROSCOPIC - Abnormal; Notable for the following:    APPearance CLOUDY (*)    Hgb urine dipstick LARGE (*)    Leukocytes, UA SMALL (*)    All other components within normal limits  CBC - Abnormal; Notable for the following:    Hemoglobin 10.9 (*)    HCT 34.8 (*)    MCV 76.0 (*)    MCH 23.8 (*)    Platelets 411 (*)    All other components within normal limits  COMPREHENSIVE METABOLIC PANEL - Abnormal; Notable for the following:    Potassium 3.2 (*)    Total Bilirubin 0.2 (*)    All other components within normal limits  URINE MICROSCOPIC-ADD ON - Abnormal; Notable for the following:    Squamous Epithelial / LPF MANY (*)    Bacteria, UA FEW (*)    All other  components within normal limits  PREGNANCY, URINE  DIFFERENTIAL  LIPASE, BLOOD  URINE CULTURE   US Abdomen Complete  10/12/2011  *RADIOLOGY REPORT*  Clinical Data:  6 days status post appendectomy, now with abdominal pain.  COMPLETE ABDOMINAL ULTRASOUND  Comparison:  None.  Findings:  Gallbladder:  Possible gallstone impacted in the neck of the gallbladder, 8-9 mm in size.  There is moderate gallbladder wall thickening of 4-5 mm.  No biliary ductal dilatation.   Diffuse abdominal tenderness without focal sonographic tenderness in the right upper quadrant.  Common bile duct:  5 mm.  Liver:  No focal lesion identified. Diffuse increased echogenicity suggesting steatosis.  IVC:  Appears normal.  Pancreas:  None visualized due to bowel gas.  Spleen:  Normal measuring 7.9 cm length appear  Right Kidney: Normal measuring 11.3 cm in length.  No focal lesions or hydronephrosis.  Left Kidney:  Normal measuring 12.0 cm in length.  No focal lesions or hydronephrosis.  Abdominal aorta:  No aneurysm identified.  IMPRESSION: Suspected 9 mm gallstone impacted in the neck of the gallbladder with thickened gallbladder wall; acute cholecystitis not excluded. There was no definite gallstone  or gallbladder wall thickening noted on prior  CT.  Original Report Authenticated By: Elsie Stain, M.D.     1. Gallstone (impacted)       MDM  Discussed findings with surgery and they are coming to evaluate pt and pt is continuing to have pain        Teressa Lower, NP 10/12/11 [phone]

## 2011-10-12 ENCOUNTER — Inpatient Hospital Stay (HOSPITAL_COMMUNITY): Payer: Self-pay | Admitting: Certified Registered Nurse Anesthetist

## 2011-10-12 ENCOUNTER — Encounter (HOSPITAL_COMMUNITY): Payer: Self-pay | Admitting: Certified Registered Nurse Anesthetist

## 2011-10-12 ENCOUNTER — Inpatient Hospital Stay (HOSPITAL_COMMUNITY): Payer: Self-pay

## 2011-10-12 ENCOUNTER — Encounter (HOSPITAL_COMMUNITY): Admission: EM | Disposition: A | Discharge status: Home / Self Care | Payer: Self-pay | Source: Ambulatory Visit

## 2011-10-12 ENCOUNTER — Emergency Department (HOSPITAL_COMMUNITY): Payer: Self-pay

## 2011-10-12 DIAGNOSIS — K8010 Calculus of gallbladder with chronic cholecystitis without obstruction: Secondary | ICD-10-CM

## 2011-10-12 DIAGNOSIS — K824 Cholesterolosis of gallbladder: Secondary | ICD-10-CM

## 2011-10-12 HISTORY — PX: CHOLECYSTECTOMY: SHX55

## 2011-10-12 LAB — CBC
HCT: 33 % — ABNORMAL LOW (ref 36.0–46.0)
Hemoglobin: 10 g/dL — ABNORMAL LOW (ref 12.0–15.0)
MCHC: 30.3 g/dL (ref 30.0–36.0)
MCV: 78 fL (ref 78.0–100.0)

## 2011-10-12 LAB — CREATININE, SERUM
GFR calc Af Amer: >90 mL/min (ref >90)
GFR calc non Af Amer: >90 mL/min (ref >90)

## 2011-10-12 SURGERY — LAPAROSCOPIC CHOLECYSTECTOMY WITH INTRAOPERATIVE CHOLANGIOGRAM
Anesthesia: General | Site: Abdomen | Wound class: Clean Contaminated

## 2011-10-12 MED ORDER — ONDANSETRON HCL 4 MG/2ML IJ SOLN: 4.0000 mg | Freq: Four times a day (QID) | INTRAMUSCULAR | Status: DC | PRN

## 2011-10-12 MED ORDER — PANTOPRAZOLE SODIUM 40 MG IV SOLR
40.0000 mg | Freq: Every day | INTRAVENOUS | Status: DC
  Administered 2011-10-12: 40 mg via INTRAVENOUS
  Filled 2011-10-12 (×2): qty 40

## 2011-10-12 MED ORDER — ENOXAPARIN SODIUM 40 MG/0.4ML ~~LOC~~ SOLN
40.0000 mg | SUBCUTANEOUS | Status: DC
  Administered 2011-10-13: 40 mg via SUBCUTANEOUS
  Filled 2011-10-12 (×2): qty 0.4

## 2011-10-12 MED ORDER — ONDANSETRON HCL 4 MG/2ML IJ SOLN
INTRAMUSCULAR | Status: DC | PRN
  Administered 2011-10-12: 4 mg via INTRAVENOUS

## 2011-10-12 MED ORDER — CIPROFLOXACIN IN D5W 400 MG/200ML IV SOLN
400.0000 mg | Freq: Two times a day (BID) | INTRAVENOUS | Status: DC
  Administered 2011-10-12 – 2011-10-13 (×3): 400 mg via INTRAVENOUS
  Filled 2011-10-12 (×6): qty 200

## 2011-10-12 MED ORDER — DEXAMETHASONE SODIUM PHOSPHATE 4 MG/ML IJ SOLN
INTRAMUSCULAR | Status: DC | PRN
  Administered 2011-10-12: 4 mg via INTRAVENOUS

## 2011-10-12 MED ORDER — HYDROMORPHONE HCL PF 1 MG/ML IJ SOLN
0.5000 mg | INTRAMUSCULAR | Status: DC | PRN
  Administered 2011-10-12 – 2011-10-13 (×7): 1 mg via INTRAVENOUS
  Filled 2011-10-12 (×7): qty 1

## 2011-10-12 MED ORDER — LIDOCAINE HCL (CARDIAC) 20 MG/ML IV SOLN
INTRAVENOUS | Status: DC | PRN
  Administered 2011-10-12: 80 mg via INTRAVENOUS

## 2011-10-12 MED ORDER — NEOSTIGMINE METHYLSULFATE 1 MG/ML IJ SOLN
INTRAMUSCULAR | Status: DC | PRN
  Administered 2011-10-12: 5 mg via INTRAVENOUS

## 2011-10-12 MED ORDER — LACTATED RINGERS IV SOLN
INTRAVENOUS | Status: DC | PRN
  Administered 2011-10-12: 09:00:00 via INTRAVENOUS

## 2011-10-12 MED ORDER — LIDOCAINE HCL 4 % MT SOLN
OROMUCOSAL | Status: DC | PRN
  Administered 2011-10-12: 4 mL via TOPICAL

## 2011-10-12 MED ORDER — ONDANSETRON HCL 4 MG/2ML IJ SOLN: 4.0000 mg | Freq: Once | INTRAMUSCULAR | Status: DC | PRN

## 2011-10-12 MED ORDER — VECURONIUM BROMIDE 10 MG IV SOLR
INTRAVENOUS | Status: DC | PRN
  Administered 2011-10-12: 1 mg via INTRAVENOUS

## 2011-10-12 MED ORDER — LISINOPRIL 10 MG PO TABS
10.0000 mg | ORAL_TABLET | Freq: Every day | ORAL | Status: DC
  Administered 2011-10-12 – 2011-10-13 (×2): 10 mg via ORAL
  Filled 2011-10-12 (×2): qty 1

## 2011-10-12 MED ORDER — PROPOFOL 10 MG/ML IV EMUL
INTRAVENOUS | Status: DC | PRN
  Administered 2011-10-12: 200 mg via INTRAVENOUS

## 2011-10-12 MED ORDER — SODIUM CHLORIDE 0.9 % IR SOLN
Status: DC | PRN
  Administered 2011-10-12: 1000 mL

## 2011-10-12 MED ORDER — FENTANYL CITRATE 0.05 MG/ML IJ SOLN
INTRAMUSCULAR | Status: DC | PRN
  Administered 2011-10-12 (×2): 50 ug via INTRAVENOUS
  Administered 2011-10-12 (×2): 100 ug via INTRAVENOUS
  Administered 2011-10-12 (×2): 50 ug via INTRAVENOUS

## 2011-10-12 MED ORDER — GLYCOPYRROLATE 0.2 MG/ML IJ SOLN
INTRAMUSCULAR | Status: DC | PRN
  Administered 2011-10-12: .8 mg via INTRAVENOUS

## 2011-10-12 MED ORDER — CIPROFLOXACIN IN D5W 400 MG/200ML IV SOLN
400.0000 mg | Freq: Two times a day (BID) | INTRAVENOUS | Status: DC
  Filled 2011-10-12: qty 200

## 2011-10-12 MED ORDER — ALPRAZOLAM 0.5 MG PO TABS
1.0000 mg | ORAL_TABLET | Freq: Three times a day (TID) | ORAL | Status: DC
  Administered 2011-10-12 – 2011-10-13 (×4): 1 mg via ORAL
  Filled 2011-10-12 (×4): qty 2
  Filled 2011-10-12: qty 1

## 2011-10-12 MED ORDER — BUPIVACAINE-EPINEPHRINE 0.25% -1:200000 IJ SOLN
INTRAMUSCULAR | Status: DC | PRN
  Administered 2011-10-12: 12 mL

## 2011-10-12 MED ORDER — ONDANSETRON HCL 4 MG PO TABS: 4.0000 mg | ORAL_TABLET | Freq: Four times a day (QID) | ORAL | Status: DC | PRN

## 2011-10-12 MED ORDER — MIDAZOLAM HCL 5 MG/5ML IJ SOLN
INTRAMUSCULAR | Status: DC | PRN
  Administered 2011-10-12: 2 mg via INTRAVENOUS

## 2011-10-12 MED ORDER — ROCURONIUM BROMIDE 100 MG/10ML IV SOLN
INTRAVENOUS | Status: DC | PRN
  Administered 2011-10-12: 50 mg via INTRAVENOUS

## 2011-10-12 MED ORDER — OXYCODONE-ACETAMINOPHEN 5-325 MG PO TABS
1.0000 | ORAL_TABLET | ORAL | Status: DC | PRN
  Administered 2011-10-12 – 2011-10-13 (×3): 1 via ORAL
  Filled 2011-10-12 (×3): qty 1

## 2011-10-12 MED ORDER — HYDROMORPHONE HCL PF 1 MG/ML IJ SOLN
1.0000 mg | Freq: Once | INTRAMUSCULAR | Status: AC
  Administered 2011-10-12: 1 mg via INTRAVENOUS
  Filled 2011-10-12: qty 1

## 2011-10-12 MED ORDER — SODIUM CHLORIDE 0.9 % IV SOLN
INTRAVENOUS | Status: DC | PRN
  Administered 2011-10-12: 10:00:00

## 2011-10-12 MED ORDER — KCL IN DEXTROSE-NACL 20-5-0.45 MEQ/L-%-% IV SOLN
INTRAVENOUS | Status: DC
  Administered 2011-10-12 – 2011-10-13 (×4): via INTRAVENOUS
  Filled 2011-10-12 (×8): qty 1000

## 2011-10-12 MED ORDER — HYDROMORPHONE HCL PF 1 MG/ML IJ SOLN
0.2500 mg | INTRAMUSCULAR | Status: DC | PRN
Start: 2011-10-12 — End: 2011-10-12
  Administered 2011-10-12 (×2): 0.5 mg via INTRAVENOUS

## 2011-10-12 SURGICAL SUPPLY — 49 items
APPLIER CLIP 5 13 M/L LIGAMAX5 (MISCELLANEOUS) ×2
APPLIER CLIP ROT 10 11.4 M/L (STAPLE)
BENZOIN TINCTURE PRP APPL 2/3 (GAUZE/BANDAGES/DRESSINGS) ×2 IMPLANT
BLADE SURG ROTATE 9660 (MISCELLANEOUS) IMPLANT
CANISTER SUCTION 2500CC (MISCELLANEOUS) ×2 IMPLANT
CHLORAPREP W/TINT 26ML (MISCELLANEOUS) ×2 IMPLANT
CLIP APPLIE 5 13 M/L LIGAMAX5 (MISCELLANEOUS) ×1 IMPLANT
CLIP APPLIE ROT 10 11.4 M/L (STAPLE) IMPLANT
CLOTH BEACON ORANGE TIMEOUT ST (SAFETY) ×2 IMPLANT
COVER MAYO STAND STRL (DRAPES) ×2 IMPLANT
COVER SURGICAL LIGHT HANDLE (MISCELLANEOUS) ×2 IMPLANT
DECANTER SPIKE VIAL GLASS SM (MISCELLANEOUS) IMPLANT
DERMABOND ADVANCED (GAUZE/BANDAGES/DRESSINGS) ×1
DERMABOND ADVANCED .7 DNX12 (GAUZE/BANDAGES/DRESSINGS) ×1 IMPLANT
DRAPE C-ARM 42X72 X-RAY (DRAPES) ×2 IMPLANT
DRAPE UTILITY 15X26 W/TAPE STR (DRAPE) ×4 IMPLANT
DRSG TEGADERM 4X4.75 (GAUZE/BANDAGES/DRESSINGS) ×2 IMPLANT
ELECT REM PT RETURN 9FT ADLT (ELECTROSURGICAL) ×2
ELECTRODE REM PT RTRN 9FT ADLT (ELECTROSURGICAL) ×1 IMPLANT
FILTER SMOKE EVAC LAPAROSHD (FILTER) ×2 IMPLANT
GAUZE SPONGE 2X2 8PLY STRL LF (GAUZE/BANDAGES/DRESSINGS) ×1 IMPLANT
GLOVE BIO SURGEON STRL SZ7 (GLOVE) ×2 IMPLANT
GLOVE BIO SURGEON STRL SZ7.5 (GLOVE) ×4 IMPLANT
GLOVE BIOGEL PI IND STRL 7.0 (GLOVE) ×1 IMPLANT
GLOVE BIOGEL PI IND STRL 7.5 (GLOVE) ×2 IMPLANT
GLOVE BIOGEL PI INDICATOR 7.0 (GLOVE) ×1
GLOVE BIOGEL PI INDICATOR 7.5 (GLOVE) ×2
GLOVE SS BIOGEL STRL SZ 6.5 (GLOVE) ×1 IMPLANT
GLOVE SUPERSENSE BIOGEL SZ 6.5 (GLOVE) ×1
GOWN STRL NON-REIN LRG LVL3 (GOWN DISPOSABLE) ×8 IMPLANT
KIT BASIN OR (CUSTOM PROCEDURE TRAY) ×2 IMPLANT
KIT ROOM TURNOVER OR (KITS) ×2 IMPLANT
NS IRRIG 1000ML POUR BTL (IV SOLUTION) ×2 IMPLANT
PAD ARMBOARD 7.5X6 YLW CONV (MISCELLANEOUS) ×2 IMPLANT
POUCH SPECIMEN RETRIEVAL 10MM (ENDOMECHANICALS) ×2 IMPLANT
SCISSORS LAP 5X35 DISP (ENDOMECHANICALS) IMPLANT
SET CHOLANGIOGRAPH 5 50 .035 (SET/KITS/TRAYS/PACK) ×2 IMPLANT
SET IRRIG TUBING LAPAROSCOPIC (IRRIGATION / IRRIGATOR) ×2 IMPLANT
SLEEVE ENDOPATH XCEL 5M (ENDOMECHANICALS) ×2 IMPLANT
SPECIMEN JAR SMALL (MISCELLANEOUS) ×2 IMPLANT
SPONGE GAUZE 2X2 STER 10/PKG (GAUZE/BANDAGES/DRESSINGS) ×1
SUT MNCRL AB 4-0 PS2 18 (SUTURE) ×2 IMPLANT
TOWEL OR 17X24 6PK STRL BLUE (TOWEL DISPOSABLE) ×2 IMPLANT
TOWEL OR 17X26 10 PK STRL BLUE (TOWEL DISPOSABLE) ×2 IMPLANT
TOWEL OR NON WOVEN STRL DISP B (DISPOSABLE) ×2 IMPLANT
TRAY LAPAROSCOPIC (CUSTOM PROCEDURE TRAY) ×2 IMPLANT
TROCAR XCEL BLUNT TIP 100MML (ENDOMECHANICALS) ×2 IMPLANT
TROCAR XCEL NON-BLD 11X100MML (ENDOMECHANICALS) ×2 IMPLANT
TROCAR XCEL NON-BLD 5MMX100MML (ENDOMECHANICALS) ×2 IMPLANT

## 2011-10-12 NOTE — Anesthesia Postprocedure Evaluation (Signed)
  Anesthesia Post-op Note  Patient: [name]  Procedure(s) Performed: Procedure(s) (LRB): LAPAROSCOPIC CHOLECYSTECTOMY WITH INTRAOPERATIVE CHOLANGIOGRAM (N/A)  Patient Location: PACU  Anesthesia Type: General  Level of Consciousness: awake, alert  and oriented  Airway and Oxygen Therapy: Patient Spontanous Breathing and Patient connected to nasal cannula oxygen  Post-op Pain: mild  Post-op Assessment: Post-op Vital signs reviewed  Post-op Vital Signs: Reviewed  Complications: No apparent anesthesia complications

## 2011-10-12 NOTE — Transfer of Care (Signed)
Immediate Anesthesia Transfer of Care Note  Patient: [name]  Procedure(s) Performed: Procedure(s) (LRB): LAPAROSCOPIC CHOLECYSTECTOMY WITH INTRAOPERATIVE CHOLANGIOGRAM (N/A)  Patient Location: PACU  Anesthesia Type: General  Level of Consciousness: awake, alert  and oriented  Airway & Oxygen Therapy: Patient Spontanous Breathing and Patient connected to nasal cannula oxygen  Post-op Assessment: Report given to PACU RN and Post -op Vital signs reviewed and stable  Post vital signs: Reviewed and stable  Complications: No apparent anesthesia complications

## 2011-10-12 NOTE — Preoperative (Signed)
Beta Blockers   Reason not to administer Beta Blockers:Not Applicable 

## 2011-10-12 NOTE — Progress Notes (Signed)
Subjective: Pain is not better.  Objective: Vital signs in last 24 hours: Temp:  [98 F (36.7 C)-98.8 F (37.1 C)] 98 F (36.7 C) (05/27 0605) Pulse Rate:  [58-80] 58  (05/27 0605) Resp:  [18-20] 18  (05/27 0605) BP: (115-142)/(63-95) 117/67 mmHg (05/27 0605) SpO2:  [90 %-99 %] 98 % (05/27 0605) Weight:  [276 lb 8 oz (125.42 kg)] 276 lb 8 oz (125.42 kg) (05/27 0315)    Intake/Output from previous day: 05/26 0701 - 05/27 0700 In: -  Out: 300 [Urine:300] Intake/Output this shift:    PE: Abd-soft, RUQ tenderness, incisions in subumbilical/right side/LLQ are clean and intact  Lab Results:   [facility] 10/11/11 2116  WBC 9.1  HGB 10.9*  HCT 34.8*  PLT 411*   BMET  Basename 10/11/11 2116  NA 142  K 3.2*  CL 106  CO2 24  GLUCOSE 90  BUN 7  CREATININE 0.60  CALCIUM 9.5   PT/INR No results found for this basename: LABPROT:2,INR:2 in the last 72 hours Comprehensive Metabolic Panel:    Component Value Date/Time   NA 142 10/11/2011 2116   K 3.2* 10/11/2011 2116   CL 106 10/11/2011 2116   CO2 24 10/11/2011 2116   BUN 7 10/11/2011 2116   CREATININE 0.60 10/11/2011 2116   GLUCOSE 90 10/11/2011 2116   CALCIUM 9.5 10/11/2011 2116   AST 24 10/11/2011 2116   ALT 18 10/11/2011 2116   ALKPHOS 108 10/11/2011 2116   BILITOT 0.2* 10/11/2011 2116   PROT 7.2 10/11/2011 2116   ALBUMIN 3.7 10/11/2011 2116     Studies/Results: US Abdomen Complete  10/12/2011  *RADIOLOGY REPORT*  Clinical Data:  6 days status post appendectomy, now with abdominal pain.  COMPLETE ABDOMINAL ULTRASOUND  Comparison:  None.  Findings:  Gallbladder:  Possible gallstone impacted in the neck of the gallbladder, 8-9 mm in size.  There is moderate gallbladder wall thickening of 4-5 mm.  No biliary ductal dilatation.   Diffuse abdominal tenderness without focal sonographic tenderness in the right upper quadrant.  Common bile duct:  5 mm.  Liver:  No focal lesion identified. Diffuse increased echogenicity suggesting  steatosis.  IVC:  Appears normal.  Pancreas:  None visualized due to bowel gas.  Spleen:  Normal measuring 7.9 cm length appear  Right Kidney: Normal measuring 11.3 cm in length.  No focal lesions or hydronephrosis.  Left Kidney:  Normal measuring 12.0 cm in length.  No focal lesions or hydronephrosis.  Abdominal aorta:  No aneurysm identified.  IMPRESSION: Suspected 9 mm gallstone impacted in the neck of the gallbladder with thickened gallbladder wall; acute cholecystitis not excluded. There was no definite gallstone  or gallbladder wall thickening noted on prior  CT.  Original Report Authenticated By: Elsie Stain, M.D.    Anti-infectives: Anti-infectives     Start     Dose/Rate Route Frequency Ordered Stop   10/12/11 0400   ciprofloxacin (CIPRO) IVPB 400 mg        400 mg 200 mL/hr over 60 Minutes Intravenous Every 12 hours 10/12/11 0314            Assessment Active Problems:  Cholelithiasis with possible acute cholecystitis-sxs not improved.    LOS: 1 day   Plan: Laparoscopic, possible open cholecystectomy.  I have explained the procedure, risks, and aftercare of cholecystectomy.  Risks include but are not limited to bleeding, infection, wound problems, anesthesia, diarrhea, bile leak, injury to common bile duct/liver/intestine.  She seems to understand and agrees  to proceed.    Adolph Pollack 10/12/2011

## 2011-10-12 NOTE — Op Note (Addendum)
Laparoscopic Cholecystectomy with IOC Procedure Note  Indications: This patient presents with symptomatic gallbladder disease and will undergo laparoscopic cholecystectomy.  Pre-operative Diagnosis: Calculus of gallbladder with other cholecystitis, without mention of obstruction  Post-operative Diagnosis: Same  Surgeon: Kent Riendeau K.   Assistants: none  Anesthesia: General endotracheal anesthesia  ASA Class: 2  Procedure Details  The patient was seen again in the Holding Room. The risks, benefits, complications, treatment options, and expected outcomes were discussed with the patient. The possibilities of reaction to medication, pulmonary aspiration, perforation of viscus, bleeding, recurrent infection, finding a normal gallbladder, the need for additional procedures, failure to diagnose a condition, the possible need to convert to an open procedure, and creating a complication requiring transfusion or operation were discussed with the patient. The likelihood of improving the patient's symptoms with return to their baseline status is good.  The patient and/or family concurred with the proposed plan, giving informed consent. The site of surgery properly noted. The patient was taken to Operating Room, identified as [name] and the procedure verified as Laparoscopic Cholecystectomy with Intraoperative Cholangiogram. A Time Out was held and the above information confirmed.  Prior to the induction of general anesthesia, antibiotic prophylaxis was administered. General endotracheal anesthesia was then administered and tolerated well. After the induction, the abdomen was prepped with Chloraprep and draped in the sterile fashion. The patient was positioned in the supine position.  Local anesthetic agent was injected into the skin near the umbilicus and an incision made. We dissected down to the abdominal fascia with blunt dissection.  The fascia was incised vertically and we entered the  peritoneal cavity bluntly.  A pursestring suture of 0-Vicryl was placed around the fascial opening.  The Hasson cannula was inserted and secured with the stay suture.  Pneumoperitoneum was then created with CO2 and tolerated well without any adverse changes in the patient's vital signs. An 11-mm port was placed in the subxiphoid position.  Two 5-mm ports were placed in the right upper quadrant. All skin incisions were infiltrated with a local anesthetic agent before making the incision and placing the trocars.   We positioned the patient in reverse Trendelenburg, tilted slightly to the patient's left.  The gallbladder was identified, the fundus grasped and retracted cephalad. Adhesions were lysed bluntly and with the electrocautery where indicated, taking care not to injure any adjacent organs or viscus. The infundibulum was grasped and retracted laterally, exposing the peritoneum overlying the triangle of Calot. This was then divided and exposed in a blunt fashion. A critical view of the cystic duct and cystic artery was obtained.  The cystic duct was clearly identified and bluntly dissected circumferentially. The cystic duct was ligated with a clip distally.   An incision was made in the cystic duct and the [facility] cholangiogram catheter introduced. The catheter was secured using a clip. A cholangiogram was then obtained which showed good visualization of the distal and proximal biliary tree with no sign of filling defects or obstruction.  Contrast flowed easily into the duodenum. The catheter was then removed.   The cystic duct was then ligated with clips and divided. The cystic artery was identified, dissected free, ligated with clips and divided as well.   The gallbladder was dissected from the liver bed in retrograde fashion with the electrocautery. The gallbladder was removed and placed in an Endocatch sac. The liver bed was irrigated and inspected. Hemostasis was achieved with the electrocautery. Copious  irrigation was utilized and was repeatedly aspirated until clear.  I inspected the area around the cecum and visualized the staple line at the appendiceal stump.  The staple line was intact.  There was no bleeding or purulence noted in the pelvis.  The gallbladder and Endocatch sac were then removed through the umbilical port site.  The pursestring suture was used to close the umbilical fascia.    We again inspected the right upper quadrant for hemostasis.  Pneumoperitoneum was released as we removed the trocars.  4-0 Monocryl was used to close the skin.   Benzoin, steri-strips, and clean dressings were applied. The patient was then extubated and brought to the recovery room in stable condition. Instrument, sponge, and needle counts were correct at closure and at the conclusion of the case.   Findings: Cholecystitis with Cholelithiasis  Estimated Blood Loss: Minimal         Drains: none          Specimens: Gallbladder           Complications: None; patient tolerated the procedure well.         Disposition: PACU - hemodynamically stable.         Condition: stable  Wilmon Arms. Corliss Skains, MD, [facility]  10/12/2011 11:09 AM

## 2011-10-12 NOTE — Anesthesia Preprocedure Evaluation (Signed)
Anesthesia Evaluation  Patient identified by MRN, date of birth, ID band Patient awake    Reviewed: Allergy & Precautions, H&P , NPO status , Patient's Chart, lab work & pertinent test results  Airway Mallampati: I TM Distance: >3 FB Neck ROM: Full    Dental  (+) Teeth Intact and Dental Advisory Given   Pulmonary  breath sounds clear to auscultation        Cardiovascular Rhythm:Regular Rate:Normal     Neuro/Psych    GI/Hepatic   Endo/Other  Morbid obesity  Renal/GU      Musculoskeletal   Abdominal   Peds  Hematology   Anesthesia Other Findings   Reproductive/Obstetrics                           Anesthesia Physical Anesthesia Plan  ASA: II  Anesthesia Plan: General   Post-op Pain Management:    Induction: Intravenous  Airway Management Planned: Oral ETT  Additional Equipment:   Intra-op Plan:   Post-operative Plan: Extubation in OR  Informed Consent: I have reviewed the patients History and Physical, chart, labs and discussed the procedure including the risks, benefits and alternatives for the proposed anesthesia with the patient or authorized representative who has indicated his/her understanding and acceptance.   Dental advisory given  Plan Discussed with: CRNA, Anesthesiologist and Surgeon  Anesthesia Plan Comments:         Anesthesia Quick Evaluation  

## 2011-10-12 NOTE — Anesthesia Procedure Notes (Signed)
Procedure Name: Intubation Date/Time: 10/12/2011 9:32 AM Performed by: Margaree Mackintosh Pre-anesthesia Checklist: Patient identified, Timeout performed, Emergency Drugs available, Suction available and Patient being monitored Patient Re-evaluated:Patient Re-evaluated prior to inductionOxygen Delivery Method: Circle system utilized Preoxygenation: Pre-oxygenation with 100% oxygen Intubation Type: IV induction Ventilation: Mask ventilation without difficulty Laryngoscope Size: Mac and 4 Grade View: Grade I Tube type: Oral Tube size: 7.5 mm Number of attempts: 1 Airway Equipment and Method: Stylet and LTA kit utilized Placement Confirmation: ETT inserted through vocal cords under direct vision,  positive ETCO2 and breath sounds checked- equal and bilateral Secured at: 21 cm Tube secured with: Tape Dental Injury: Teeth and Oropharynx as per pre-operative assessment

## 2011-10-12 NOTE — H&P (Signed)
[name] is an 41 y.o. female.   Chief Complaint: Right-sided abdominal pain HPI: This underwent laparoscopic appendectomy by Dr. Daphine Deutscher on May 20. She went home on May 21, but has had ongoing right-sided abdominal pain. Her left lower quadrant incision also opened up a little bit but she has been doing wound care. Her pain continued and she spoke to the physician on-call for our practice at [facility]. He recommended she come to the emergency department. Workup included abdominal ultrasound which demonstrates a 9 mm stone in the neck of the gallbladder with gallbladder wall thickening concerning for acute cholecystitis. Laboratory tests are normal. Patient's pain and initially went away with pain medication however it returned.  Past Medical History  Diagnosis Date  . Hypertension   . Anxiety   . Panic attack   . TMJ disease   . Acid reflux     Past Surgical History  Procedure Date  . Eye surgery   . Dilitation and curettage   . Ankle surgery   . Laparoscopic appendectomy 10/05/2011    Procedure: APPENDECTOMY LAPAROSCOPIC;  Surgeon: Valarie Merino, MD;  Location: [facility] OR;  Service: General;  Laterality: N/A;    Family History  Problem Relation Age of Onset  . Hypertension Other    Social History:  reports that she has been smoking.  She does not have any smokeless tobacco history on file. She reports that she does not drink alcohol or use illicit drugs.  Allergies:  Allergies  Allergen Reactions  . Morphine And Related Hives  . Other     All antidepressents.   REACTION: Delerium, panic attacks, etc.  . Paroxetine     REACTION: delerium and panic reaction     (Not in a hospital admission)  Results for orders placed during the hospital encounter of 10/11/11 (from the past 48 hour(s))  PREGNANCY, URINE     Status: Normal   Collection Time   10/11/11  8:52 PM      Component Value Range Comment   Preg Test, Ur NEGATIVE  NEGATIVE    URINALYSIS, ROUTINE W REFLEX  MICROSCOPIC     Status: Abnormal   Collection Time   10/11/11  8:53 PM      Component Value Range Comment   Color, Urine YELLOW  YELLOW     APPearance CLOUDY (*) CLEAR     Specific Gravity, Urine 1.008  1.005 - 1.030     pH 6.5  5.0 - 8.0     Glucose, UA NEGATIVE  NEGATIVE (mg/dL)    Hgb urine dipstick LARGE (*) NEGATIVE     Bilirubin Urine NEGATIVE  NEGATIVE     Ketones, ur NEGATIVE  NEGATIVE (mg/dL)    Protein, ur NEGATIVE  NEGATIVE (mg/dL)    Urobilinogen, UA 1.0  0.0 - 1.0 (mg/dL)    Nitrite NEGATIVE  NEGATIVE     Leukocytes, UA SMALL (*) NEGATIVE    URINE MICROSCOPIC-ADD ON     Status: Abnormal   Collection Time   10/11/11  8:53 PM      Component Value Range Comment   Squamous Epithelial / LPF MANY (*) RARE     WBC, UA 3-6  <3 (WBC/hpf)    RBC / HPF 7-10  <3 (RBC/hpf)    Bacteria, UA FEW (*) RARE    CBC     Status: Abnormal   Collection Time   10/11/11  9:16 PM      Component Value Range Comment   WBC  9.1  4.0 - 10.5 (K/uL)    RBC 4.58  3.87 - 5.11 (MIL/uL)    Hemoglobin 10.9 (*) 12.0 - 15.0 (g/dL)    HCT 91.4 (*) 78.2 - 46.0 (%)    MCV 76.0 (*) 78.0 - 100.0 (fL)    MCH 23.8 (*) 26.0 - 34.0 (pg)    MCHC 31.3  30.0 - 36.0 (g/dL)    RDW 95.6  21.3 - 08.6 (%)    Platelets 411 (*) 150 - 400 (K/uL)   DIFFERENTIAL     Status: Normal   Collection Time   10/11/11  9:16 PM      Component Value Range Comment   Neutrophils Relative 64  43 - 77 (%)    Neutro Abs 5.8  1.7 - 7.7 (K/uL)    Lymphocytes Relative 27  12 - 46 (%)    Lymphs Abs 2.5  0.7 - 4.0 (K/uL)    Monocytes Relative 6  3 - 12 (%)    Monocytes Absolute 0.6  0.1 - 1.0 (K/uL)    Eosinophils Relative 2  0 - 5 (%)    Eosinophils Absolute 0.2  0.0 - 0.7 (K/uL)    Basophils Relative 0  0 - 1 (%)    Basophils Absolute 0.0  0.0 - 0.1 (K/uL)   COMPREHENSIVE METABOLIC PANEL     Status: Abnormal   Collection Time   10/11/11  9:16 PM      Component Value Range Comment   Sodium 142  135 - 145 (mEq/L)    Potassium 3.2 (*)  3.5 - 5.1 (mEq/L)    Chloride 106  96 - 112 (mEq/L)    CO2 24  19 - 32 (mEq/L)    Glucose, Bld 90  70 - 99 (mg/dL)    BUN 7  6 - 23 (mg/dL)    Creatinine, Ser 5.78  0.50 - 1.10 (mg/dL)    Calcium 9.5  8.4 - 10.5 (mg/dL)    Total Protein 7.2  6.0 - 8.3 (g/dL)    Albumin 3.7  3.5 - 5.2 (g/dL)    AST 24  0 - 37 (U/L)    ALT 18  0 - 35 (U/L)    Alkaline Phosphatase 108  39 - 117 (U/L)    Total Bilirubin 0.2 (*) 0.3 - 1.2 (mg/dL)    GFR calc non Af Amer >90  >90 (mL/min)    GFR calc Af Amer >90  >90 (mL/min)   LIPASE, BLOOD     Status: Normal   Collection Time   10/11/11  9:17 PM      Component Value Range Comment   Lipase 30  11 - 59 (U/L)    US Abdomen Complete  10/12/2011  *RADIOLOGY REPORT*  Clinical Data:  6 days status post appendectomy, now with abdominal pain.  COMPLETE ABDOMINAL ULTRASOUND  Comparison:  None.  Findings:  Gallbladder:  Possible gallstone impacted in the neck of the gallbladder, 8-9 mm in size.  There is moderate gallbladder wall thickening of 4-5 mm.  No biliary ductal dilatation.   Diffuse abdominal tenderness without focal sonographic tenderness in the right upper quadrant.  Common bile duct:  5 mm.  Liver:  No focal lesion identified. Diffuse increased echogenicity suggesting steatosis.  IVC:  Appears normal.  Pancreas:  None visualized due to bowel gas.  Spleen:  Normal measuring 7.9 cm length appear  Right Kidney: Normal measuring 11.3 cm in length.  No focal lesions or hydronephrosis.  Left Kidney:  Normal  measuring 12.0 cm in length.  No focal lesions or hydronephrosis.  Abdominal aorta:  No aneurysm identified.  IMPRESSION: Suspected 9 mm gallstone impacted in the neck of the gallbladder with thickened gallbladder wall; acute cholecystitis not excluded. There was no definite gallstone  or gallbladder wall thickening noted on prior  CT.  Original Report Authenticated By: Elsie Stain, M.D.    Review of Systems  Constitutional: Positive for fever. Negative for  chills.  HENT: Negative.   Eyes: Negative.   Respiratory: Negative for cough, sputum production and shortness of breath.   Cardiovascular: Negative for chest pain and palpitations.  Gastrointestinal: Positive for nausea and abdominal pain. Negative for vomiting, diarrhea and constipation.  Genitourinary: Negative.   Musculoskeletal: Negative.   Skin: Negative.   Neurological: Negative.     Blood pressure 142/95, pulse 80, temperature 98.4 F (36.9 C), temperature source Oral, resp. rate 20, last menstrual period 10/01/2011, SpO2 99.00%. Physical Exam  Constitutional: She is oriented to person, place, and time. She appears well-developed and well-nourished.  HENT:  Head: Atraumatic.  Mouth/Throat: No oropharyngeal exudate.  Eyes: EOM are normal. Pupils are equal, round, and reactive to light.  Neck: Normal range of motion. Neck supple. No JVD present.  Cardiovascular: Normal rate, regular rhythm, normal heart sounds and intact distal pulses.   No murmur heard. Respiratory: Effort normal. No stridor. No respiratory distress. She has no wheezes. She has no rales.  GI: Soft. She exhibits no distension. There is tenderness. There is no rebound and no guarding.       Tenderness is along the right side, mostly right mid abdomen. All 3 incisions are intact and healing well. Bowel sounds are hypoactive.  Musculoskeletal: Normal range of motion.  Neurological: She is alert and oriented to person, place, and time.  Skin: Skin is warm and dry.     Assessment/Plan Early postoperative period status post laparoscopic appendectomy now with cholelithiasis and possible cholecystitis. Will admit, give IV antibiotics, bowel rest, and reexamine her in the morning. At that time we will proceed with HIDA scan vs laparoscopic cholecystectomy. Procedure, risks, benefits were discussed in detail with the patient. I answered her questions.  Tysha Grismore E 10/12/2011, 1:32 AM

## 2011-10-13 ENCOUNTER — Encounter (HOSPITAL_COMMUNITY): Payer: Self-pay | Admitting: Surgery

## 2011-10-13 LAB — URINE CULTURE

## 2011-10-13 MED ORDER — OXYCODONE-ACETAMINOPHEN 5-325 MG PO TABS
1.0000 | ORAL_TABLET | ORAL | Status: DC | PRN
  Administered 2011-10-13: 2 via ORAL
  Administered 2011-10-13: 1 via ORAL
  Administered 2011-10-13: 2 via ORAL
  Filled 2011-10-13 (×3): qty 2

## 2011-10-13 MED ORDER — SODIUM CHLORIDE 0.9 % IJ SOLN: 3.0000 mL | INTRAMUSCULAR | Status: DC | PRN

## 2011-10-13 MED ORDER — PANTOPRAZOLE SODIUM 40 MG PO TBEC
40.0000 mg | DELAYED_RELEASE_TABLET | Freq: Every day | ORAL | Status: DC
  Administered 2011-10-13: 40 mg via ORAL
  Filled 2011-10-13: qty 1

## 2011-10-13 MED ORDER — OXYCODONE-ACETAMINOPHEN 5-325 MG PO TABS: 1.0000 | ORAL_TABLET | ORAL | Status: DC | PRN

## 2011-10-13 NOTE — Discharge Summary (Signed)
Agree with above.  Home after lunch.

## 2011-10-13 NOTE — Discharge Summary (Signed)
Patient ID: [name] MRN: [id number] DOB/AGE: May 04, 1971 40 y.o.  Admit date: 10/11/2011 Discharge date: 10/13/2011  Procedures: lap chole  Consults: None  Reason for Admission: This is a 41 yo female who underwent a lap appy about a week ago.  She continued to have some right sided abdominal pain and came back to the [facility].  She had an ultrasound that revealed a stone in the gb neck and some wall thickening.  We were asked to see her.  Admission Diagnoses:  1. Biliary colic  Hospital Course: The patient was admitted.  She was taken to the operating room where she underwent a lap chole.  She tolerated the procedure well.  She says on POD# 1, that the pain she was having is gone and she is just sore.  She is tolerating a regular diet and her pain is well controlled with oral pain medication.  PE: Abd: soft, tender appropriately, +BS, ND, incisions c/d/i  Discharge Diagnoses:  1. Biliary colic 2. S/p lap chole 3. S/p recent lap appy  Discharge Medications: Medication List  As of 10/13/2011  9:22 AM   TAKE these medications         ALPRAZolam 1 MG tablet   Commonly known as: XANAX   Take 1 mg by mouth 3 (three) times daily.      ibuprofen 200 MG tablet   Commonly known as: ADVIL,MOTRIN   Take 800 mg by mouth every 6 (six) hours as needed. For pain      lisinopril 10 MG tablet   Commonly known as: PRINIVIL,ZESTRIL   Take 10 mg by mouth daily.      oxyCODONE-acetaminophen 5-325 MG per tablet   Commonly known as: PERCOCET   Take 1-2 tablets by mouth every 4 (four) hours as needed.      oxyCODONE-acetaminophen 5-325 MG per tablet   Commonly known as: PERCOCET   Take 1-2 tablets by mouth every 4 (four) hours as needed.            Discharge Instructions: Follow-up Information    Follow up with Wynona Luna., MD. Schedule an appointment as soon as possible for a visit in 2 weeks.   Contact information:   [employer] [address] [place] [address] [phone]          Signed: Letha Cape 10/13/2011, 9:22 AM

## 2011-10-13 NOTE — Progress Notes (Signed)
Discharge home. Home discharge instruction given. 

## 2011-10-13 NOTE — Discharge Instructions (Signed)
[facility] ______[facility] °LAPAROSCOPIC SURGERY: POST OP INSTRUCTIONS °Always review your discharge instruction sheet given to you by the facility where your surgery was performed. °IF YOU HAVE DISABILITY OR FAMILY LEAVE FORMS, YOU MUST BRING THEM TO THE OFFICE FOR PROCESSING.   °DO NOT GIVE THEM TO YOUR DOCTOR. ° °1. A prescription for pain medication may be given to you upon discharge.  Take your pain medication as prescribed, if needed.  If narcotic pain medicine is not needed, then you may take acetaminophen (Tylenol) or ibuprofen (Advil) as needed. °2. Take your usually prescribed medications unless otherwise directed. °3. If you need a refill on your pain medication, please contact your pharmacy.  They will contact our office to request authorization. Prescriptions will not be filled after 5pm or on week-ends. °4. You should follow a light diet the first few days after arrival home, such as soup and crackers, etc.  Be sure to include lots of fluids daily. °5. Most patients will experience some swelling and bruising in the area of the incisions.  Ice packs will help.  Swelling and bruising can take several days to resolve.  °6. It is common to experience some constipation if taking pain medication after surgery.  Increasing fluid intake and taking a stool softener (such as Colace) will usually help or prevent this problem from occurring.  A mild laxative (Milk of Magnesia or Miralax) should be taken according to package instructions if there are no bowel movements after 48 hours. °7. Unless discharge instructions indicate otherwise, you may remove your bandages 24-48 hours after surgery, and you may shower at that time.  You may have steri-strips (small skin tapes) in place directly over the incision.  These strips should be left on the skin for 7-10 days.  If your surgeon used skin glue on the incision, you may shower in 24 hours.  The glue will flake off over the next 2-3 weeks.  Any sutures or  staples will be removed at the office during your follow-up visit. °8. ACTIVITIES:  You may resume regular (light) daily activities beginning the next day--such as daily self-care, walking, climbing stairs--gradually increasing activities as tolerated.  You may have sexual intercourse when it is comfortable.  Refrain from any heavy lifting or straining until approved by your doctor. °a. You may drive when you are no longer taking prescription pain medication, you can comfortably wear a seatbelt, and you can safely maneuver your car and apply brakes. °b. RETURN TO WORK:  __________________________________________________________ °9. You should see your doctor in the office for a follow-up appointment approximately 2-3 weeks after your surgery.  Make sure that you call for this appointment within a day or two after you arrive home to insure a convenient appointment time. °10. OTHER INSTRUCTIONS: __________________________________________________________________________________________________________________________ __________________________________________________________________________________________________________________________ °WHEN TO CALL YOUR DOCTOR: °1. Fever over 101.0 °2. Inability to urinate °3. Continued bleeding from incision. °4. Increased pain, redness, or drainage from the incision. °5. Increasing abdominal pain ° °The clinic staff is available to answer your questions during regular business hours.  Please don’t hesitate to call and ask to speak to one of the nurses for clinical concerns.  If you have a medical emergency, go to the nearest emergency room or call 911.  A surgeon from [facility] is always on call at the hospital. °[address], [address], [place], [place]  [address] ? [address] [address], [place], [place]   [address] °[phone] ? [phone] ? FAX [phone] °Web site:   [email or web address] °

## 2011-10-15 ENCOUNTER — Telehealth (INDEPENDENT_AMBULATORY_CARE_PROVIDER_SITE_OTHER): Payer: Self-pay | Admitting: General Surgery

## 2011-10-15 NOTE — Telephone Encounter (Signed)
PT CALLED REQUESTING PAIN MEDICATION REFILL/PERCOCET GIVEN AFTER SECOND SURGERY ON Monday/ LAP CHOLECYSTECTOMY BY DR. TSUEI/ IST SURGERY WAS LAST WEEK WITH DR. MARTIN FOR APPENDECTOMY. SHE REQUESTED SOMETHING THAT COULD BE CALLED IN. I REVIEWED THIS WITH DR. BLACKMAN AND HE SAID TO CALL IN VICODIN 7.5/500 #30/ 1-2-Q 4-6 HOURS PRN PAIN. RX CALLED TO [facility]/ [phone]/ PT AWARE/GY

## 2011-10-18 ENCOUNTER — Encounter (HOSPITAL_COMMUNITY): Payer: Self-pay | Admitting: Emergency Medicine

## 2011-10-18 ENCOUNTER — Emergency Department (HOSPITAL_COMMUNITY): Payer: Self-pay

## 2011-10-18 ENCOUNTER — Emergency Department (HOSPITAL_COMMUNITY)
Admission: EM | Admit: 2011-10-18 | Discharge: 2011-10-19 | Disposition: A | Discharge status: Home / Self Care | Payer: Self-pay | Attending: Emergency Medicine | Admitting: Emergency Medicine

## 2011-10-18 DIAGNOSIS — R1031 Right lower quadrant pain: Secondary | ICD-10-CM | POA: Insufficient documentation

## 2011-10-18 DIAGNOSIS — R109 Unspecified abdominal pain: Secondary | ICD-10-CM

## 2011-10-18 DIAGNOSIS — R110 Nausea: Secondary | ICD-10-CM | POA: Insufficient documentation

## 2011-10-18 DIAGNOSIS — I10 Essential (primary) hypertension: Secondary | ICD-10-CM | POA: Insufficient documentation

## 2011-10-18 DIAGNOSIS — K219 Gastro-esophageal reflux disease without esophagitis: Secondary | ICD-10-CM | POA: Insufficient documentation

## 2011-10-18 DIAGNOSIS — Z9089 Acquired absence of other organs: Secondary | ICD-10-CM | POA: Insufficient documentation

## 2011-10-18 DIAGNOSIS — Z9889 Other specified postprocedural states: Secondary | ICD-10-CM | POA: Insufficient documentation

## 2011-10-18 LAB — CBC
HCT: 37.8 % (ref 36.0–46.0)
Hemoglobin: 11.6 g/dL — ABNORMAL LOW (ref 12.0–15.0)
MCH: 23.8 pg — ABNORMAL LOW (ref 26.0–34.0)
RBC: 4.88 MIL/uL (ref 3.87–5.11)

## 2011-10-18 LAB — DIFFERENTIAL
Lymphs Abs: 3 10*3/uL (ref 0.7–4.0)
Monocytes Relative: 7 % (ref 3–12)
Neutro Abs: 5.8 10*3/uL (ref 1.7–7.7)
Neutrophils Relative %: 60 % (ref 43–77)

## 2011-10-18 LAB — COMPREHENSIVE METABOLIC PANEL
AST: 13 U/L (ref 0–37)
Albumin: 3.9 g/dL (ref 3.5–5.2)
Alkaline Phosphatase: 120 U/L — ABNORMAL HIGH (ref 39–117)
BUN: 7 mg/dL (ref 6–23)
CO2: 24 mEq/L (ref 19–32)
Chloride: 103 mEq/L (ref 96–112)
Potassium: 3.5 mEq/L (ref 3.5–5.1)
Total Bilirubin: 0.2 mg/dL — ABNORMAL LOW (ref 0.3–1.2)

## 2011-10-18 LAB — URINALYSIS, ROUTINE W REFLEX MICROSCOPIC
Bilirubin Urine: NEGATIVE
Glucose, UA: NEGATIVE mg/dL
Ketones, ur: NEGATIVE mg/dL
pH: 6.5 (ref 5.0–8.0)

## 2011-10-18 LAB — URINE MICROSCOPIC-ADD ON

## 2011-10-18 MED ORDER — ONDANSETRON HCL 4 MG/2ML IJ SOLN
4.0000 mg | Freq: Once | INTRAMUSCULAR | Status: AC
  Administered 2011-10-18: 4 mg via INTRAVENOUS
  Filled 2011-10-18: qty 2

## 2011-10-18 MED ORDER — SODIUM CHLORIDE 0.9 % IV BOLUS (SEPSIS)
250.0000 mL | Freq: Once | INTRAVENOUS | Status: AC
  Administered 2011-10-18: 250 mL via INTRAVENOUS

## 2011-10-18 MED ORDER — HYDROMORPHONE HCL PF 1 MG/ML IJ SOLN
1.0000 mg | Freq: Once | INTRAMUSCULAR | Status: AC
  Administered 2011-10-18: 1 mg via INTRAVENOUS
  Filled 2011-10-18: qty 1

## 2011-10-18 MED ORDER — IOHEXOL 300 MG/ML  SOLN: 20.0000 mL | INTRAMUSCULAR | Status: AC

## 2011-10-18 MED ORDER — IOHEXOL 300 MG/ML  SOLN
100.0000 mL | Freq: Once | INTRAMUSCULAR | Status: AC | PRN
  Administered 2011-10-18: 100 mL via INTRAVENOUS

## 2011-10-18 MED ORDER — SODIUM CHLORIDE 0.9 % IV SOLN
INTRAVENOUS | Status: DC
  Administered 2011-10-18 (×2): via INTRAVENOUS

## 2011-10-18 NOTE — ED Provider Notes (Signed)
Medical screening examination/treatment/procedure(s) were conducted as a shared visit with non-physician practitioner(s) and myself.  I personally evaluated the patient during the encounter   Shelda Jakes, MD 10/18/11 2302

## 2011-10-18 NOTE — ED Notes (Signed)
Pt returned from testing and placed in CDU 11 at this time.

## 2011-10-18 NOTE — ED Provider Notes (Addendum)
History     CSN: [id number]  Arrival date & time 10/18/11  1631   First MD Initiated Contact with Patient 10/18/11 1658      Chief Complaint  Patient presents with  . Post-op Problem    (Consider location/radiation/quality/duration/timing/severity/associated sxs/prior treatment) The history is provided by the patient.   patient is a 41 year old female presenting with right lower quadrant abdominal pain radiates to right flank and some to the suprapubic area pain is described as an 8/10 onset of the pain was a little bit last evening with some nausea got worse today today vomited 7 times no diarrhea. Pain described as sharp. No fever.  Patient status post appendectomy 13 days ago and cholecystectomy 6 days ago both on the [facility].     Past Medical History  Diagnosis Date  . Hypertension   . Anxiety   . Panic attack   . TMJ disease   . Acid reflux     Past Surgical History  Procedure Date  . Eye surgery   . Dilitation and curettage   . Ankle surgery   . Laparoscopic appendectomy 10/05/2011    Procedure: APPENDECTOMY LAPAROSCOPIC;  Surgeon: Valarie Merino, MD;  Location: [facility] OR;  Service: General;  Laterality: N/A;  . Cholecystectomy 10/12/2011    Procedure: LAPAROSCOPIC CHOLECYSTECTOMY WITH INTRAOPERATIVE CHOLANGIOGRAM;  Surgeon: Wilmon Arms. Corliss Skains, MD;  Location: [facility] OR;  Service: General;  Laterality: N/A;  . Appendectomy     Family History  Problem Relation Age of Onset  . Hypertension Other     History  Substance Use Topics  . Smoking status: Current Everyday Smoker  . Smokeless tobacco: Not on file  . Alcohol Use: No    OB History    Grav Para Term Preterm Abortions TAB SAB Ect Mult Living                  Review of Systems  Constitutional: Positive for fever.  HENT: Negative for congestion and neck pain.   Respiratory: Negative for shortness of breath.   Cardiovascular: Positive for chest pain.  Gastrointestinal: Positive for nausea,  vomiting and abdominal pain. Negative for diarrhea.  Genitourinary: Negative for dysuria.  Musculoskeletal: Negative for back pain.  Skin: Negative for rash.  Neurological: Negative for headaches.  Hematological: Does not bruise/bleed easily.    Allergies  Morphine and related; Other; and Paroxetine  Home Medications   Current Outpatient Rx  Name Route Sig Dispense Refill  . ALPRAZOLAM 1 MG PO TABS Oral Take 1 mg by mouth 3 (three) times daily.    . IBUPROFEN 200 MG PO TABS Oral Take 800 mg by mouth every 6 (six) hours as needed. For pain    . LISINOPRIL 10 MG PO TABS Oral Take 10 mg by mouth daily.      . OXYCODONE-ACETAMINOPHEN 5-325 MG PO TABS Oral Take 1-2 tablets by mouth every 4 (four) hours as needed. For pain.      BP 119/65  Pulse 75  Temp(Src) 98.6 F (37 C) (Oral)  Resp 16  SpO2 96%  LMP 10/01/2011  Physical Exam  Nursing note and vitals reviewed. Constitutional: She is oriented to person, place, and time. She appears well-developed and well-nourished. No distress.  HENT:  Head: Normocephalic and atraumatic.  Mouth/Throat: Oropharynx is clear and moist.  Eyes: Conjunctivae and EOM are normal. Pupils are equal, round, and reactive to light.  Neck: Normal range of motion. Neck supple.  Cardiovascular: Normal rate, regular rhythm and normal  heart sounds.   No murmur heard. Pulmonary/Chest: Effort normal and breath sounds normal. No respiratory distress. She has no wheezes. She has no rales. She exhibits no tenderness.  Abdominal: Soft. Bowel sounds are normal. There is tenderness. There is no guarding.  Musculoskeletal: Normal range of motion.  Neurological: She is alert and oriented to person, place, and time. No cranial nerve deficit. Coordination normal.  Skin: Skin is warm and dry. No rash noted.    ED Course  Procedures (including critical care time)  Labs Reviewed  CBC - Abnormal; Notable for the following:    Hemoglobin 11.6 (*)    MCV 77.5 (*)     MCH 23.8 (*)    Platelets 447 (*)    All other components within normal limits  COMPREHENSIVE METABOLIC PANEL - Abnormal; Notable for the following:    Alkaline Phosphatase 120 (*)    Total Bilirubin 0.2 (*)    All other components within normal limits  URINALYSIS, ROUTINE W REFLEX MICROSCOPIC - Abnormal; Notable for the following:    Hgb urine dipstick TRACE (*)    All other components within normal limits  URINE MICROSCOPIC-ADD ON - Abnormal; Notable for the following:    Squamous Epithelial / LPF FEW (*)    All other components within normal limits  DIFFERENTIAL  LIPASE, BLOOD   Dg Chest 2 View  10/18/2011  *RADIOLOGY REPORT*  Clinical Data: Fever.  Chest pain.  CHEST - 2 VIEW  Comparison: PA and lateral chest 07/07/2010.  Findings: Lungs are clear.  Heart size is normal.  No pneumothorax or pleural effusion.  IMPRESSION: Negative chest.  Original Report Authenticated By: Bernadene Bell. Maricela Curet, M.D.   Ct Abdomen Pelvis W Contrast  10/18/2011  *RADIOLOGY REPORT*  Clinical Data: Status post appendectomy 13 days ago. Status post cholecystectomy 6 days ago.  Nausea and vomiting.  CT ABDOMEN AND PELVIS WITH CONTRAST  Technique:  Multidetector CT imaging of the abdomen and pelvis was performed following the standard protocol during bolus administration of intravenous contrast.  Contrast: OMNIPAQUE IOHEXOL 300 MG/ML  SOLN  Comparison: CT abdomen and pelvis 10/05/2011.  Findings: Low lung bases are clear.  No pleural or pericardial effusion.  Cholecystectomy clips are noted.  There is no fluid in the gallbladder fossa.  The biliary tree is unremarkable.  The liver, spleen, adrenal glands, pancreas and kidneys appear normal.  Postoperative change of appendectomy is noted.  No abscess or other complicating feature is identified.  The stomach and small and large bowel appear normal.  There is new soft tissue fullness in the right adnexa.  Also seen are two new cystic lesions within the myometrium in  the fundus of the uterus.  One of these has a septation. On the prior study, the patient's inflamed appendix was nearly contiguous with the right ovary.  Left adnexa is unremarkable.  No lymphadenopathy is identified.  IMPRESSION:  1.  New soft tissue fullness in the right adnexa with two new cystic lesions in the myometrium of the fundus of the uterus may be due to infectious or inflammatory process related to appendicitis. Recommend correlation with physical examination.  Pelvic ultrasound could also be used for further evaluation. 2.  Status post cholecystectomy without evidence of complication.  Original Report Authenticated By: Bernadene Bell. Maricela Curet, M.D.   Results for orders placed during the hospital encounter of 10/18/11  CBC      Component Value Range   WBC 9.6  4.0 - 10.5 (K/uL)   RBC  4.88  3.87 - 5.11 (MIL/uL)   Hemoglobin 11.6 (*) 12.0 - 15.0 (g/dL)   HCT 16.1  09.6 - 04.5 (%)   MCV 77.5 (*) 78.0 - 100.0 (fL)   MCH 23.8 (*) 26.0 - 34.0 (pg)   MCHC 30.7  30.0 - 36.0 (g/dL)   RDW 40.9  81.1 - 91.4 (%)   Platelets 447 (*) 150 - 400 (K/uL)  DIFFERENTIAL      Component Value Range   Neutrophils Relative 60  43 - 77 (%)   Neutro Abs 5.8  1.7 - 7.7 (K/uL)   Lymphocytes Relative 31  12 - 46 (%)   Lymphs Abs 3.0  0.7 - 4.0 (K/uL)   Monocytes Relative 7  3 - 12 (%)   Monocytes Absolute 0.6  0.1 - 1.0 (K/uL)   Eosinophils Relative 2  0 - 5 (%)   Eosinophils Absolute 0.2  0.0 - 0.7 (K/uL)   Basophils Relative 0  0 - 1 (%)   Basophils Absolute 0.0  0.0 - 0.1 (K/uL)  COMPREHENSIVE METABOLIC PANEL      Component Value Range   Sodium 141  135 - 145 (mEq/L)   Potassium 3.5  3.5 - 5.1 (mEq/L)   Chloride 103  96 - 112 (mEq/L)   CO2 24  19 - 32 (mEq/L)   Glucose, Bld 93  70 - 99 (mg/dL)   BUN 7  6 - 23 (mg/dL)   Creatinine, Ser 7.82  0.50 - 1.10 (mg/dL)   Calcium 9.8  8.4 - 95.6 (mg/dL)   Total Protein 7.6  6.0 - 8.3 (g/dL)   Albumin 3.9  3.5 - 5.2 (g/dL)   AST 13  0 - 37 (U/L)   ALT 13   0 - 35 (U/L)   Alkaline Phosphatase 120 (*) 39 - 117 (U/L)   Total Bilirubin 0.2 (*) 0.3 - 1.2 (mg/dL)   GFR calc non Af Amer >90  >90 (mL/min)   GFR calc Af Amer >90  >90 (mL/min)  LIPASE, BLOOD      Component Value Range   Lipase 19  11 - 59 (U/L)  URINALYSIS, ROUTINE W REFLEX MICROSCOPIC      Component Value Range   Color, Urine YELLOW  YELLOW    APPearance CLEAR  CLEAR    Specific Gravity, Urine 1.007  1.005 - 1.030    pH 6.5  5.0 - 8.0    Glucose, UA NEGATIVE  NEGATIVE (mg/dL)   Hgb urine dipstick TRACE (*) NEGATIVE    Bilirubin Urine NEGATIVE  NEGATIVE    Ketones, ur NEGATIVE  NEGATIVE (mg/dL)   Protein, ur NEGATIVE  NEGATIVE (mg/dL)   Urobilinogen, UA 0.2  0.0 - 1.0 (mg/dL)   Nitrite NEGATIVE  NEGATIVE    Leukocytes, UA NEGATIVE  NEGATIVE   URINE MICROSCOPIC-ADD ON      Component Value Range   Squamous Epithelial / LPF FEW (*) RARE    RBC / HPF 0-2  <3 (RBC/hpf)   Bacteria, UA RARE  RARE      1. Abdominal pain       MDM    As noted above patient is status post the appendectomy 13 days ago and status post cholecystectomy 6 days ago started with right lower quadrant abdominal pain and persistent vomiting today her chronic pain started a little bit last evening vomited 7 times today. No diarrhea abdominal pain right lower quadrant is movable but over course the right flank. Workup here tonight sitting and 4 no  leukocytosis urinalysis negative for urinary tract infection liver function tests Sniffing abnormalities CT scan raised the suspicion of inflammation in the right lower corner that may involve the adnexa or the uterus or maybe even the inside the uterus. Contacted the [facility] on call they reviewed his CT scan recommended pelvic ultrasound to evaluate further.  Patient improved in ED with pain medicine patient moved to CDU awaiting results. Will be a shared visit with the CDU mid-level. Once results of pelvic ultrasound are known [facility] will be recontacted for their opinion.   Medical screening examination/treatment/procedure(s) were conducted as a shared visit with non-physician practitioner(s) and myself.  I personally evaluated the patient during the encounter      Shelda Jakes, MD 10/18/11 1610  Shelda Jakes, MD 10/18/11 [phone]

## 2011-10-18 NOTE — ED Notes (Signed)
Pt currently in [place], will assess upon arrival in CDU. Verbal report received from Di Kindle., RN.

## 2011-10-18 NOTE — ED Provider Notes (Signed)
10:47 PM Patient to be moved to CDU holding for pelvic US.  Sign out received from Dr Deretha Emory who will also sign patient out to oncoming doctor at 11:00 change of shift.  Patient with recent appendectomy and cholecystectomy within past 2 weeks, now with RLQ abdominal pain and N/V.  CT showed concern for adnexal fullness and uterine lesions.  Patient is to move to CDU pending results.  Currently still in [place], has not come to CDU yet.  Care to be assumed by oncoming doctor at 11:00pm.    [name], [place] 10/18/11 2250

## 2011-10-18 NOTE — ED Notes (Signed)
Pt presents to department for evaluation of generalized abdominal pain. States recent appendectomy and cholecystectomy. Pt states N/V, worsening pain and decreased urinary output. States she was doing good after surgery then symptoms became worse suddenly. 8/10 pain at the time. Abdomen tender to palpation. States several episodes of vomiting today. Denies fever. Bowel sounds present all quadrants. She is conscious alert and oriented x4. No signs of distress noted.

## 2011-10-18 NOTE — ED Notes (Signed)
Pt returned from ultrasound with complaints of right sided abdominal pain 9/10. MD to be made aware and follow up, will continue to monitor pt. Plan of care is updated with verbal understanding, will continue to monitor pt.

## 2011-10-18 NOTE — ED Notes (Addendum)
Pt had appendectomy 13 days ago and cholecystectomy 6 days ago.  Pt has continued abd pain that has been worse since yesterday.  Decreased urinary output.  Reports nausea and vomited x 7 today.

## 2011-10-19 ENCOUNTER — Other Ambulatory Visit (INDEPENDENT_AMBULATORY_CARE_PROVIDER_SITE_OTHER): Payer: Self-pay | Admitting: Surgery

## 2011-10-19 MED ORDER — OXYCODONE-ACETAMINOPHEN 5-325 MG PO TABS
2.0000 | ORAL_TABLET | Freq: Once | ORAL | Status: AC
  Administered 2011-10-19: 2 via ORAL
  Filled 2011-10-19: qty 2

## 2011-10-19 MED ORDER — AMOXICILLIN-POT CLAVULANATE 875-125 MG PO TABS: 1.0000 | ORAL_TABLET | Freq: Two times a day (BID) | ORAL | Status: DC

## 2011-10-19 MED ORDER — ONDANSETRON 4 MG PO TBDP: 4.0000 mg | ORAL_TABLET | Freq: Three times a day (TID) | ORAL | Status: AC | PRN

## 2011-10-19 MED ORDER — PROMETHAZINE HCL 25 MG PO TABS: 25.0000 mg | ORAL_TABLET | Freq: Four times a day (QID) | ORAL | Status: DC | PRN

## 2011-10-19 MED ORDER — ONDANSETRON HCL 4 MG/2ML IJ SOLN
4.0000 mg | Freq: Once | INTRAMUSCULAR | Status: AC
  Administered 2011-10-19: 4 mg via INTRAVENOUS
  Filled 2011-10-19: qty 2

## 2011-10-19 MED ORDER — PROMETHAZINE HCL 25 MG RE SUPP: 25.0000 mg | Freq: Four times a day (QID) | RECTAL | Status: DC | PRN

## 2011-10-19 MED ORDER — OXYCODONE-ACETAMINOPHEN 5-325 MG PO TABS: 1.0000 | ORAL_TABLET | ORAL | Status: DC | PRN

## 2011-10-19 NOTE — Discharge Instructions (Signed)
I have discussed your care with the general surgeon on call Dr. Luisa Hart who is in agreement that your symptoms are likely related to a gynecologic source. Please see the phone numbers above and arrange a close followup appointment. In the meantime please take the antibiotic as prescribed, the pain medication and the nausea medicine as needed. If your symptoms should persist or worsen return to the [facility] hospital as outlined.

## 2011-10-19 NOTE — Telephone Encounter (Signed)
Can this pt have refill on this med/ the pharmacy still it to Dr Magnus Ivan for a refill but you did surgery for GB back on 10-12-11

## 2011-10-19 NOTE — ED Provider Notes (Signed)
  Physical Exam  BP 119/60  Pulse 69  Temp(Src) 98.1 F (36.7 C) (Oral)  Resp 18  SpO2 97%  LMP 10/01/2011  Physical Exam  ED Course  Procedures  MDM Shunt accepted at change of shift, status post appendectomy 2 weeks ago, cholecystectomy 6 days ago and now with recurrent suprapubic type pain which has been going on intermittently but gradually getting worse. States had approximately 7 or 8 episodes of vomiting prior to arrival, pain has been well-controlled, nausea well-controlled while in the emergency department. CT scan findings and laboratory data reviewed with the patient, general surgery has been contacted by prior ER physician,  Now that ultrasound is back we'll recontact surgery for recommendations, patient reevaluated and has suprapubic pain to palpation, non-peritoneal, no right lower quadrant pain, no fever, no leukocytosis  Care has been discussed again with Dr. Luisa Hart of general surgery who is in agreement with the patient following up with gynecology, pain medication nausea medication and Augmentin provided for the patient for home. I have discussed with the patient all of her findings and she is able to express her understanding for the plan to followup with gynecology, followup information and phone numbers given.    Vida Roller, MD 10/19/11 [phone]

## 2011-10-19 NOTE — ED Provider Notes (Signed)
Medical screening examination/treatment/procedure(s) were performed by non-physician practitioner and as supervising physician I was immediately available for consultation/collaboration.  Tee Richeson, MD 10/19/11 1543 

## 2011-10-19 NOTE — ED Notes (Signed)
MD at bedside. 

## 2011-10-20 ENCOUNTER — Telehealth (INDEPENDENT_AMBULATORY_CARE_PROVIDER_SITE_OTHER): Payer: Self-pay | Admitting: General Surgery

## 2011-10-20 ENCOUNTER — Inpatient Hospital Stay (HOSPITAL_COMMUNITY)
Admission: AD | Admit: 2011-10-20 | Discharge: 2011-10-20 | Disposition: A | Discharge status: Home / Self Care | Payer: Self-pay | Source: Ambulatory Visit | Attending: Obstetrics & Gynecology | Admitting: Obstetrics & Gynecology

## 2011-10-20 ENCOUNTER — Encounter (HOSPITAL_COMMUNITY): Payer: Self-pay | Admitting: *Deleted

## 2011-10-20 DIAGNOSIS — R109 Unspecified abdominal pain: Secondary | ICD-10-CM | POA: Insufficient documentation

## 2011-10-20 DIAGNOSIS — R112 Nausea with vomiting, unspecified: Secondary | ICD-10-CM | POA: Insufficient documentation

## 2011-10-20 DIAGNOSIS — M549 Dorsalgia, unspecified: Secondary | ICD-10-CM | POA: Insufficient documentation

## 2011-10-20 DIAGNOSIS — N83209 Unspecified ovarian cyst, unspecified side: Secondary | ICD-10-CM | POA: Insufficient documentation

## 2011-10-20 DIAGNOSIS — N949 Unspecified condition associated with female genital organs and menstrual cycle: Secondary | ICD-10-CM | POA: Insufficient documentation

## 2011-10-20 DIAGNOSIS — N938 Other specified abnormal uterine and vaginal bleeding: Secondary | ICD-10-CM

## 2011-10-20 DIAGNOSIS — N719 Inflammatory disease of uterus, unspecified: Secondary | ICD-10-CM

## 2011-10-20 LAB — URINALYSIS, ROUTINE W REFLEX MICROSCOPIC
Nitrite: NEGATIVE
Specific Gravity, Urine: <1.005 — ABNORMAL LOW (ref 1.005–1.030)
pH: 6 (ref 5.0–8.0)

## 2011-10-20 LAB — WET PREP, GENITAL
Clue Cells Wet Prep HPF POC: NONE SEEN
Trich, Wet Prep: NONE SEEN
Yeast Wet Prep HPF POC: NONE SEEN

## 2011-10-20 LAB — URINE MICROSCOPIC-ADD ON

## 2011-10-20 MED ORDER — ONDANSETRON 8 MG PO TBDP: 8.0000 mg | ORAL_TABLET | Freq: Once | ORAL | Status: DC

## 2011-10-20 MED ORDER — METRONIDAZOLE 500 MG PO TABS: 500.0000 mg | ORAL_TABLET | Freq: Two times a day (BID) | ORAL | Status: DC

## 2011-10-20 MED ORDER — DOXYCYCLINE HYCLATE 100 MG PO TABS: 100.0000 mg | ORAL_TABLET | Freq: Two times a day (BID) | ORAL | Status: DC

## 2011-10-20 NOTE — MAU Note (Signed)
Ongoing problem with bleeding.

## 2011-10-20 NOTE — Telephone Encounter (Signed)
Dr. Luisa Hart approved refill for pt.  Called refill to [facility]:  Hydrocodone 7.5/ 500 mg, # 30, 1-2 po Q4-6H prn pain.  NO FURTHER refills.  Pharmacist will call pt.

## 2011-10-20 NOTE — Telephone Encounter (Signed)
Patient called in wanting to know if prescription request was received, pharmacy advised her they did not fill due to non response. I advised the request was received however the doctor Magnus Ivan) has already left the office and we will need to ask the doctor covering urgent office today to fill the request. Patient gave permission for message to be left on phone in case she cannot pick up. Advised as soon as we approval has been received the call would be made to let her know. Patient agreed.

## 2011-10-20 NOTE — MAU Note (Signed)
Emergency appy on 05/16; wk later had gall bladder ( Dr Daphine Deutscher, Dr Anna Genre).  Got out last Tues, was feeling a little better, then started throwing up on Sun, started bleeding vag. Started having back pain. Pain on rt side into vagina, went to hospital on Sun, did a CT scan- thought was more of GYN problem, did an [place], - several ovarian cysts and on uterus, lots of irritation.  Was told to come here.

## 2011-10-20 NOTE — Discharge Instructions (Signed)
Abnormal Uterine Bleeding Abnormal uterine bleeding can have many causes. Some cases are simply treated, while others are more serious. There are several kinds of bleeding that is considered abnormal, including:  Bleeding between periods.   Bleeding after sexual intercourse.   Spotting anytime in the menstrual cycle.   Bleeding heavier or more than normal.   Bleeding after menopause.  CAUSES  There are many causes of abnormal uterine bleeding. It can be present in teenagers, pregnant women, women during their reproductive years, and women who have reached menopause. Your caregiver will look for the more common causes depending on your age, signs, symptoms and your particular circumstance. Most cases are not serious and can be treated. Even the more serious causes, like cancer of the female organs, can be treated adequately if found in the early stages. That is why all types of bleeding should be evaluated and treated as soon as possible. DIAGNOSIS  Diagnosing the cause may take several kinds of tests. Your caregiver may:  Take a complete history of the type of bleeding.   Perform a complete physical exam and Pap smear.   Take an ultrasound on the abdomen showing a picture of the female organs and the pelvis.   Inject dye into the uterus and Fallopian tubes and X-ray them (hysterosalpingogram).   Place fluid in the uterus and do an ultrasound (sonohysterogrqphy).   Take a CT scan to examine the female organs and pelvis.   Take an MRI to examine the female organs and pelvis. There is no X-ray involved with this procedure.   Look inside the uterus with a telescope that has a light at the end (hysteroscopy).   Scrap the inside of the uterus to get tissue to examine (Dilatation and Curettage, D&C).   Look into the pelvis with a telescope that has a light at the end (laparoscopy). This is done through a very small cut (incision) in the abdomen.  TREATMENT  Treatment will depend on the  cause of the abnormal bleeding. It can include:  Doing nothing to allow the problem to take care of itself over time.   Hormone treatment.   Birth control pills.   Treating the medical condition causing the problem.   Laparoscopy.   Major or minor surgery   Destroying the lining of the uterus with electrical currant, laser, freezing or heat (uterine ablation).  HOME CARE INSTRUCTIONS   Follow your caregiver's recommendation on how to treat your problem.   See your caregiver if you missed a menstrual period and think you may be pregnant.   If you are bleeding heavily, count the number of pads/tampons you use and how often you have to change them. Tell this to your caregiver.   Avoid sexual intercourse until the problem is controlled.  SEEK MEDICAL CARE IF:   You have any kind of abnormal bleeding mentioned above.   You feel dizzy at times.   You are 41 years old and have not had a menstrual period yet.  SEEK IMMEDIATE MEDICAL CARE IF:   You pass out.   You are changing pads/tampons every 15 to 30 minutes.   You have belly (abdominal) pain.   You have a temperature of 100 F (37.8 C) or higher.   You become sweaty or weak.   You are passing large blood clots from the vagina.   You start to feel sick to your stomach (nauseous) and throw up (vomit).  Document Released: 05/04/2005 Document Revised: 04/23/2011 Document Reviewed: 09/27/2008 ExitCare   Patient Information 2012 [place], [place].Endometritis Endometritis is an irritation, soreness, and swelling (inflammation) of the lining of the uterus (endometrium).  CAUSES   Bacterial infections.   Sexually transmitted infections (STIs).   Having a miscarriage or childbirth, especially after a long labor or cesarean delivery.   Certain gynecological procedures (D&C, hysteroscopy, or contraceptive insertion).  SYMPTOMS   Fever.   Lower abdominal or pelvic pain.   Abnormal vaginal discharge or bleeding.    Abdominal bloating (distention) or swelling.   General discomfort or ill feeling.   Discomfort with bowel movements.  DIAGNOSIS  A physical and pelvic exam are performed. Other tests may include:  Cultures from the cervix.   Blood tests.   Examining a tissue sample of the uterine lining (endometrial biopsy).   Examining discharge under a microscope (wet prep).   Laparoscopy.  TREATMENT  Antibiotic medicines are usually given. Other treatments may include:  Fluids through the vein (IV fluids).   Rest.  HOME CARE INSTRUCTIONS   Take over-the-counter or prescription medicines for pain, discomfort, or fever as directed by your caregiver.   Resume your normal diet and activities as directed or as tolerated.   Do not douche or have sexual intercourse until your caregiver says it is okay.   Take your antibiotics as directed. Finish them even if you start to feel better.   Do not have sexual intercourse until your partner has been treated if your endometritis is caused by an STI.  SEEK IMMEDIATE MEDICAL CARE IF:   You have swelling or increasing pain in the abdomen.   You have a fever.   You have bad smelling vaginal discharge, or you have an increased amount of discharge.   You have abnormal vaginal bleeding.   Your medicine is not helping with the pain.   You experience any problems that may be related to the medicine you are taking.   You have nausea and vomiting, or you cannot keep foods down.   You have pain with bowel movements.  MAKE SURE YOU:   Understand these instructions.   Will watch your condition.   Will get help right away if you are not doing well or get worse.  Document Released: 04/28/2001 Document Revised: 04/23/2011 Document Reviewed: 12/01/2010 [facility] Patient Information 2012 [place], [place].

## 2011-10-20 NOTE — ED Notes (Signed)
Pt stated she had and emergency at home. Practionor notified and OK to d/c home. Rx e-prescribed

## 2011-10-20 NOTE — [facility] Provider Note (Signed)
Chief Complaint:  Emesis, Abdominal Pain, Back Pain and Ovarian Cyst    First Provider Initiated Contact with Patient 10/20/11 1721      [name] is  41 y.o. [id number].  Patient's last menstrual period was 10/01/2011.[name]  Her pregnancy status is negative.  She presents complaining of Emesis, Abdominal Pain, Back Pain and Ovarian Cyst . Onset is described as ongoing and has been present for  2 years. Emergency appy on 05/16; wk later had gall bladder ( Dr Daphine Deutscher, Dr Anna Genre). Discharged home last Tues, was feeling a little better, then started throwing up on Sun, started bleeding vag. Started having back pain. Pain on rt side into vagina, went to hospital on Sun, did a CT scan- thought was more of GYN problem, did an [place], - several ovarian cysts and on uterus, lots of irritation. Was told to come here.   Obstetrical/Gynecological History: Pertinent Gynecological History: Menses: irregular occurring approximately every 14 days with spotting approximately 20 days per month Bleeding: dysfunctional uterine bleeding Contraception: IUD and Paragard  Past Medical History: Past Medical History  Diagnosis Date  . Hypertension   . Anxiety   . Panic attack   . TMJ disease   . Acid reflux     Past Surgical History: Past Surgical History  Procedure Date  . Eye surgery   . Dilitation and curettage   . Ankle surgery   . Laparoscopic appendectomy 10/05/2011    Procedure: APPENDECTOMY LAPAROSCOPIC;  Surgeon: Valarie Merino, MD;  Location: [facility] OR;  Service: General;  Laterality: N/A;  . Cholecystectomy 10/12/2011    Procedure: LAPAROSCOPIC CHOLECYSTECTOMY WITH INTRAOPERATIVE CHOLANGIOGRAM;  Surgeon: Wilmon Arms. Corliss Skains, MD;  Location: [facility] OR;  Service: General;  Laterality: N/A;  . Appendectomy     Family History: Family History  Problem Relation Age of Onset  . Hypertension Other     Social History: History  Substance Use Topics  . Smoking status: Current Everyday Smoker  . Smokeless tobacco:  Not on file  . Alcohol Use: No    Allergies:  Allergies  Allergen Reactions  . Morphine And Related Hives  . Other     All antidepressents.   REACTION: Delerium, panic attacks, etc.  . Paroxetine     REACTION: delerium and panic reaction    Prescriptions prior to admission  Medication Sig Dispense Refill  . ALPRAZolam (XANAX) 1 MG tablet Take 1 mg by mouth 3 (three) times daily as needed. For panic attacks      . lisinopril (PRINIVIL,ZESTRIL) 10 MG tablet Take 10 mg by mouth daily.       [name] oxyCODONE-acetaminophen (PERCOCET) 5-325 MG per tablet Take 1-2 tablets by mouth every 4 (four) hours as needed. For pain.      [name] amoxicillin-clavulanate (AUGMENTIN) 875-125 MG per tablet Take 1 tablet by mouth every 12 (twelve) hours.      . ondansetron (ZOFRAN ODT) 4 MG disintegrating tablet Take 1 tablet (4 mg total) by mouth every 8 (eight) hours as needed for nausea.  10 tablet  0  . oxyCODONE-acetaminophen (PERCOCET) 5-325 MG per tablet Take 1 tablet by mouth every 4 (four) hours as needed for pain. May take 2 tablets PO q 6 hours for severe pain - Do not take with Tylenol as this tablet already contains tylenol  15 tablet  0  . promethazine (PHENERGAN) 25 MG suppository Place 1 suppository (25 mg total) rectally every 6 (six) hours as needed for nausea.  12 each  0  .  promethazine (PHENERGAN) 25 MG tablet Take 1 tablet (25 mg total) by mouth every 6 (six) hours as needed for nausea.  12 tablet  0  . DISCONTD: amoxicillin-clavulanate (AUGMENTIN) 875-125 MG per tablet Take 1 tablet by mouth every 12 (twelve) hours.  14 tablet  0    Review of Systems - History obtained from chart review and the patient Respiratory ROS: no cough, shortness of breath, or wheezing Cardiovascular ROS: no chest pain or dyspnea on exertion Gastrointestinal ROS: positive for - abdominal pain and nausea/vomiting negative for - constipation, diarrhea or gas/bloating Genito-Urinary ROS: positive for - change in menstrual  cycle, dyspareunia, irregular/heavy menses and urinary frequency/urgency negative for - genital discharge Neurological ROS: no TIA or stroke symptoms negative for - headaches  Physical Exam   Blood pressure 126/81, pulse 84, temperature 98.9 F (37.2 C), temperature source Oral, resp. rate 20, height 5\' 5"  (1.651 m), weight 273 lb (123.832 kg), last menstrual period 10/01/2011, SpO2 98.00%.  General: General appearance - alert, well appearing, and in no distress, oriented to person, place, and time, overweight and comfortable appearing Mental status - alert, oriented to person, place, and time, normal mood, behavior, speech, dress, motor activity, and thought processes, affect appropriate to mood Abdomen - soft, nontender, nondistended, no masses or organomegaly Focused Gynecological Exam: VULVA: normal appearing vulva with no masses, tenderness or lesions, VAGINA: vaginal discharge - mucoid, CERVIX: normal appearing cervix without discharge or lesions, UTERUS: non tender, unable to palpate secondary to habitus, ADNEXA: tenderness right, no masses  Labs: Recent Results (from the past 24 hour(s))  WET PREP, GENITAL   Collection Time   10/20/11  5:23 PM      Component Value Range   Yeast Wet Prep HPF POC NONE SEEN  NONE SEEN    Trich, Wet Prep NONE SEEN  NONE SEEN    Clue Cells Wet Prep HPF POC NONE SEEN  NONE SEEN    WBC, Wet Prep HPF POC FEW (*) NONE SEEN    Imaging Studies:  Dg Chest 2 View  10/18/2011  *RADIOLOGY REPORT*  Clinical Data: Fever.  Chest pain.  CHEST - 2 VIEW  Comparison: PA and lateral chest 07/07/2010.  Findings: Lungs are clear.  Heart size is normal.  No pneumothorax or pleural effusion.  IMPRESSION: Negative chest.  Original Report Authenticated By: Bernadene Bell. D'ALESSIO, M.D.   Dg Cholangiogram Operative  10/12/2011  *RADIOLOGY REPORT*  Clinical Data:   Cholelithiasis and cholecystitis.  INTRAOPERATIVE CHOLANGIOGRAM  Technique:  Cholangiographic images from the C-arm  fluoroscopic device were submitted for interpretation post-operatively.  Please see the procedural report for the amount of contrast and the fluoroscopy time utilized.  Comparison:  Ultrasound dated 10/12/2011  Findings:  There is free flow of contrast through the widely patent common bile duct into the duodenum.  The visualized intrahepatic ducts are also normal.  IMPRESSION: Normal intraoperative cholangiogram.  Original Report Authenticated By: Gwynn Burly, M.D.   US Abdomen Complete  10/12/2011  *RADIOLOGY REPORT*  Clinical Data:  6 days status post appendectomy, now with abdominal pain.  COMPLETE ABDOMINAL ULTRASOUND  Comparison:  None.  Findings:  Gallbladder:  Possible gallstone impacted in the neck of the gallbladder, 8-9 mm in size.  There is moderate gallbladder wall thickening of 4-5 mm.  No biliary ductal dilatation.   Diffuse abdominal tenderness without focal sonographic tenderness in the right upper quadrant.  Common bile duct:  5 mm.  Liver:  No focal lesion identified. Diffuse increased echogenicity  suggesting steatosis.  IVC:  Appears normal.  Pancreas:  None visualized due to bowel gas.  Spleen:  Normal measuring 7.9 cm length appear  Right Kidney: Normal measuring 11.3 cm in length.  No focal lesions or hydronephrosis.  Left Kidney:  Normal measuring 12.0 cm in length.  No focal lesions or hydronephrosis.  Abdominal aorta:  No aneurysm identified.  IMPRESSION: Suspected 9 mm gallstone impacted in the neck of the gallbladder with thickened gallbladder wall; acute cholecystitis not excluded. There was no definite gallstone  or gallbladder wall thickening noted on prior  CT.  Original Report Authenticated By: Elsie Stain, M.D.   US Transvaginal Non-ob  10/18/2011  *RADIOLOGY REPORT*  Clinical Data: Follow-up CT scan.  TRANSABDOMINAL AND TRANSVAGINAL ULTRASOUND OF PELVIS Technique:  Both transabdominal and transvaginal ultrasound examinations of the pelvis were performed. Transabdominal  technique was performed for global imaging of the pelvis including uterus, ovaries, adnexal regions, and pelvic cul-de-sac.  Comparison: CT scan, same date.   It was necessary to proceed with endovaginal exam following the transabdominal exam to visualize the endometrium and ovaries.  Findings:  Uterus: Measures 11.1 x 5.4 x 6.5 cm.  IUD is noted in the endometrial canal and appears to be normally located.  Two small cystic appearing areas in the myometrium correlate with the CT findings and could be secondary to endometritis or prominent endometrial glands.  Endometrium: Normal in thickness.  A small amount of fluid noted in the endocervical canal.  Right ovary:  Measures 3.6 x 3.2 x 3.8 cm.  Small cysts are noted.  Left ovary: Measures 2.1 x 1.8 x 1.8 cm.  No cysts or masses.  Other findings: No free fluid  IMPRESSION:  1.  Cystic areas in the myometrium correlate with the CT findings and could be due to endometritis or complex endometrial glands. 2.  1.9 x 1.7 x 1.4 cyst associated with the right ovary. 3.  Small amount of fluid in the endocervical canal.  Original Report Authenticated By: P. Loralie Champagne, M.D.   US Pelvis Complete  10/18/2011  *RADIOLOGY REPORT*  Clinical Data: Follow-up CT scan.  TRANSABDOMINAL AND TRANSVAGINAL ULTRASOUND OF PELVIS Technique:  Both transabdominal and transvaginal ultrasound examinations of the pelvis were performed. Transabdominal technique was performed for global imaging of the pelvis including uterus, ovaries, adnexal regions, and pelvic cul-de-sac.  Comparison: CT scan, same date.   It was necessary to proceed with endovaginal exam following the transabdominal exam to visualize the endometrium and ovaries.  Findings:  Uterus: Measures 11.1 x 5.4 x 6.5 cm.  IUD is noted in the endometrial canal and appears to be normally located.  Two small cystic appearing areas in the myometrium correlate with the CT findings and could be secondary to endometritis or prominent  endometrial glands.  Endometrium: Normal in thickness.  A small amount of fluid noted in the endocervical canal.  Right ovary:  Measures 3.6 x 3.2 x 3.8 cm.  Small cysts are noted.  Left ovary: Measures 2.1 x 1.8 x 1.8 cm.  No cysts or masses.  Other findings: No free fluid  IMPRESSION:  1.  Cystic areas in the myometrium correlate with the CT findings and could be due to endometritis or complex endometrial glands. 2.  1.9 x 1.7 x 1.4 cyst associated with the right ovary. 3.  Small amount of fluid in the endocervical canal.  Original Report Authenticated By: P. Loralie Champagne, M.D.   Ct Abdomen Pelvis W Contrast  10/18/2011  *RADIOLOGY REPORT*  Clinical Data: Status post appendectomy 13 days ago. Status post cholecystectomy 6 days ago.  Nausea and vomiting.  CT ABDOMEN AND PELVIS WITH CONTRAST  Technique:  Multidetector CT imaging of the abdomen and pelvis was performed following the standard protocol during bolus administration of intravenous contrast.  Contrast: OMNIPAQUE IOHEXOL 300 MG/ML  SOLN  Comparison: CT abdomen and pelvis 10/05/2011.  Findings: Low lung bases are clear.  No pleural or pericardial effusion.  Cholecystectomy clips are noted.  There is no fluid in the gallbladder fossa.  The biliary tree is unremarkable.  The liver, spleen, adrenal glands, pancreas and kidneys appear normal.  Postoperative change of appendectomy is noted.  No abscess or other complicating feature is identified.  The stomach and small and large bowel appear normal.  There is new soft tissue fullness in the right adnexa.  Also seen are two new cystic lesions within the myometrium in the fundus of the uterus.  One of these has a septation. On the prior study, the patient's inflamed appendix was nearly contiguous with the right ovary.  Left adnexa is unremarkable.  No lymphadenopathy is identified.  IMPRESSION:  1.  New soft tissue fullness in the right adnexa with two new cystic lesions in the myometrium of the fundus of  the uterus may be due to infectious or inflammatory process related to appendicitis. Recommend correlation with physical examination.  Pelvic ultrasound could also be used for further evaluation. 2.  Status post cholecystectomy without evidence of complication.  Original Report Authenticated By: Bernadene Bell. Maricela Curet, M.D.   Ct Abdomen Pelvis W Contrast  10/05/2011  *RADIOLOGY REPORT*  Clinical Data: Abdominal pain, distension.  CT ABDOMEN AND PELVIS WITH CONTRAST  Technique:  Multidetector CT imaging of the abdomen and pelvis was performed following the standard protocol during bolus administration of intravenous contrast.  Contrast: OMNIPAQUE IOHEXOL 300 MG/ML  SOLN  Comparison: None.  Findings: Limited images through the lung bases demonstrate no significant appreciable abnormality. The heart size is within normal limits. No pleural or pericardial effusion.  Hepatic steatosis.  No focal abnormality.  Unremarkable biliary system, spleen, pancreas, adrenal glands.  Symmetric renal enhancement.  No hydronephrosis or hydroureter.  Acute appendicitis with the appendix measuring up to 9 mm in diameter.  There is mild periappendiceal fat stranding.  No bowel obstruction.  No CT evidence for colitis.  No free intraperitoneal air or loculated fluid.  Decompressed bladder.  IUD within the uterus.  No adnexal mass.  Normal caliber vasculature.  No lymphadenopathy.  Multilevel degenerative changes of the imaged spine. Mild wedge deformity of L2 and T11.  No definite acute or aggressive appearing osseous lesion.  IMPRESSION: Acute appendicitis.  Original Report Authenticated By: Waneta Martins, M.D.     Assessment: ? Endometritis DUB Pelvic Pain  Plan: Discharge home Rx Doxy and flagyl sent to pharmacy since pt states she cannot afford Augmentin. FU in [facility] staff will call with date/time of appt. Discussed with patient removing Copper IUD and replacing with Mirena for  DUB  Allura Doepke E. 10/20/2011,6:18 PM

## 2011-10-27 ENCOUNTER — Encounter (HOSPITAL_COMMUNITY): Payer: Self-pay | Admitting: *Deleted

## 2011-10-27 ENCOUNTER — Emergency Department (HOSPITAL_COMMUNITY)
Admission: EM | Admit: 2011-10-27 | Discharge: 2011-10-27 | Disposition: A | Discharge status: Home / Self Care | Payer: Medicaid Other | Attending: Emergency Medicine | Admitting: Emergency Medicine

## 2011-10-27 DIAGNOSIS — Y836 Removal of other organ (partial) (total) as the cause of abnormal reaction of the patient, or of later complication, without mention of misadventure at the time of the procedure: Secondary | ICD-10-CM | POA: Insufficient documentation

## 2011-10-27 DIAGNOSIS — F17200 Nicotine dependence, unspecified, uncomplicated: Secondary | ICD-10-CM | POA: Insufficient documentation

## 2011-10-27 DIAGNOSIS — T8131XA Disruption of external operation (surgical) wound, not elsewhere classified, initial encounter: Secondary | ICD-10-CM | POA: Insufficient documentation

## 2011-10-27 DIAGNOSIS — K219 Gastro-esophageal reflux disease without esophagitis: Secondary | ICD-10-CM | POA: Insufficient documentation

## 2011-10-27 DIAGNOSIS — I10 Essential (primary) hypertension: Secondary | ICD-10-CM | POA: Insufficient documentation

## 2011-10-27 DIAGNOSIS — T819XXA Unspecified complication of procedure, initial encounter: Secondary | ICD-10-CM

## 2011-10-27 DIAGNOSIS — F411 Generalized anxiety disorder: Secondary | ICD-10-CM | POA: Insufficient documentation

## 2011-10-27 MED ORDER — OXYCODONE-ACETAMINOPHEN 5-325 MG PO TABS: 1.0000 | ORAL_TABLET | Freq: Four times a day (QID) | ORAL | Status: DC | PRN

## 2011-10-27 MED ORDER — CEPHALEXIN 500 MG PO CAPS: 500.0000 mg | ORAL_CAPSULE | Freq: Four times a day (QID) | ORAL | Status: AC

## 2011-10-27 NOTE — ED Notes (Signed)
Incision to the rt abd the pt thinks it is getting infection.  She had desolvable sutures working its way out

## 2011-10-27 NOTE — Discharge Instructions (Signed)
You were seen and evaluated for your concerns of postoperative complication with bleeding and possible infection. At this time your providers feel that there is no signs for a significant infection from your wound. You have been given an antibiotic to help prevent any possible infection. He should keep your wound clean with soap and water and pat dry. You may also use a topical antibiotic ointment such as Neosporin. Please continue to followup with your general surgeon for continued evaluation and treatment. Return if you have any increasing redness of the skin, swelling, pain, fever, chills or sweats.    Wound Dehiscence Wound dehiscence is when a cut (incision) breaks open and does not heal properly after surgery. It usually happens 7 to 10 days after surgery. When a wound dehiscence is noticed and treated early, it is not always dangerous.  CAUSES   Stretching of the wound area caused by lifting, vomiting, violent coughing, or straining due to constipation.   Wound infection.   Early stitch (suture) removal.   Poor nutrition prior to surgery.  Other risk factors include:  Obesity.   Lung disease.   Smoking.   Poor nutrition.   Contamination during surgery.  SYMPTOMS  Bleeding from the wound.   Pain.   Fever.   Wound starts breaking open.  DIAGNOSIS  Your caregiver may diagnose wound dehiscence by monitoring the incision and noting wound changes. These changes can include an increase in drainage, pain, or complaints of stretching or tearing of the wound.   Wound cultures may be taken to determine if there is an infection.   Imaging studies, such as an MRI scan or CT scan, may be done to determine if there is a collection of pus or fluid.  TREATMENT Treatment may include:  Wound care.   Surgical repair.   Antibiotic medicine.   Medicines to reduce pain and swelling.  HOME CARE INSTRUCTIONS   Only take over-the-counter or prescription medicines for pain,  discomfort, or fever as directed by your caregiver. Taking pain medicine 30 minutes before changing a bandage (dressing) can help relieve pain.   Take your antibiotics as directed. Finish them even if you start to feel better.   Gently wash the area with mild soap and water 2 times a day, or as directed. Rinse off the soap. Pat the area dry with a clean towel. Do not rub the wound. This may cause bleeding.   Follow your caregiver's instructions for how often you need to change the dressing and packing inside. Wash your hands well before and after changing your dressing. Apply a dressing to the wound as directed.   Take showers. Do not take tub baths, swim, or do anything that may soak the wound until it is healed.   Avoid exercises that make you sweat heavily.   Use anti-itch medicine as directed by your caregiver. The wound may itch when it is healing. Do not pick or scratch at the wound.   Do not lift more than 10 pounds (4.5 kg) until the wound is healed, or as directed by your caregiver.   Keep all follow-up appointments as directed.  SEEK IMMEDIATE MEDICAL CARE IF:   You have increased swelling or redness around the wound.   You have increasing pain in the wound.   You have an increasing amount of pus coming from the wound.   More of the wound breaks open.   You have a fever.  MAKE SURE YOU:   Understand these instructions.  Will watch your condition.   Will get help right away if you are not doing well or get worse.  Document Released: 07/25/2003 Document Revised: 04/23/2011 Document Reviewed: 09/07/2010 [facility] Patient Information 2012 [place], [place].       Wound Care Wound care helps prevent pain and infection.  You may need a tetanus shot if:  You cannot remember when you had your last tetanus shot.   You have never had a tetanus shot.   The injury broke your skin.  If you need a tetanus shot and you choose not to have one, you may get tetanus. Sickness  from tetanus can be serious. HOME CARE   Only take medicine as told by your doctor.   Clean the wound daily with mild soap and water.   Change any bandages (dressings) as told by your doctor.   Put medicated cream and a bandage on the wound as told by your doctor.   Change the bandage if it gets wet, dirty, or starts to smell.   Take showers. Do not take baths, swim, or do anything that puts your wound under water.   Rest and raise (elevate) the wound until the pain and puffiness (swelling) are better.   Keep all doctor visits as told.  GET HELP RIGHT AWAY IF:   Yellowish-white fluid (pus) comes from the wound.   Medicine does not lessen your pain.   There is a red streak going away from the wound.   You cannot move your finger or toe.   You have a fever.  MAKE SURE YOU:   Understand these instructions.   Will watch your condition.   Will get help right away if you are not doing well or get worse.  Document Released: 02/11/2008 Document Revised: 04/23/2011 Document Reviewed: 09/07/2010 [facility] Patient Information 2012 [place], [place].

## 2011-10-27 NOTE — ED Provider Notes (Signed)
History     CSN: [id number]  Arrival date & time 10/27/11  2130   First MD Initiated Contact with Patient 10/27/11 2205      Chief Complaint  Patient presents with  . rt incision infected     HPI  History provided by the patient. Patient is a 41 year old female with history of hypertension, anxiety, appendectomy and cholecystectomy presents with concerns for surgical wound in flexion. Patient reports having cholecystectomy 14 days ago by Dr. Gwinda Passe. Patient had a recent appendectomy just prior to this procedure. Yesterday patient noticed small separation of her right upper endoscopic incision with small amount of bleeding. Patient also reports having yellow pus draining from the area. Patient reports having slight soreness to the area. Symptoms are described as moderate. She denies any other aggravating or alleviating factors. Patient denies any other associated symptoms. She denies any fever, chills, sweats, nausea vomiting. She denies any diarrhea or constipation. She denies any cough or shortness of breath.      Past Medical History  Diagnosis Date  . Hypertension   . Anxiety   . Panic attack   . TMJ disease   . Acid reflux     Past Surgical History  Procedure Date  . Eye surgery   . Dilitation and curettage   . Ankle surgery   . Laparoscopic appendectomy 10/05/2011    Procedure: APPENDECTOMY LAPAROSCOPIC;  Surgeon: Valarie Merino, MD;  Location: [facility] OR;  Service: General;  Laterality: N/A;  . Cholecystectomy 10/12/2011    Procedure: LAPAROSCOPIC CHOLECYSTECTOMY WITH INTRAOPERATIVE CHOLANGIOGRAM;  Surgeon: Wilmon Arms. Corliss Skains, MD;  Location: [facility] OR;  Service: General;  Laterality: N/A;  . Appendectomy     Family History  Problem Relation Age of Onset  . Hypertension Other     History  Substance Use Topics  . Smoking status: Current Everyday Smoker  . Smokeless tobacco: Not on file  . Alcohol Use: No    OB History    Grav Para Term Preterm Abortions TAB SAB Ect Mult  Living   5 3 3  2  2   3       Review of Systems  Constitutional: Negative for fever and chills.  Gastrointestinal: Positive for abdominal pain. Negative for nausea and vomiting.  Skin:       Bleeding and draining from incision    Allergies  Morphine and related; Other; and Paroxetine  Home Medications   Current Outpatient Rx  Name Route Sig Dispense Refill  . ALPRAZOLAM 1 MG PO TABS Oral Take 1 mg by mouth 3 (three) times daily as needed. For anxiety    . AMOXICILLIN-POT CLAVULANATE 875-125 MG PO TABS Oral Take 1 tablet by mouth every 12 (twelve) hours.    [name] DOXYCYCLINE HYCLATE 100 MG PO TABS Oral Take 1 tablet (100 mg total) by mouth 2 (two) times daily. 20 tablet 0  . HYDROCODONE-ACETAMINOPHEN 7.5-500 MG PO TABS Oral Take 1-2 tablets by mouth every 4 (four) hours as needed. For pain    . LISINOPRIL 10 MG PO TABS Oral Take 10 mg by mouth daily.     [name] METRONIDAZOLE 500 MG PO TABS Oral Take 1 tablet (500 mg total) by mouth 2 (two) times daily. 20 tablet 0  . OXYCODONE-ACETAMINOPHEN 5-325 MG PO TABS Oral Take 2 tablets by mouth every 6 (six) hours as needed. For pain    . ONDANSETRON 4 MG PO TBDP Oral Take 1 tablet (4 mg total) by mouth every 8 (eight) hours as needed  for nausea. 10 tablet 0  . PROMETHAZINE HCL 25 MG RE SUPP Rectal Place 1 suppository (25 mg total) rectally every 6 (six) hours as needed for nausea. 12 each 0  . PROMETHAZINE HCL 25 MG PO TABS Oral Take 1 tablet (25 mg total) by mouth every 6 (six) hours as needed for nausea. 12 tablet 0    BP 148/96  Pulse 106  Temp(Src) 98.6 F (37 C) (Oral)  Resp 19  SpO2 100%  LMP 10/01/2011  Physical Exam  Nursing note and vitals reviewed. Constitutional: She is oriented to person, place, and time. She appears well-developed and well-nourished. No distress.  HENT:  Head: Normocephalic and atraumatic.  Cardiovascular: Normal rate and regular rhythm.   Pulmonary/Chest: Effort normal and breath sounds normal. She has no  wheezes. She has no rales.  Abdominal: Soft. She exhibits no distension. There is no tenderness. There is no rebound and no guarding.       1-2 cm laparoscopic incision and right upper quadrant with slight dehiscence. No significant bleeding or drainage. Subcutaneous tissue in lower skin appears to be a healed and attached. No significant erythema or induration of the skin.  Neurological: She is alert and oriented to person, place, and time.  Skin: Skin is warm and dry. No rash noted.  Psychiatric: She has a normal mood and affect. Her behavior is normal.    ED Course  Procedures   1. Postoperative or surgical complication       MDM  Patient seen and evaluated. Patient no acute distress.      Angus Seller, [place] 10/29/11 [phone]

## 2011-10-29 ENCOUNTER — Emergency Department (HOSPITAL_COMMUNITY)
Admission: EM | Admit: 2011-10-29 | Discharge: 2011-10-30 | Disposition: A | Discharge status: Home / Self Care | Payer: Medicaid Other | Attending: Emergency Medicine | Admitting: Emergency Medicine

## 2011-10-29 ENCOUNTER — Telehealth (INDEPENDENT_AMBULATORY_CARE_PROVIDER_SITE_OTHER): Payer: Self-pay | Admitting: General Surgery

## 2011-10-29 ENCOUNTER — Encounter (HOSPITAL_COMMUNITY): Payer: Self-pay

## 2011-10-29 DIAGNOSIS — F17200 Nicotine dependence, unspecified, uncomplicated: Secondary | ICD-10-CM | POA: Insufficient documentation

## 2011-10-29 DIAGNOSIS — F411 Generalized anxiety disorder: Secondary | ICD-10-CM | POA: Insufficient documentation

## 2011-10-29 DIAGNOSIS — R112 Nausea with vomiting, unspecified: Secondary | ICD-10-CM | POA: Insufficient documentation

## 2011-10-29 DIAGNOSIS — F410 Panic disorder [episodic paroxysmal anxiety] without agoraphobia: Secondary | ICD-10-CM | POA: Insufficient documentation

## 2011-10-29 DIAGNOSIS — R197 Diarrhea, unspecified: Secondary | ICD-10-CM

## 2011-10-29 DIAGNOSIS — T8130XA Disruption of wound, unspecified, initial encounter: Secondary | ICD-10-CM | POA: Insufficient documentation

## 2011-10-29 DIAGNOSIS — R109 Unspecified abdominal pain: Secondary | ICD-10-CM

## 2011-10-29 DIAGNOSIS — T8131XA Disruption of external operation (surgical) wound, not elsewhere classified, initial encounter: Secondary | ICD-10-CM

## 2011-10-29 DIAGNOSIS — Y838 Other surgical procedures as the cause of abnormal reaction of the patient, or of later complication, without mention of misadventure at the time of the procedure: Secondary | ICD-10-CM | POA: Insufficient documentation

## 2011-10-29 LAB — CBC
HCT: 32.3 % — ABNORMAL LOW (ref 36.0–46.0)
Hemoglobin: 10 g/dL — ABNORMAL LOW (ref 12.0–15.0)
MCH: 23.5 pg — ABNORMAL LOW (ref 26.0–34.0)
MCHC: 31 g/dL (ref 30.0–36.0)
MCV: 75.8 fL — ABNORMAL LOW (ref 78.0–100.0)

## 2011-10-29 LAB — URINALYSIS, ROUTINE W REFLEX MICROSCOPIC
Bilirubin Urine: NEGATIVE
Ketones, ur: NEGATIVE mg/dL
Nitrite: NEGATIVE
Urobilinogen, UA: 0.2 mg/dL (ref 0.0–1.0)

## 2011-10-29 LAB — POCT I-STAT, CHEM 8
Creatinine, Ser: 0.7 mg/dL (ref 0.50–1.10)
Hemoglobin: 10.9 g/dL — ABNORMAL LOW (ref 12.0–15.0)
Sodium: 141 mEq/L (ref 135–145)
TCO2: 20 mmol/L (ref 0–100)

## 2011-10-29 LAB — DIFFERENTIAL
Basophils Relative: 1 % (ref 0–1)
Eosinophils Absolute: 0.4 10*3/uL (ref 0.0–0.7)
Eosinophils Relative: 5 % (ref 0–5)
Monocytes Absolute: 0.7 10*3/uL (ref 0.1–1.0)
Monocytes Relative: 9 % (ref 3–12)

## 2011-10-29 MED ORDER — IOHEXOL 300 MG/ML  SOLN: 20.0000 mL | INTRAMUSCULAR | Status: AC

## 2011-10-29 MED ORDER — HYDROMORPHONE HCL PF 1 MG/ML IJ SOLN
1.0000 mg | Freq: Once | INTRAMUSCULAR | Status: AC
  Administered 2011-10-29: 1 mg via INTRAVENOUS
  Filled 2011-10-29: qty 1

## 2011-10-29 MED ORDER — SODIUM CHLORIDE 0.9 % IV BOLUS (SEPSIS)
1000.0000 mL | Freq: Once | INTRAVENOUS | Status: AC
  Administered 2011-10-29: 1000 mL via INTRAVENOUS

## 2011-10-29 MED ORDER — ONDANSETRON HCL 4 MG/2ML IJ SOLN
4.0000 mg | Freq: Once | INTRAMUSCULAR | Status: AC
  Administered 2011-10-29: 4 mg via INTRAVENOUS
  Filled 2011-10-29: qty 2

## 2011-10-29 NOTE — ED Notes (Signed)
Patient presents with groin pain with radiation to right flank. Patient also  appendix removed in May, and gallbladder removed 2 weeks ago. Patient's gall bladder incision split open 2 days ago; bleeding controlled but is currently 1 inch and open.  Dressing applied.

## 2011-10-29 NOTE — Telephone Encounter (Signed)
Incision has come open enough to begin bleeding today.  She has appt tomorrow with MT.  Recommended apply direct pressure and ice to control the bleeding, then use butterfly bandaid to approximate the skin again.  If bleeding is uncontrolled, consider going to ER for assistance.

## 2011-10-29 NOTE — ED Provider Notes (Signed)
History     CSN: [id number]  Arrival date & time 10/29/11  4098   First MD Initiated Contact with Patient 10/29/11 2218      Chief Complaint  Patient presents with  . Abdominal Pain    (Consider location/radiation/quality/duration/timing/severity/associated sxs/prior treatment) HPI  41 year old female with recent cholecystectomy performed 2 weeks ago, and an appendectomy performed last month presents complaining of abdominal pain. Patient states for the past 2 days she has had increasing pain which started from the umbilicus and radiates up to the right upper quadrant. Pain is described as sharp and throbbing and worsening with movement or palpation. She also has associated nausea, has vomiting several times with trace of blood in the vomitus, and she has had multiple bouts of nonbloody diarrhea. Yesterday she noticed one of her laparoscopic surgical wound was bleeding, with increasing pain. She was seen in the ED for and was prescribed Keflex and pain medication. Patient did take one dose of Keflex. While showering today physician surgical wound were bleeding  More, which concerns her.  She decided to come to ED for further evaluation. Patient reports subjective fever, and chills. She denies chest pain, shortness of breath, back pain, urinary symptoms. She does notice rash on her skin while she's been in the ED room.  Denies throat swelling, trouble breathing, pain or itch.  Past Medical History  Diagnosis Date  . Hypertension   . Anxiety   . Panic attack   . TMJ disease   . Acid reflux     Past Surgical History  Procedure Date  . Eye surgery   . Dilitation and curettage   . Ankle surgery   . Laparoscopic appendectomy 10/05/2011    Procedure: APPENDECTOMY LAPAROSCOPIC;  Surgeon: Valarie Merino, MD;  Location: [facility] OR;  Service: General;  Laterality: N/A;  . Cholecystectomy 10/12/2011    Procedure: LAPAROSCOPIC CHOLECYSTECTOMY WITH INTRAOPERATIVE CHOLANGIOGRAM;  Surgeon: Wilmon Arms.  Corliss Skains, MD;  Location: [facility] OR;  Service: General;  Laterality: N/A;  . Appendectomy     Family History  Problem Relation Age of Onset  . Hypertension Other     History  Substance Use Topics  . Smoking status: Current Everyday Smoker  . Smokeless tobacco: Not on file  . Alcohol Use: No    OB History    Grav Para Term Preterm Abortions TAB SAB Ect Mult Living   5 3 3  2  2   3       Review of Systems  All other systems reviewed and are negative.    Allergies  Morphine and related; Other; and Paroxetine  Home Medications   Current Outpatient Rx  Name Route Sig Dispense Refill  . ALPRAZOLAM 1 MG PO TABS Oral Take 1 mg by mouth 3 (three) times daily as needed. For anxiety    . AMOXICILLIN-POT CLAVULANATE 875-125 MG PO TABS Oral Take 1 tablet by mouth every 12 (twelve) hours.    . CEPHALEXIN 500 MG PO CAPS Oral Take 1 capsule (500 mg total) by mouth 4 (four) times daily. 28 capsule 0  . DOXYCYCLINE HYCLATE 100 MG PO TABS Oral Take 1 tablet (100 mg total) by mouth 2 (two) times daily. 20 tablet 0  . HYDROCODONE-ACETAMINOPHEN 7.5-500 MG PO TABS Oral Take 1-2 tablets by mouth every 4 (four) hours as needed. For pain    . LISINOPRIL 10 MG PO TABS Oral Take 10 mg by mouth daily.     [name] METRONIDAZOLE 500 MG PO TABS Oral Take 1  tablet (500 mg total) by mouth 2 (two) times daily. 20 tablet 0  . OXYCODONE-ACETAMINOPHEN 5-325 MG PO TABS Oral Take 2 tablets by mouth every 6 (six) hours as needed. For pain    . OXYCODONE-ACETAMINOPHEN 5-325 MG PO TABS Oral Take 1-2 tablets by mouth every 6 (six) hours as needed for pain. 10 tablet 0  . PROMETHAZINE HCL 25 MG RE SUPP Rectal Place 1 suppository (25 mg total) rectally every 6 (six) hours as needed for nausea. 12 each 0  . PROMETHAZINE HCL 25 MG PO TABS Oral Take 1 tablet (25 mg total) by mouth every 6 (six) hours as needed for nausea. 12 tablet 0    BP 132/87  Pulse 98  Temp 100.2 F (37.9 C) (Oral)  Resp 20  SpO2 97%  LMP  10/01/2011  Physical Exam  Nursing note and vitals reviewed. Constitutional: She is oriented to person, place, and time. She appears well-developed and well-nourished. No distress.       Morbidly obese  HENT:  Head: Normocephalic and atraumatic.  Mouth/Throat: Oropharynx is clear and moist. No oropharyngeal exudate.  Eyes: Conjunctivae are normal.  Neck: Normal range of motion. Neck supple.  Cardiovascular: Normal rate and regular rhythm.  Exam reveals no gallop and no friction rub.   No murmur heard. Pulmonary/Chest: Effort normal and breath sounds normal. No respiratory distress. She exhibits no tenderness.  Abdominal: Soft. There is tenderness. There is no rebound and no guarding.       Abdomen is soft, 2 well-healing arthroscopic surgical wounds. One 1 cm horizontal laparoscopic surgical wound on her right abdomen with surrounding erythema and a scabbed over. Tenderness on palpation throughout right abdomen without guarding or rebound tenderness.  Neurological: She is alert and oriented to person, place, and time.  Skin: Skin is warm.       Faint maculopapular skin rash noted on both forearms and on neck line.  Non petechial, non pustular.       ED Course  Procedures (including critical care time)  Labs Reviewed  URINALYSIS, ROUTINE W REFLEX MICROSCOPIC - Abnormal; Notable for the following:    Leukocytes, UA TRACE (*)     All other components within normal limits  URINE MICROSCOPIC-ADD ON   No results found.   No diagnosis found.  Results for orders placed during the hospital encounter of 10/29/11  URINALYSIS, ROUTINE W REFLEX MICROSCOPIC      Component Value Range   Color, Urine YELLOW  YELLOW   APPearance CLEAR  CLEAR   Specific Gravity, Urine 1.008  1.005 - 1.030   pH 6.0  5.0 - 8.0   Glucose, UA NEGATIVE  NEGATIVE mg/dL   Hgb urine dipstick NEGATIVE  NEGATIVE   Bilirubin Urine NEGATIVE  NEGATIVE   Ketones, ur NEGATIVE  NEGATIVE mg/dL   Protein, ur NEGATIVE   NEGATIVE mg/dL   Urobilinogen, UA 0.2  0.0 - 1.0 mg/dL   Nitrite NEGATIVE  NEGATIVE   Leukocytes, UA TRACE (*) NEGATIVE  URINE MICROSCOPIC-ADD ON      Component Value Range   Squamous Epithelial / LPF RARE  RARE   WBC, UA 0-2  <3 WBC/hpf   RBC / HPF 0-2  <3 RBC/hpf  CBC      Component Value Range   WBC 8.1  4.0 - 10.5 K/uL   RBC 4.26  3.87 - 5.11 MIL/uL   Hemoglobin 10.0 (*) 12.0 - 15.0 g/dL   HCT 95.6 (*) 21.3 - 08.6 %   MCV  75.8 (*) 78.0 - 100.0 fL   MCH 23.5 (*) 26.0 - 34.0 pg   MCHC 31.0  30.0 - 36.0 g/dL   RDW 45.4 (*) 09.8 - 11.9 %   Platelets 304  150 - 400 K/uL  DIFFERENTIAL      Component Value Range   Neutrophils Relative 53  43 - 77 %   Neutro Abs 4.3  1.7 - 7.7 K/uL   Lymphocytes Relative 33  12 - 46 %   Lymphs Abs 2.7  0.7 - 4.0 K/uL   Monocytes Relative 9  3 - 12 %   Monocytes Absolute 0.7  0.1 - 1.0 K/uL   Eosinophils Relative 5  0 - 5 %   Eosinophils Absolute 0.4  0.0 - 0.7 K/uL   Basophils Relative 1  0 - 1 %   Basophils Absolute 0.0  0.0 - 0.1 K/uL  POCT I-STAT, CHEM 8      Component Value Range   Sodium 141  135 - 145 mEq/L   Potassium 3.5  3.5 - 5.1 mEq/L   Chloride 111  96 - 112 mEq/L   BUN 8  6 - 23 mg/dL   Creatinine, Ser 1.47  0.50 - 1.10 mg/dL   Glucose, Bld 93  70 - 99 mg/dL   Calcium, Ion 8.29  5.62 - 1.32 mmol/L   TCO2 20  0 - 100 mmol/L   Hemoglobin 10.9 (*) 12.0 - 15.0 g/dL   HCT 13.0 (*) 86.5 - 78.4 %   Dg Chest 2 View  10/18/2011  *RADIOLOGY REPORT*  Clinical Data: Fever.  Chest pain.  CHEST - 2 VIEW  Comparison: PA and lateral chest 07/07/2010.  Findings: Lungs are clear.  Heart size is normal.  No pneumothorax or pleural effusion.  IMPRESSION: Negative chest.  Original Report Authenticated By: Bernadene Bell. D'ALESSIO, M.D.   Dg Cholangiogram Operative  10/12/2011  *RADIOLOGY REPORT*  Clinical Data:   Cholelithiasis and cholecystitis.  INTRAOPERATIVE CHOLANGIOGRAM  Technique:  Cholangiographic images from the C-arm fluoroscopic device were  submitted for interpretation post-operatively.  Please see the procedural report for the amount of contrast and the fluoroscopy time utilized.  Comparison:  Ultrasound dated 10/12/2011  Findings:  There is free flow of contrast through the widely patent common bile duct into the duodenum.  The visualized intrahepatic ducts are also normal.  IMPRESSION: Normal intraoperative cholangiogram.  Original Report Authenticated By: Gwynn Burly, M.D.   US Abdomen Complete  10/12/2011  *RADIOLOGY REPORT*  Clinical Data:  6 days status post appendectomy, now with abdominal pain.  COMPLETE ABDOMINAL ULTRASOUND  Comparison:  None.  Findings:  Gallbladder:  Possible gallstone impacted in the neck of the gallbladder, 8-9 mm in size.  There is moderate gallbladder wall thickening of 4-5 mm.  No biliary ductal dilatation.   Diffuse abdominal tenderness without focal sonographic tenderness in the right upper quadrant.  Common bile duct:  5 mm.  Liver:  No focal lesion identified. Diffuse increased echogenicity suggesting steatosis.  IVC:  Appears normal.  Pancreas:  None visualized due to bowel gas.  Spleen:  Normal measuring 7.9 cm length appear  Right Kidney: Normal measuring 11.3 cm in length.  No focal lesions or hydronephrosis.  Left Kidney:  Normal measuring 12.0 cm in length.  No focal lesions or hydronephrosis.  Abdominal aorta:  No aneurysm identified.  IMPRESSION: Suspected 9 mm gallstone impacted in the neck of the gallbladder with thickened gallbladder wall; acute cholecystitis not excluded. There was no definite  gallstone  or gallbladder wall thickening noted on prior  CT.  Original Report Authenticated By: Elsie Stain, M.D.   US Transvaginal Non-ob  10/18/2011  *RADIOLOGY REPORT*  Clinical Data: Follow-up CT scan.  TRANSABDOMINAL AND TRANSVAGINAL ULTRASOUND OF PELVIS Technique:  Both transabdominal and transvaginal ultrasound examinations of the pelvis were performed. Transabdominal technique was performed  for global imaging of the pelvis including uterus, ovaries, adnexal regions, and pelvic cul-de-sac.  Comparison: CT scan, same date.   It was necessary to proceed with endovaginal exam following the transabdominal exam to visualize the endometrium and ovaries.  Findings:  Uterus: Measures 11.1 x 5.4 x 6.5 cm.  IUD is noted in the endometrial canal and appears to be normally located.  Two small cystic appearing areas in the myometrium correlate with the CT findings and could be secondary to endometritis or prominent endometrial glands.  Endometrium: Normal in thickness.  A small amount of fluid noted in the endocervical canal.  Right ovary:  Measures 3.6 x 3.2 x 3.8 cm.  Small cysts are noted.  Left ovary: Measures 2.1 x 1.8 x 1.8 cm.  No cysts or masses.  Other findings: No free fluid  IMPRESSION:  1.  Cystic areas in the myometrium correlate with the CT findings and could be due to endometritis or complex endometrial glands. 2.  1.9 x 1.7 x 1.4 cyst associated with the right ovary. 3.  Small amount of fluid in the endocervical canal.  Original Report Authenticated By: P. Loralie Champagne, M.D.   US Pelvis Complete  10/18/2011  *RADIOLOGY REPORT*  Clinical Data: Follow-up CT scan.  TRANSABDOMINAL AND TRANSVAGINAL ULTRASOUND OF PELVIS Technique:  Both transabdominal and transvaginal ultrasound examinations of the pelvis were performed. Transabdominal technique was performed for global imaging of the pelvis including uterus, ovaries, adnexal regions, and pelvic cul-de-sac.  Comparison: CT scan, same date.   It was necessary to proceed with endovaginal exam following the transabdominal exam to visualize the endometrium and ovaries.  Findings:  Uterus: Measures 11.1 x 5.4 x 6.5 cm.  IUD is noted in the endometrial canal and appears to be normally located.  Two small cystic appearing areas in the myometrium correlate with the CT findings and could be secondary to endometritis or prominent endometrial glands.   Endometrium: Normal in thickness.  A small amount of fluid noted in the endocervical canal.  Right ovary:  Measures 3.6 x 3.2 x 3.8 cm.  Small cysts are noted.  Left ovary: Measures 2.1 x 1.8 x 1.8 cm.  No cysts or masses.  Other findings: No free fluid  IMPRESSION:  1.  Cystic areas in the myometrium correlate with the CT findings and could be due to endometritis or complex endometrial glands. 2.  1.9 x 1.7 x 1.4 cyst associated with the right ovary. 3.  Small amount of fluid in the endocervical canal.  Original Report Authenticated By: P. Loralie Champagne, M.D.   Ct Abdomen Pelvis W Contrast  10/18/2011  *RADIOLOGY REPORT*  Clinical Data: Status post appendectomy 13 days ago. Status post cholecystectomy 6 days ago.  Nausea and vomiting.  CT ABDOMEN AND PELVIS WITH CONTRAST  Technique:  Multidetector CT imaging of the abdomen and pelvis was performed following the standard protocol during bolus administration of intravenous contrast.  Contrast: OMNIPAQUE IOHEXOL 300 MG/ML  SOLN  Comparison: CT abdomen and pelvis 10/05/2011.  Findings: Low lung bases are clear.  No pleural or pericardial effusion.  Cholecystectomy clips are noted.  There is no fluid  in the gallbladder fossa.  The biliary tree is unremarkable.  The liver, spleen, adrenal glands, pancreas and kidneys appear normal.  Postoperative change of appendectomy is noted.  No abscess or other complicating feature is identified.  The stomach and small and large bowel appear normal.  There is new soft tissue fullness in the right adnexa.  Also seen are two new cystic lesions within the myometrium in the fundus of the uterus.  One of these has a septation. On the prior study, the patient's inflamed appendix was nearly contiguous with the right ovary.  Left adnexa is unremarkable.  No lymphadenopathy is identified.  IMPRESSION:  1.  New soft tissue fullness in the right adnexa with two new cystic lesions in the myometrium of the fundus of the uterus may be  due to infectious or inflammatory process related to appendicitis. Recommend correlation with physical examination.  Pelvic ultrasound could also be used for further evaluation. 2.  Status post cholecystectomy without evidence of complication.  Original Report Authenticated By: Bernadene Bell. Maricela Curet, M.D.   Ct Abdomen Pelvis W Contrast  10/05/2011  *RADIOLOGY REPORT*  Clinical Data: Abdominal pain, distension.  CT ABDOMEN AND PELVIS WITH CONTRAST  Technique:  Multidetector CT imaging of the abdomen and pelvis was performed following the standard protocol during bolus administration of intravenous contrast.  Contrast: OMNIPAQUE IOHEXOL 300 MG/ML  SOLN  Comparison: None.  Findings: Limited images through the lung bases demonstrate no significant appreciable abnormality. The heart size is within normal limits. No pleural or pericardial effusion.  Hepatic steatosis.  No focal abnormality.  Unremarkable biliary system, spleen, pancreas, adrenal glands.  Symmetric renal enhancement.  No hydronephrosis or hydroureter.  Acute appendicitis with the appendix measuring up to 9 mm in diameter.  There is mild periappendiceal fat stranding.  No bowel obstruction.  No CT evidence for colitis.  No free intraperitoneal air or loculated fluid.  Decompressed bladder.  IUD within the uterus.  No adnexal mass.  Normal caliber vasculature.  No lymphadenopathy.  Multilevel degenerative changes of the imaged spine. Mild wedge deformity of L2 and T11.  No definite acute or aggressive appearing osseous lesion.  IMPRESSION: Acute appendicitis.  Original Report Authenticated By: Waneta Martins, M.D.      MDM  Increased abdominal pain with nausea vomiting and diarrhea. Increasing pain one of these microscopic surgical site with mild evidence of cellulitis. Due to recent surgery, I will obtain an abdominal CT scan for evaluation  Patient has a mild skin rash. This may be secondary to her recent antibiotic use,  Keflex.   11:46 PM Initially plan to have a repeat abd CT for further evaluation.  However, upon reviewing her prior medical record, it appears pt has 2 recent abd/pelvic CT in the past month, most recent 10 days ago.  Therefore, no further imaging today.  Will check labs, and symptomatic control.     12:10 AM Lab results are unremarkable.  Mild anemia with Hbg 10.0.  However, pt are not symptomatic.  Normal WBC.  Her current pain is likely due to not having any more pain medication, and also due to mild wound dehiscence.  I discussed with my attending.  Plan to have pt f/u with Dr. Corliss Skains for further management.  Pt agrees with plan.  Fayrene Helper, PA-C 10/30/11 0013

## 2011-10-30 ENCOUNTER — Encounter (INDEPENDENT_AMBULATORY_CARE_PROVIDER_SITE_OTHER): Payer: Self-pay | Admitting: Surgery

## 2011-10-30 ENCOUNTER — Ambulatory Visit (INDEPENDENT_AMBULATORY_CARE_PROVIDER_SITE_OTHER): Payer: Self-pay | Admitting: Surgery

## 2011-10-30 VITALS — BP 122/88 | HR 88 | Temp 98.4°F | Resp 16 | Ht 67.0 in | Wt 279.8 lb

## 2011-10-30 DIAGNOSIS — K3580 Unspecified acute appendicitis: Secondary | ICD-10-CM

## 2011-10-30 DIAGNOSIS — K8010 Calculus of gallbladder with chronic cholecystitis without obstruction: Secondary | ICD-10-CM | POA: Insufficient documentation

## 2011-10-30 LAB — HEPATIC FUNCTION PANEL
AST: 14 U/L (ref 0–37)
Bilirubin, Direct: <0.1 mg/dL (ref 0.0–0.3)
Total Bilirubin: 0.1 mg/dL — ABNORMAL LOW (ref 0.3–1.2)

## 2011-10-30 MED ORDER — HYDROMORPHONE HCL PF 1 MG/ML IJ SOLN
1.0000 mg | Freq: Once | INTRAMUSCULAR | Status: AC
  Administered 2011-10-30: 1 mg via INTRAVENOUS
  Filled 2011-10-30: qty 1

## 2011-10-30 MED ORDER — FAMOTIDINE 20 MG PO TABS: 40.0000 mg | ORAL_TABLET | Freq: Two times a day (BID) | ORAL | Status: DC

## 2011-10-30 MED ORDER — METOCLOPRAMIDE HCL 10 MG PO TABS: 10.0000 mg | ORAL_TABLET | Freq: Four times a day (QID) | ORAL | Status: DC

## 2011-10-30 MED ORDER — OXYCODONE-ACETAMINOPHEN 5-325 MG PO TABS: 1.0000 | ORAL_TABLET | ORAL | Status: AC | PRN

## 2011-10-30 MED ORDER — HYDROCODONE-ACETAMINOPHEN 5-325 MG PO TABS: 2.0000 | ORAL_TABLET | Freq: Four times a day (QID) | ORAL | Status: AC | PRN

## 2011-10-30 NOTE — ED Provider Notes (Signed)
Medical screening examination/treatment/procedure(s) were performed by non-physician practitioner and as supervising physician I was immediately available for consultation/collaboration.  Juliet Rude. Rubin Payor, MD 10/30/11 [phone]

## 2011-10-30 NOTE — Progress Notes (Signed)
This is a 41 year old female who is status post 2 recent surgeries. On 10/05/11 she underwent a laparoscopic appendectomy by Dr. Daphine Deutscher. The pathology report confirmed acute appendicitis but no other abnormality. She was discharged home on postoperative day #1. She was readmitted on 10/12/11 with ongoing right-sided abdominal pain. She underwent an ultrasound which showed a stone lodged in the neck of her gallbladder with some gallbladder wall thickening. I met her briefly before performing a laparoscopic cholecystectomy with intraoperative cholangiogram on 10/12/11. There were some adhesions to the gallbladder. The pathology confirmed chronic calculus cholecystitis. The patient was discharged home on 5/28. However if she continues to have some right-sided pain which seems to be localized in 2 different areas. She has pain in her right lateral abdominal wall near her incisions. She also has some low right-sided pelvic pain. In reviewing her medical records it appears that she has had several visits to the emergency department in the last couple of weeks.  On 10/18/11, she was evaluated and was noted to have normal LFT's, normal WBC.  CT scan Clinical Data: Status post appendectomy 13 days ago. Status post  cholecystectomy 6 days ago. Nausea and vomiting.  CT ABDOMEN AND PELVIS WITH CONTRAST  Technique: Multidetector CT imaging of the abdomen and pelvis was  performed following the standard protocol during bolus  administration of intravenous contrast.  Contrast: OMNIPAQUE IOHEXOL 300 MG/ML SOLN  Comparison: CT abdomen and pelvis 10/05/2011.  Findings: Low lung bases are clear. No pleural or pericardial  effusion.  Cholecystectomy clips are noted. There is no fluid in the  gallbladder fossa. The biliary tree is unremarkable. The liver,  spleen, adrenal glands, pancreas and kidneys appear normal.  Postoperative change of appendectomy is noted. No abscess or other  complicating feature is identified.  The stomach and small and  large bowel appear normal.  There is new soft tissue fullness in the right adnexa. Also seen  are two new cystic lesions within the myometrium in the fundus of  the uterus. One of these has a septation. On the prior study, the  patient's inflamed appendix was nearly contiguous with the right  ovary. Left adnexa is unremarkable. No lymphadenopathy is  identified.  IMPRESSION:  1. New soft tissue fullness in the right adnexa with two new  cystic lesions in the myometrium of the fundus of the uterus may be  due to infectious or inflammatory process related to appendicitis.  Recommend correlation with physical examination. Pelvic ultrasound  could also be used for further evaluation.  2. Status post cholecystectomy without evidence of complication.  Original Report Authenticated By: Bernadene Bell. Maricela Curet, M.D.   These adnexal/uterine findings were confirmed on pelvic ultrasound.  She was seen at [facility] regarding the right adnexal abnormalities and the inflammation of the myometrium.  She was started on antibiotics with little improvement.  She has a GYN appointment on 11/25/11.  Yesterday, she returned to the ED for bloody drainage from one of her laparoscopic incisions.    Lab Results  Component Value Date   WBC 8.1 10/29/2011   HGB 10.9* 10/29/2011   HCT 32.0* 10/29/2011   MCV 75.8* 10/29/2011   PLT 304 10/29/2011   Lab Results  Component Value Date   CREATININE 0.70 10/29/2011   BUN 8 10/29/2011   NA 141 10/29/2011   K 3.5 10/29/2011   CL 111 10/29/2011   CO2 24 10/18/2011   Lab Results  Component Value Date   ALT 12 10/29/2011   AST  14 10/29/2011   ALKPHOS 100 10/29/2011   BILITOT 0.1* 10/29/2011    She complains of feeling bloated, with nausea, as well as right-sided abdominal pain and right pelvic pain.  Filed Vitals:   10/30/11 1519  BP: 122/88  Pulse: 88  Temp: 98.4 F (36.9 C)  Resp: 16    WDWN in NAD HEENT:  EOMI, sclera  anicteric Neck:  No masses, no thyromegaly Lungs:  CTA bilaterally; normal respiratory effort CV:  Regular rate and rhythm; no murmurs Abd:  +bowel sounds, soft, mildly tender on right side of abdomen; the right upper quadrant medial incision has opened up and apparently drained an old hematoma.  No drainage noted today.  Minimal erythema around the wound.  The rest of her incisions looks fine Pelvis - mildly tender to palpation in R pelvis Ext:  Well-perfused; no edema Skin:  Warm, dry; no sign of jaundice  Imp/ plan:  Right-sided abdominal pain - after lap appy and lap chole - wound hematoma.  Abdominal wall soreness - could be some intramuscular hematoma; should resolve over time.  Treat wound with neosporin and a band-aid.  She has a long history of GERD symptoms, but is not using any medications.  I encouraged her to use an over-the-counter H2 blocker such as Pepcid.  I also gave her a prescription for Reglan.  We will recheck her in 1-2 weeks.    I also encouraged her to keep her follow-up with GYN.  They had discussed possibly removing her IUD, which may help relieve the myometritis.  Wilmon Arms. Corliss Skains, MD, [facility]  10/30/2011 6:09 PM

## 2011-10-30 NOTE — ED Provider Notes (Signed)
Medical screening examination/treatment/procedure(s) were performed by non-physician practitioner and as supervising physician I was immediately available for consultation/collaboration.   Celene Kras, MD 10/30/11 [id number]

## 2011-10-30 NOTE — Discharge Instructions (Signed)
Please follow up with Dr. Corliss Skains for further evaluation of your wound.  Continue taking Keflex but if you notice rash, trouble breathing, or throat swelling then discontinue and return to ER.  Take your medication as prescribed.    Nausea and Vomiting Nausea is a sick feeling that often comes before throwing up (vomiting). Vomiting is a reflex where stomach contents come out of your mouth. Vomiting can cause severe loss of body fluids (dehydration). Children and elderly adults can become dehydrated quickly, especially if they also have diarrhea. Nausea and vomiting are symptoms of a condition or disease. It is important to find the cause of your symptoms. CAUSES   Direct irritation of the stomach lining. This irritation can result from increased acid production (gastroesophageal reflux disease), infection, food poisoning, taking certain medicines (such as nonsteroidal anti-inflammatory drugs), alcohol use, or tobacco use.   Signals from the brain.These signals could be caused by a headache, heat exposure, an inner ear disturbance, increased pressure in the brain from injury, infection, a tumor, or a concussion, pain, emotional stimulus, or metabolic problems.   An obstruction in the gastrointestinal tract (bowel obstruction).   Illnesses such as diabetes, hepatitis, gallbladder problems, appendicitis, kidney problems, cancer, sepsis, atypical symptoms of a heart attack, or eating disorders.   Medical treatments such as chemotherapy and radiation.   Receiving medicine that makes you sleep (general anesthetic) during surgery.  DIAGNOSIS Your caregiver may ask for tests to be done if the problems do not improve after a few days. Tests may also be done if symptoms are severe or if the reason for the nausea and vomiting is not clear. Tests may include:  Urine tests.   Blood tests.   Stool tests.   Cultures (to look for evidence of infection).   X-rays or other imaging studies.  Test results  can help your caregiver make decisions about treatment or the need for additional tests. TREATMENT You need to stay well hydrated. Drink frequently but in small amounts.You may wish to drink water, sports drinks, clear broth, or eat frozen ice pops or gelatin dessert to help stay hydrated.When you eat, eating slowly may help prevent nausea.There are also some antinausea medicines that may help prevent nausea. HOME CARE INSTRUCTIONS   Take all medicine as directed by your caregiver.   If you do not have an appetite, do not force yourself to eat. However, you must continue to drink fluids.   If you have an appetite, eat a normal diet unless your caregiver tells you differently.   Eat a variety of complex carbohydrates (rice, wheat, potatoes, bread), lean meats, yogurt, fruits, and vegetables.   Avoid high-fat foods because they are more difficult to digest.   Drink enough water and fluids to keep your urine clear or pale yellow.   If you are dehydrated, ask your caregiver for specific rehydration instructions. Signs of dehydration may include:   Severe thirst.   Dry lips and mouth.   Dizziness.   Dark urine.   Decreasing urine frequency and amount.   Confusion.   Rapid breathing or pulse.  SEEK IMMEDIATE MEDICAL CARE IF:   You have blood or brown flecks (like coffee grounds) in your vomit.   You have black or bloody stools.   You have a severe headache or stiff neck.   You are confused.   You have severe abdominal pain.   You have chest pain or trouble breathing.   You do not urinate at least once every 8 hours.  You develop cold or clammy skin.   You continue to vomit for longer than 24 to 48 hours.   You have a fever.  MAKE SURE YOU:   Understand these instructions.   Will watch your condition.   Will get help right away if you are not doing well or get worse.  Document Released: 05/04/2005 Document Revised: 04/23/2011 Document Reviewed:  10/01/2010 [facility] Patient Information 2012 [place], [place].  Wound Dehiscence Wound dehiscence is when a surgical cut (incision) opens up. It usually happens 7 to 10 days after surgery. You may have pain, a fever, or have more fluid coming from the cut. It should be treated early. HOME CARE  Only take medicines as told by your doctor.   Take your medicines (antibiotics) as told. Finish them even if you start to feel better.   Wash your wound with warm, soapy water 2 times a day, or as told. Pat the wound dry. Do not rub the wound.   Change bandages (dressings) as often as told. Wash your hands before and after changing bandages. Apply bandages as told.   Take showers. Do not soak the wound, bathe, or swim until your wound is healed.   Avoid exercises that make you sweat.   Use medicines that stop itching as told by your doctor. The wound may itch as it heals. Do not pick or scratch at the wound.   Do not lift more than 10 pounds (4.5 kilograms) until the wound is healed, or as told by your doctor.   Keep all doctor visits as told.  GET HELP RIGHT AWAY IF:   You have more puffiness (swelling) or redness around the wound.   You have more pain in the wound.   You have yellowish white fluid (pus) coming from the wound.   More of the wound breaks open.   You have a fever.  MAKE SURE YOU:   Understand these instructions.   Will watch your condition.   Will get help right away if you are not doing well or get worse.  Document Released: 04/22/2009 Document Revised: 04/23/2011 Document Reviewed: 09/07/2010 [facility] Patient Information 2012 [place], [place].

## 2011-10-30 NOTE — Patient Instructions (Signed)
Neosporin/ band-aid to the surgical incision

## 2011-10-30 NOTE — ED Notes (Signed)
Patient attempting to get a ride home.  Has called husband, son and best friend

## 2011-11-02 ENCOUNTER — Telehealth (INDEPENDENT_AMBULATORY_CARE_PROVIDER_SITE_OTHER): Payer: Self-pay | Admitting: General Surgery

## 2011-11-02 ENCOUNTER — Telehealth: Payer: Self-pay | Admitting: General Practice

## 2011-11-02 NOTE — Telephone Encounter (Signed)
Patient called stating she has an appt on 6/21 and she is "having severe pain and lots of issues currently going on. I cannot have a bowel movement without a lot of pain before and after. I'm so bloated to the point where I cannot put my clothes on and I'm very very sore in my lower region. I also feel nauseous." Patient states she was wondering if something could be called in to make her more comfortable until her appt.

## 2011-11-02 NOTE — Telephone Encounter (Signed)
MS Mulcahey CALLED TO REQUEST EITHER A REFILL OF PERCOCET OR ANOTHER MEDICATION CALLED TO [facility]/[address]/ [phone]/ SHE SAW DR. Corliss Skains ON Friday/ I REVIEWED REQUEST WITH DR. Corliss Skains AND HE SAID I COULD CALL IN HYDROCODONE PROTOCOL/ 5/325/ CALLED TO [facility]/ PT NOTIFIED/GY

## 2011-11-04 NOTE — Telephone Encounter (Signed)
I called pt and left message that in light of her recent surgeries, she should receive advice for her pain from the doctor or practice that performed her surgery. Also, the pain meds that she is taking will often cause constipation and it would be best to obtain recommendation from that doctor. We will see her at next appt on 11/06/11.  *Note: pt received Rx of Vicodin from Dr. Fatima Sanger office on 11/02/11.

## 2011-11-06 ENCOUNTER — Ambulatory Visit (INDEPENDENT_AMBULATORY_CARE_PROVIDER_SITE_OTHER): Payer: Self-pay | Admitting: Obstetrics & Gynecology

## 2011-11-06 ENCOUNTER — Other Ambulatory Visit (HOSPITAL_COMMUNITY)
Admission: RE | Admit: 2011-11-06 | Discharge: 2011-11-06 | Disposition: A | Discharge status: Home / Self Care | Payer: Medicaid Other | Source: Ambulatory Visit | Attending: Obstetrics & Gynecology | Admitting: Obstetrics & Gynecology

## 2011-11-06 ENCOUNTER — Encounter: Payer: Self-pay | Admitting: Obstetrics & Gynecology

## 2011-11-06 VITALS — BP 135/85 | HR 82 | Temp 99.9°F | Ht 67.0 in | Wt 272.8 lb

## 2011-11-06 DIAGNOSIS — N719 Inflammatory disease of uterus, unspecified: Secondary | ICD-10-CM

## 2011-11-06 DIAGNOSIS — N920 Excessive and frequent menstruation with regular cycle: Secondary | ICD-10-CM | POA: Insufficient documentation

## 2011-11-06 DIAGNOSIS — G8929 Other chronic pain: Secondary | ICD-10-CM

## 2011-11-06 DIAGNOSIS — G4701 Insomnia due to medical condition: Secondary | ICD-10-CM

## 2011-11-06 DIAGNOSIS — Z01818 Encounter for other preprocedural examination: Secondary | ICD-10-CM

## 2011-11-06 LAB — CBC
MCH: 23.7 pg — ABNORMAL LOW (ref 26.0–34.0)
MCHC: 31.9 g/dL (ref 30.0–36.0)
Platelets: 379 10*3/uL (ref 150–400)
RBC: 4.72 MIL/uL (ref 3.87–5.11)

## 2011-11-06 LAB — FOLLICLE STIMULATING HORMONE: FSH: 11.4 m[IU]/mL

## 2011-11-06 MED ORDER — ZOLPIDEM TARTRATE 5 MG PO TABS: 5.0000 mg | ORAL_TABLET | Freq: Every evening | ORAL | Status: DC | PRN

## 2011-11-06 MED ORDER — HYDROCODONE-IBUPROFEN 7.5-200 MG PO TABS: 1.0000 | ORAL_TABLET | Freq: Four times a day (QID) | ORAL | Status: AC | PRN

## 2011-11-06 MED ORDER — MEGESTROL ACETATE 20 MG PO TABS: ORAL_TABLET | ORAL | Status: DC

## 2011-11-06 NOTE — Patient Instructions (Addendum)
Endometrial Ablation Endometrial ablation removes the lining of the uterus (endometrium). It is usually a same day, outpatient treatment. Ablation helps avoid major surgery (such as a hysterectomy). A hysterectomy is removal of the cervix and uterus. Endometrial ablation has less risk and complications, has a shorter recovery period and is less expensive. After endometrial ablation, most women will have little or no menstrual bleeding. You may not keep your fertility. Pregnancy is no longer likely after this procedure but if you are pre-menopausal, you still need to use a reliable method of birth control following the procedure because pregnancy can occur. REASONS TO HAVE THE PROCEDURE MAY INCLUDE:  Heavy periods.   Bleeding that is causing anemia.   Anovulatory bleeding, very irregular, bleeding.   Bleeding submucous fibroids (on the lining inside the uterus) if they are smaller than 3 centimeters.  REASONS NOT TO HAVE THE PROCEDURE MAY INCLUDE:  You wish to have more children.   You have a pre-cancerous or cancerous problem. The cause of any abnormal bleeding must be diagnosed before having the procedure.   You have pain coming from the uterus.   You have a submucus fibroid larger than 3 centimeters.   You recently had a baby.   You recently had an infection in the uterus.   You have a severe retro-flexed, tipped uterus and cannot insert the instrument to do the ablation.   You had a Cesarean section or deep major surgery on the uterus.   The inner cavity of the uterus is too large for the endometrial ablation instrument.  RISKS AND COMPLICATIONS   Perforation of the uterus.   Bleeding.   Infection of the uterus, bladder or vagina.   Injury to surrounding organs.   Cutting the cervix.   An air bubble to the lung (air embolus).   Pregnancy following the procedure.   Failure of the procedure to help the problem requiring hysterectomy.   Decreased ability to diagnose  cancer in the lining of the uterus.  BEFORE THE PROCEDURE  The lining of the uterus must be tested to make sure there is no pre-cancerous or cancer cells present.   Medications may be given to make the lining of the uterus thinner.   Ultrasound may be used to evaluate the size and look for abnormalities of the uterus.   Future pregnancy is not desired.  PROCEDURE  There are different ways to destroy the lining of the uterus.   Resectoscope - radio frequency-alternating electric current is the most common one used.   Cryotherapy - freezing the lining of the uterus.   Heated Free Liquid - heated salt (saline) solution inserted into the uterus.   Microwave - uses high energy microwaves in the uterus.   Thermal Balloon - a catheter with a balloon tip is inserted into the uterus and filled with heated fluid.  Your caregiver will talk with you about the method used in this clinic. They will also instruct you on the pros and cons of the procedure. Endometrial ablation is performed along with a procedure called operative hysteroscopy. A narrow viewing tube is inserted through the birth canal (vagina) and through the cervix into the uterus. A tiny camera attached to the viewing tube (hysteroscope) allows the uterine cavity to be shown on a TV monitor during surgery. Your uterus is filled with a harmless liquid to make the procedure easier. The lining of the uterus is then removed. The lining can also be removed with a resectoscope which allows your surgeon   to cut away the lining of the uterus under direct vision. Usually, you will be able to go home within an hour after the procedure. HOME CARE INSTRUCTIONS   Do not drive for 24 hours.   No tampons, douching or intercourse for 2 weeks or until your caregiver approves.   Rest at home for 24 to 48 hours. You may then resume normal activities unless told differently by your caregiver.   Take your temperature two times a day for 4 days, and record  it.   Take any medications your caregiver has ordered, as directed.   Use some form of contraception if you are pre-menopausal and do not want to get pregnant.  Bleeding after the procedure is normal. It varies from light spotting and mildly watery to bloody discharge for 4 to 6 weeks. You may also have mild cramping. Only take over-the-counter or prescription medicines for pain, discomfort, or fever as directed by your caregiver. Do not use aspirin, as this may aggravate bleeding. Frequent urination during the first 24 hours is normal. You will not know how effective your surgery is until at least 3 months after the surgery. SEEK IMMEDIATE MEDICAL CARE IF:   Bleeding is heavier than a normal menstrual cycle.   An oral temperature above 102 F (38.9 C) develops.   You have increasing cramps or pains not relieved with medication or develop belly (abdominal) pain which does not seem to be related to the same area of earlier cramping and pain.   You are light headed, weak or have fainting episodes.   You develop pain in the shoulder strap areas.   You have chest or leg pain.   You have abnormal vaginal discharge.   You have painful urination.  Document Released: 03/13/2004 Document Revised: 04/23/2011 Document Reviewed: 06/11/2007 [employer] Patient Information 2012 [employer]. 

## 2011-11-06 NOTE — Progress Notes (Signed)
History:  41 y.o. [id number] here today for discussion about surgical management of ovarian cysts, endometritis and abnormal uterine bleeding. She has a Paragard IUD in place and evidence of endometritis on imaging, has been treated with antibiotics and pain medications but is not getting better.  She  has had several visits to the emergency department in the last couple of weeks for abdominal pain in upper and lower [parts of her abdomen.  She is also status post 2 recent surgeries. On 10/05/11 she underwent a laparoscopic appendectomy by Dr. Daphine Deutscher. The pathology report confirmed acute appendicitis but no other abnormality. She was discharged home on postoperative day #1. She was readmitted on 10/12/11 with ongoing right-sided abdominal pain. She underwent an ultrasound which showed a stone lodged in the neck of her gallbladder with some gallbladder wall thickening. I met her briefly before performing a laparoscopic cholecystectomy with intraoperative cholangiogram on 10/12/11. There were some adhesions to the gallbladder. The pathology confirmed chronic calculus cholecystitis. The patient was discharged home on 10/13/11.   She had a pelvic ultrasound that showed small 1.9 cm R ovarian cyst, and small myometrial cysts concerning for endometritis, IUD in place.  She was told she needs the IUD removed to help with her endometritis, and may need surgery for her cyst, and also surgery on her uterus to help with her pain and bleeding.  Patient is very frustrated and wants something done now.  The following portions of the patient's history were reviewed and updated as appropriate: allergies, current medications, past family history, past medical history, past social history, past surgical history and problem list.  Patient Active Problem List  Diagnosis  . MORBID OBESITY  . ANEMIA, IRON DEFICIENCY, UNSPEC.  [name] ANXIETY  . PANIC ATTACKS  . OBSESSIVE COMPUL. DISORDER  . HYPERTENSION, BENIGN SYSTEMIC  .  GASTROESOPHAGEAL REFLUX, NO ESOPHAGITIS  . SYSTOLIC MURMUR  . Appendicitis, acute  . Chronic calculus cholecystitis  . Menorrhagia  . Endometritis    Review of Systems:  Pertinent items are noted in HPI.  Objective:  Physical Exam Blood pressure 135/85, pulse 82, temperature 99.9 F (37.7 C), temperature source Oral, height 5\' 7"  (1.702 m), weight 272 lb 12.8 oz (123.741 kg), last menstrual period 10/22/2011. Gen: NAD Abd: Soft, obese, diffuse tenderness especially in the lower abdomen, well-healing laparoscopic incisions, nondistended Pelvic: Normal appearing external genitalia; normal appearing vaginal mucosa and cervix.  Small amount of blood in vault IUD strings seen.   Unable to palpate uterus or adnexae secondary to habitus. Diffuse discomfort reported by patient during examination  ENDOMETRIAL BIOPSY AND IUD REMOVAL  The indications for endometrial biopsy and IUD removal were reviewed.   Risks of the biopsy including cramping, bleeding, infection, uterine perforation, inadequate specimen and need for additional procedures  were discussed. The patient states she understands and agrees to undergo procedures today. Consent was signed. Time out was performed.  A sterile speculum was placed in the patient's vagina and the cervix was prepped with Betadine. The IUD strings were visualized and grasped with ring forceps; Paragard IUD removed in its entirety.  The 3 mm pipelle was then introduced into the endometrial cavity without difficulty to a depth of 10 cm, and a moderate amount of tissue was obtained and sent to pathology. All instruments were removed from the patient's vagina. Minimal bleeding from the cervix was noted. The patient tolerated the procedure well. Routine post-procedure instructions were given to the patient.   Labs and Imaging 10/18/2011  TRANSABDOMINAL AND TRANSVAGINAL ULTRASOUND  OF PELVIS  Clinical Data: Follow-up CT scan.   Comparison: CT scan, same date.  Findings:   Uterus: Measures 11.1 x 5.4 x 6.5 cm.  IUD is noted in the endometrial canal and appears to be normally located.  Two small cystic appearing areas in the myometrium correlate with the CT findings and could be secondary to endometritis or prominent endometrial glands.  Endometrium: Normal in thickness.  A small amount of fluid noted in the endocervical canal.  Right ovary:  Measures 3.6 x 3.2 x 3.8 cm.  Small cysts are noted.  Left ovary: Measures 2.1 x 1.8 x 1.8 cm.  No cysts or masses.  Other findings: No free fluid  IMPRESSION:  1.  Cystic areas in the myometrium correlate with the CT findings and could be due to endometritis or complex endometrial glands. 2.  1.9 x 1.7 x 1.4 cyst associated with the right ovary. 3.  Small amount of fluid in the endocervical canal.  Original Report Authenticated By: P. Loralie Champagne, M.D.   10/18/2011  CT ABDOMEN AND PELVIS WITH CONTRAST Clinical Data: Status post appendectomy 13 days ago. Status post cholecystectomy 6 days ago.  Nausea and vomiting.    Comparison: CT abdomen and pelvis 10/05/2011.  Findings: Low lung bases are clear.  No pleural or pericardial effusion.  Cholecystectomy clips are noted.  There is no fluid in the gallbladder fossa.  The biliary tree is unremarkable.  The liver, spleen, adrenal glands, pancreas and kidneys appear normal.  Postoperative change of appendectomy is noted.  No abscess or other complicating feature is identified.  The stomach and small and large bowel appear normal.  There is new soft tissue fullness in the right adnexa.  Also seen are two new cystic lesions within the myometrium in the fundus of the uterus.  One of these has a septation. On the prior study, the patient's inflamed appendix was nearly contiguous with the right ovary.  Left adnexa is unremarkable.  No lymphadenopathy is identified.  IMPRESSION:  1.  New soft tissue fullness in the right adnexa with two new cystic lesions in the myometrium of the fundus of the uterus may  be due to infectious or inflammatory process related to appendicitis. Recommend correlation with physical examination.  Pelvic ultrasound could also be used for further evaluation. 2.  Status post cholecystectomy without evidence of complication.  Original Report Authenticated By: Bernadene Bell. Maricela Curet, M.D.   Assessment & Plan:   Patient was informed that her ovarian cyst was small and physiologic, no surgical intervention needed.  IUD has been removed, this will help with her pain if the pain is caused by endometritis.  For her bleeding, Megace was prescribed. Vicoprofen was prescribed as needed for pain.  She was told to use contraception if she will have intercourse.  Discussed other management options for abnormal uterine bleeding including continued oral Provera, Depo Provera, Mirena IUD, endometrial ablation (Novasure/Hydrothermal Ablation/Thermachoice) or hysterectomy as definitive surgical management.  Discussed risks and benefits of each method.  Patient desires surgical management with hydrothermal ablation .  The risks of surgery were discussed in detail with the patient including but not limited to: bleeding which may require transfusion or reoperation; infection which may require prolonged hospitalization or re-hospitalization and antibiotic therapy; injury to bowel, bladder, ureters and major vessels or other surrounding organs; need for additional procedures including laparoscopy; and other postoperative or anesthesia complications.  Patient was told that the likelihood that her condition and symptoms will be treated effectively with  this surgical management was very high; the postoperative expectations were also discussed in detail. The patient also understands the alternative treatment options which were discussed in full. All questions were answered.  She was told that she will be contacted by our surgical scheduler regarding the time and date of her surgery; routine preoperative instructions of  having nothing to eat or drink after midnight on the day prior to surgery and also coming to the hospital 1 1/2 hours prior to her time of surgery were also emphasized.  She was told she may be called for a preoperative appointment about a week prior to surgery and will be given further preoperative instructions at that visit. Printed patient education handouts about the procedure were given to the patient to review at home. Bleeding precautions reviewed.   Will follow up endometrial biopsy results and manage accordingly.

## 2011-11-09 ENCOUNTER — Telehealth (INDEPENDENT_AMBULATORY_CARE_PROVIDER_SITE_OTHER): Payer: Self-pay | Admitting: General Surgery

## 2011-11-09 ENCOUNTER — Telehealth: Payer: Self-pay | Admitting: *Deleted

## 2011-11-09 ENCOUNTER — Telehealth (INDEPENDENT_AMBULATORY_CARE_PROVIDER_SITE_OTHER): Payer: Self-pay

## 2011-11-09 DIAGNOSIS — R102 Pelvic and perineal pain: Secondary | ICD-10-CM

## 2011-11-09 NOTE — Telephone Encounter (Signed)
Patient called in requesting a refill on her pain medication, patient reports the Vicoprofen gave her migraine's, she's scheduled for surgery with her OB GYN in late July.  Patient reports she called their office for a refill and they told her to contact her surgeon's office to manage her pain medication.  She denies any fever, nausea or vomiting but reports continuous pain.

## 2011-11-09 NOTE — Telephone Encounter (Signed)
The patient just received a refill 7 days ago for vicodin.

## 2011-11-09 NOTE — Telephone Encounter (Signed)
Called pt back and told her that we will not give another Rx for pain and the pt was ok with that, she also stated that she is lined up to another doctor and if she still need pain meds that she will call their office

## 2011-11-09 NOTE — Telephone Encounter (Signed)
[name] called and left a message stating she saw Dr. On Friday and put her on a medicine and said if it didn't work , could call in something else- I sent in a refill request and didn't know it would be for the same thing- I think the med is vicoprofen- it  Didn't really work as far as pain and gave me a migraine- can you call me in something else? And call me back?

## 2011-11-10 MED ORDER — HYDROCODONE-ACETAMINOPHEN 5-500 MG PO TABS: 1.0000 | ORAL_TABLET | Freq: Four times a day (QID) | ORAL | Status: AC | PRN

## 2011-11-10 NOTE — Telephone Encounter (Signed)
You can call in Vicodin 5/500mg  for her; I have ordered it as a call in medication.

## 2011-11-10 NOTE — Telephone Encounter (Signed)
Called pt and informed pt that I would call in her Rx from the provider, Vicodin, to her pharmacy.  Pt stated understanding and had no further questions.  Called in to pharmacy to call in Rx to pharmacist.

## 2011-11-16 ENCOUNTER — Telehealth (INDEPENDENT_AMBULATORY_CARE_PROVIDER_SITE_OTHER): Payer: Self-pay | Admitting: General Surgery

## 2011-11-16 NOTE — Telephone Encounter (Signed)
Patient calling in complaining of severe abdominal pain. States she has had continuous abdominal pain since surgery, but now it has escalated. Pain is to the right of her umbilicus and travels all along the right side of her abdomen. Every time she eats or drinks she is vomiting. She also states every time she has a bowel movement she is vomiting. I advised patient to be evaluated at the ER and if surgically related they will contact our on call MD. Patient had surgery for her appendix on 10/08/11 and her gallbladder on 10/12/11.

## 2011-11-17 ENCOUNTER — Ambulatory Visit (INDEPENDENT_AMBULATORY_CARE_PROVIDER_SITE_OTHER): Payer: Self-pay | Admitting: Surgery

## 2011-11-17 ENCOUNTER — Encounter (INDEPENDENT_AMBULATORY_CARE_PROVIDER_SITE_OTHER): Payer: Self-pay | Admitting: Surgery

## 2011-11-17 VITALS — BP 136/88 | HR 79 | Temp 97.6°F | Ht 67.0 in | Wt 276.8 lb

## 2011-11-17 DIAGNOSIS — K3580 Unspecified acute appendicitis: Secondary | ICD-10-CM

## 2011-11-17 DIAGNOSIS — K8010 Calculus of gallbladder with chronic cholecystitis without obstruction: Secondary | ICD-10-CM

## 2011-11-17 MED ORDER — OXYCODONE-ACETAMINOPHEN 5-325 MG PO TABS: 1.0000 | ORAL_TABLET | ORAL | Status: AC | PRN

## 2011-11-17 MED ORDER — OMEPRAZOLE 20 MG PO CPDR: 20.0000 mg | DELAYED_RELEASE_CAPSULE | Freq: Every day | ORAL | Status: DC

## 2011-11-17 NOTE — Progress Notes (Signed)
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 She has been evaluated by gynecology and is scheduled for an endometrial ablation at the end of this month. She continues to have pain after eating to the right of her umbilicus appears described a burning sensation whenever she tries to eat. She also describes some nausea. Bowel movements are normal.  Since her last surgery she has had normal LFTs, normal lipase, and a CT scan which showed only a right adnexal mass and some abnormalities of the myometrium. She has had her IUD removed and has the ablation schedule.  On examination, her abdomen is not distended. She is mildly tender palpation to the right of her umbilicus. No palpable masses in this area. She also has some mild tenderness in her epigastrium.  We will try some Prilosec as the patient has a lot of reflux symptoms as well as the postprandial burning. She may have some gastritis. I also refilled her Percocet. We will reevaluate her after her gynecologic surgery to see if she is improved. At this point I'm not sure what is causing her pain. She may require GI workup with an EGD and colonoscopy.  Wilmon Arms. Corliss Skains, MD, [facility]  11/17/2011 10:35 AM

## 2011-11-20 ENCOUNTER — Emergency Department (HOSPITAL_COMMUNITY)
Admission: EM | Admit: 2011-11-20 | Discharge: 2011-11-21 | Disposition: A | Discharge status: Home / Self Care | Payer: Medicaid Other | Attending: Emergency Medicine | Admitting: Emergency Medicine

## 2011-11-20 ENCOUNTER — Encounter (HOSPITAL_COMMUNITY): Payer: Self-pay | Admitting: Emergency Medicine

## 2011-11-20 DIAGNOSIS — J45909 Unspecified asthma, uncomplicated: Secondary | ICD-10-CM | POA: Insufficient documentation

## 2011-11-20 DIAGNOSIS — R109 Unspecified abdominal pain: Secondary | ICD-10-CM

## 2011-11-20 DIAGNOSIS — R1013 Epigastric pain: Secondary | ICD-10-CM | POA: Insufficient documentation

## 2011-11-20 DIAGNOSIS — Z9089 Acquired absence of other organs: Secondary | ICD-10-CM | POA: Insufficient documentation

## 2011-11-20 DIAGNOSIS — I10 Essential (primary) hypertension: Secondary | ICD-10-CM | POA: Insufficient documentation

## 2011-11-20 DIAGNOSIS — F411 Generalized anxiety disorder: Secondary | ICD-10-CM | POA: Insufficient documentation

## 2011-11-20 DIAGNOSIS — F17200 Nicotine dependence, unspecified, uncomplicated: Secondary | ICD-10-CM | POA: Insufficient documentation

## 2011-11-20 LAB — COMPREHENSIVE METABOLIC PANEL
Albumin: 3.5 g/dL (ref 3.5–5.2)
BUN: 5 mg/dL — ABNORMAL LOW (ref 6–23)
Calcium: 9.3 mg/dL (ref 8.4–10.5)
Chloride: 105 mEq/L (ref 96–112)
Creatinine, Ser: 0.57 mg/dL (ref 0.50–1.10)
GFR calc non Af Amer: >90 mL/min (ref >90)
Total Bilirubin: 0.2 mg/dL — ABNORMAL LOW (ref 0.3–1.2)

## 2011-11-20 LAB — CBC WITH DIFFERENTIAL/PLATELET
Basophils Absolute: 0 10*3/uL (ref 0.0–0.1)
Basophils Relative: 0 % (ref 0–1)
Eosinophils Relative: 5 % (ref 0–5)
HCT: 35.7 % — ABNORMAL LOW (ref 36.0–46.0)
Hemoglobin: 10.8 g/dL — ABNORMAL LOW (ref 12.0–15.0)
MCH: 23.4 pg — ABNORMAL LOW (ref 26.0–34.0)
MCHC: 30.3 g/dL (ref 30.0–36.0)
MCV: 77.3 fL — ABNORMAL LOW (ref 78.0–100.0)
Monocytes Absolute: 0.4 10*3/uL (ref 0.1–1.0)
Monocytes Relative: 6 % (ref 3–12)
Neutro Abs: 4.7 10*3/uL (ref 1.7–7.7)
RDW: 15.8 % — ABNORMAL HIGH (ref 11.5–15.5)

## 2011-11-20 LAB — URINALYSIS, ROUTINE W REFLEX MICROSCOPIC
Bilirubin Urine: NEGATIVE
Glucose, UA: NEGATIVE mg/dL
Hgb urine dipstick: NEGATIVE
Ketones, ur: NEGATIVE mg/dL
Protein, ur: NEGATIVE mg/dL
Urobilinogen, UA: 0.2 mg/dL (ref 0.0–1.0)

## 2011-11-20 LAB — LIPID PANEL
Cholesterol: 162 mg/dL (ref 0–200)
HDL: 43 mg/dL (ref >39)
Triglycerides: 105 mg/dL (ref <150)

## 2011-11-20 LAB — POCT PREGNANCY, URINE: Preg Test, Ur: NEGATIVE

## 2011-11-20 MED ORDER — HYDROMORPHONE HCL PF 2 MG/ML IJ SOLN: 2.0000 mg | Freq: Once | INTRAMUSCULAR | Status: DC

## 2011-11-20 MED ORDER — ONDANSETRON 4 MG PO TBDP: 8.0000 mg | ORAL_TABLET | Freq: Once | ORAL | Status: DC

## 2011-11-20 MED ORDER — ONDANSETRON HCL 4 MG/2ML IJ SOLN
4.0000 mg | Freq: Once | INTRAMUSCULAR | Status: AC
  Administered 2011-11-20: 4 mg via INTRAVENOUS
  Filled 2011-11-20: qty 2

## 2011-11-20 MED ORDER — HYDROMORPHONE HCL PF 1 MG/ML IJ SOLN
1.0000 mg | Freq: Once | INTRAMUSCULAR | Status: AC
  Administered 2011-11-20: 1 mg via INTRAVENOUS
  Filled 2011-11-20: qty 1

## 2011-11-20 MED ORDER — OXYCODONE HCL 10 MG PO TB12
10.0000 mg | ORAL_TABLET | Freq: Once | ORAL | Status: AC
  Administered 2011-11-21: 10 mg via ORAL
  Filled 2011-11-20: qty 1

## 2011-11-20 MED ORDER — SODIUM CHLORIDE 0.9 % IV BOLUS (SEPSIS)
1000.0000 mL | Freq: Once | INTRAVENOUS | Status: AC
  Administered 2011-11-20: 1000 mL via INTRAVENOUS

## 2011-11-20 NOTE — ED Notes (Signed)
MD at bedside. 

## 2011-11-20 NOTE — ED Notes (Signed)
Pt blood pressure taken in left forearm.

## 2011-11-20 NOTE — ED Notes (Signed)
Epigastric pain for a week and a half. Pain is constant. When eating, the pain gets worse. Almost fainted today while on the phone.

## 2011-11-20 NOTE — ED Provider Notes (Signed)
History     CSN: [id number]  Arrival date & time 11/20/11  1704   First MD Initiated Contact with Patient 11/20/11 1908      Chief Complaint  Patient presents with  . Abdominal Pain    (Consider location/radiation/quality/duration/timing/severity/associated sxs/prior treatment) Patient is a 41 y.o. female presenting with abdominal pain. The history is provided by the patient.  Abdominal Pain The primary symptoms of the illness include abdominal pain and nausea. The primary symptoms of the illness do not include fever, fatigue, shortness of breath, vomiting, diarrhea, hematochezia, dysuria or vaginal discharge. Episode onset: about 1.5 weeks. The onset of the illness was gradual. The problem has not changed since onset. The abdominal pain is located in the epigastric region. The abdominal pain radiates to the periumbilical region. Relieved by: pain medicine. The abdominal pain is exacerbated by vomiting and eating.  The illness is associated with NSAID use. The patient states that she believes she is currently not pregnant. The patient has not had a change in bowel habit. Risk factors for an acute abdominal problem include a history of abdominal surgery. Symptoms associated with the illness do not include chills, diaphoresis, hematuria or back pain. Significant associated medical issues include gallstones.    Past Medical History  Diagnosis Date  . Hypertension   . Anxiety   . Panic attack   . TMJ disease   . Acid reflux   . Anemia   . Asthma   . Heart murmur     Past Surgical History  Procedure Date  . Eye surgery   . Dilitation and curettage   . Ankle surgery   . Laparoscopic appendectomy 10/05/2011    Procedure: APPENDECTOMY LAPAROSCOPIC;  Surgeon: Valarie Merino, MD;  Location: [facility] OR;  Service: General;  Laterality: N/A;  . Cholecystectomy 10/12/2011    Procedure: LAPAROSCOPIC CHOLECYSTECTOMY WITH INTRAOPERATIVE CHOLANGIOGRAM;  Surgeon: Wilmon Arms. Corliss Skains, MD;  Location: [facility]  OR;  Service: General;  Laterality: N/A;  . Appendectomy     Family History  Problem Relation Age of Onset  . Hypertension Other     History  Substance Use Topics  . Smoking status: Current Everyday Smoker  . Smokeless tobacco: Never Used  . Alcohol Use: No    OB History    Grav Para Term Preterm Abortions TAB SAB Ect Mult Living   5 3 3  2  2   3       Review of Systems  Constitutional: Negative for fever, chills, diaphoresis and fatigue.  HENT: Negative for ear pain, congestion, sore throat, facial swelling, mouth sores, trouble swallowing, neck pain and neck stiffness.   Eyes: Negative.   Respiratory: Negative for apnea, cough, chest tightness, shortness of breath and wheezing.   Cardiovascular: Negative for chest pain, palpitations and leg swelling.  Gastrointestinal: Positive for nausea and abdominal pain. Negative for vomiting, diarrhea, blood in stool, hematochezia, abdominal distention and anal bleeding.  Genitourinary: Negative for dysuria, hematuria, flank pain, vaginal discharge, difficulty urinating and menstrual problem.  Musculoskeletal: Negative for back pain and gait problem.  Skin: Negative for rash and wound.  Neurological: Negative for dizziness, tremors, seizures, syncope, facial asymmetry, numbness and headaches.  Psychiatric/Behavioral: Negative.   All other systems reviewed and are negative.    Allergies  Morphine and related and Paroxetine  Home Medications   Current Outpatient Rx  Name Route Sig Dispense Refill  . ALPRAZOLAM 1 MG PO TABS Oral Take 1 mg by mouth 3 (three) times daily as needed. For  anxiety    . FAMOTIDINE 20 MG PO TABS Oral Take 2 tablets (40 mg total) by mouth 2 (two) times daily. 30 tablet 0  . HYDROCODONE-ACETAMINOPHEN 5-500 MG PO TABS Oral Take 1-2 tablets by mouth every 6 (six) hours as needed for pain. 30 tablet 1  . IBUPROFEN 200 MG PO TABS Oral Take 400 mg by mouth once.    [name] LISINOPRIL 10 MG PO TABS Oral Take 10 mg by  mouth daily.     [name] OMEPRAZOLE 20 MG PO CPDR Oral Take 1 capsule (20 mg total) by mouth daily. 30 capsule 3  . OXYCODONE-ACETAMINOPHEN 5-325 MG PO TABS Oral Take 1 tablet by mouth every 4 (four) hours as needed for pain. 40 tablet 0  . PROMETHAZINE HCL 25 MG PO TABS Oral Take 25 mg by mouth every 6 (six) hours as needed. For nausea    . ZOLPIDEM TARTRATE 5 MG PO TABS Oral Take 1-2 tablets (5-10 mg total) by mouth at bedtime as needed for sleep. 30 tablet 3  . METOCLOPRAMIDE HCL 10 MG PO TABS Oral Take 1 tablet (10 mg total) by mouth 4 (four) times daily. 50 tablet 2    BP 129/72  Pulse 84  Temp 98.3 F (36.8 C) (Oral)  Resp 14  SpO2 97%  LMP 10/22/2011  Physical Exam  Nursing note and vitals reviewed. Constitutional: She is oriented to person, place, and time. She appears well-developed and well-nourished. No distress.  HENT:  Head: Normocephalic and atraumatic.  Right Ear: External ear normal.  Left Ear: External ear normal.  Nose: Nose normal.  Mouth/Throat: Oropharynx is clear and moist. No oropharyngeal exudate.  Eyes: Conjunctivae and EOM are normal. Pupils are equal, round, and reactive to light. Right eye exhibits no discharge. Left eye exhibits no discharge.  Neck: Normal range of motion. Neck supple. No JVD present. No tracheal deviation present. No thyromegaly present.  Cardiovascular: Normal rate, regular rhythm, normal heart sounds and intact distal pulses.  Exam reveals no gallop and no friction rub.   No murmur heard. Pulmonary/Chest: Effort normal and breath sounds normal. No respiratory distress. She has no wheezes. She has no rales. She exhibits no tenderness.  Abdominal: Soft. Bowel sounds are normal. She exhibits no distension. There is no tenderness. There is no rebound and no guarding.  Musculoskeletal: Normal range of motion.  Lymphadenopathy:    She has no cervical adenopathy.  Neurological: She is alert and oriented to person, place, and time. No cranial nerve  deficit. Coordination normal.  Skin: Skin is warm. No rash noted. She is not diaphoretic.  Psychiatric: She has a normal mood and affect. Her behavior is normal. Judgment and thought content normal.    ED Course  Procedures (including critical care time)  Labs Reviewed  CBC WITH DIFFERENTIAL - Abnormal; Notable for the following:    Hemoglobin 10.8 (*)     HCT 35.7 (*)     MCV 77.3 (*)     MCH 23.4 (*)     RDW 15.8 (*)     All other components within normal limits  COMPREHENSIVE METABOLIC PANEL - Abnormal; Notable for the following:    BUN 5 (*)     Total Bilirubin 0.2 (*)     All other components within normal limits  URINALYSIS, ROUTINE W REFLEX MICROSCOPIC  POCT PREGNANCY, URINE  LIPID PANEL  LIPASE, BLOOD  ACETAMINOPHEN LEVEL   No results found.   No diagnosis found.    MDM  41 year old  female patient with past medical history of recently diagnosed cholecystitis status post cholecystectomy and appendectomy presents with continued epigastric pain. Patient saw her surgeon 2 days ago who evaluated her for this abdominal pain in his noted his abdominal evaluation is similar to mine. Patient says that she feels the same way today as when she saw her surgeon 2 days ago. Patient has had multiple lab draws and CT status post surgery have all been normal. The surgeon's exam confirms a she's got nonsurgical issues in her abdomen. Today patient's abdomen is nontender nondistended no peritoneal signs. Patient says that her pain has not changed over the past few weeks. Patient says is worse with eating she feels nauseated has infrequent bowel movements because of decreased oral intake. Does not complain of dysuria or GU issues. Labs today did not show pancreatitis hepatitis or any other signs that she could have retained stones. Patient may have gastritis peptic ulcer disease or esophageal issues. Patient also says that she's been taking 2 5-500 Percocets every 6 hours for the past few days  and may have an increased acetaminophen level. Patient otherwise appears well afebrile vital signs stable.  Results for orders placed during the hospital encounter of 11/20/11  URINALYSIS, ROUTINE W REFLEX MICROSCOPIC      Component Value Range   Color, Urine YELLOW  YELLOW   APPearance CLEAR  CLEAR   Specific Gravity, Urine 1.006  1.005 - 1.030   pH 7.0  5.0 - 8.0   Glucose, UA NEGATIVE  NEGATIVE mg/dL   Hgb urine dipstick NEGATIVE  NEGATIVE   Bilirubin Urine NEGATIVE  NEGATIVE   Ketones, ur NEGATIVE  NEGATIVE mg/dL   Protein, ur NEGATIVE  NEGATIVE mg/dL   Urobilinogen, UA 0.2  0.0 - 1.0 mg/dL   Nitrite NEGATIVE  NEGATIVE   Leukocytes, UA NEGATIVE  NEGATIVE  POCT PREGNANCY, URINE      Component Value Range   Preg Test, Ur NEGATIVE  NEGATIVE  CBC WITH DIFFERENTIAL      Component Value Range   WBC 7.7  4.0 - 10.5 K/uL   RBC 4.62  3.87 - 5.11 MIL/uL   Hemoglobin 10.8 (*) 12.0 - 15.0 g/dL   HCT 40.9 (*) 81.1 - 91.4 %   MCV 77.3 (*) 78.0 - 100.0 fL   MCH 23.4 (*) 26.0 - 34.0 pg   MCHC 30.3  30.0 - 36.0 g/dL   RDW 78.2 (*) 95.6 - 21.3 %   Platelets 312  150 - 400 K/uL   Neutrophils Relative 61  43 - 77 %   Neutro Abs 4.7  1.7 - 7.7 K/uL   Lymphocytes Relative 28  12 - 46 %   Lymphs Abs 2.2  0.7 - 4.0 K/uL   Monocytes Relative 6  3 - 12 %   Monocytes Absolute 0.4  0.1 - 1.0 K/uL   Eosinophils Relative 5  0 - 5 %   Eosinophils Absolute 0.4  0.0 - 0.7 K/uL   Basophils Relative 0  0 - 1 %   Basophils Absolute 0.0  0.0 - 0.1 K/uL  COMPREHENSIVE METABOLIC PANEL      Component Value Range   Sodium 138  135 - 145 mEq/L   Potassium 4.0  3.5 - 5.1 mEq/L   Chloride 105  96 - 112 mEq/L   CO2 24  19 - 32 mEq/L   Glucose, Bld 96  70 - 99 mg/dL   BUN 5 (*) 6 - 23 mg/dL   Creatinine, Ser  0.57  0.50 - 1.10 mg/dL   Calcium 9.3  8.4 - 35.3 mg/dL   Total Protein 6.5  6.0 - 8.3 g/dL   Albumin 3.5  3.5 - 5.2 g/dL   AST 16  0 - 37 U/L   ALT 11  0 - 35 U/L   Alkaline Phosphatase 109  39 -  117 U/L   Total Bilirubin 0.2 (*) 0.3 - 1.2 mg/dL   GFR calc non Af Amer >90  >90 mL/min   GFR calc Af Amer >90  >90 mL/min  LIPID PANEL      Component Value Range   Cholesterol 162  0 - 200 mg/dL   Triglycerides 614  <431 mg/dL   HDL 43  >54 mg/dL   Total CHOL/HDL Ratio 3.8     VLDL 21  0 - 40 mg/dL   LDL Cholesterol 98  0 - 99 mg/dL  LIPASE, BLOOD      Component Value Range   Lipase 14  11 - 59 U/L  ACETAMINOPHEN LEVEL      Component Value Range   Acetaminophen (Tylenol), Serum <15.0  10 - 30 ug/mL     Patient normal acetaminophen level and LFTs. The patient the patient feels better after pain managed in the emergency department. Patient tolerating by mouth's with minimal abdominal pain. Patient advised to stop taking multiple prescriptions for Percocet Vicodin and to avoid NSAIDs. Patient also instructed on how much Tylenol to his Tylenol to take. Patient given prescription for pain medication without Tylenol it. Patient given instructions to followup with her surgeon and a GI physician. Patient likely has gastritis peptic ulcer disease esophageal disease.  Case discussed with Dr. Burnett Corrente, MD 11/21/11 [id number]

## 2011-11-20 NOTE — ED Notes (Signed)
endometriosis - having surgery on 12/14/2011 to having the lining of the uterus removed

## 2011-11-20 NOTE — ED Notes (Addendum)
Pt states that she has had epigastric pain radiating down to umbilicus x1.5wks. Pt states that the pain is worse than her pre-appendectomy and cholecystectomy pain. Pt states "I have a constant taste of bile and acid coming up my throat too." Pt states pain is at worst after eating/drinking. Pain is bearable at rest but any movement or palpation causes sharp increase in pain. LBM- 11/18/2011. Pt states she is always nauseous and has been vomiting x1.5 wks "on and off." Pt states her abd is getting very swollen and she is unable to fit in her pants.

## 2011-11-20 NOTE — ED Notes (Signed)
Pt. Ambulated to RR unassisted. Resting in bed with call light within reach.

## 2011-11-20 NOTE — ED Notes (Signed)
Gal bladder and appendix removed in May, 2013.

## 2011-11-20 NOTE — ED Provider Notes (Signed)
I saw and evaluated the patient, reviewed the resident's note and I agree with the findings and plan.  Mildly tender in the epigastrium and right upper quadrant without rebound.  [name], MD 11/21/11 1344

## 2011-11-21 MED ORDER — OXYCODONE HCL 5 MG PO TABS: 5.0000 mg | ORAL_TABLET | Freq: Four times a day (QID) | ORAL | Status: DC | PRN

## 2011-11-21 NOTE — ED Notes (Signed)
Pt d/c home in NAD. Pt voiced understanding of d/c instructions and follow up care. Pt instructed not to drive after taking narcotics.  

## 2011-11-23 ENCOUNTER — Telehealth (INDEPENDENT_AMBULATORY_CARE_PROVIDER_SITE_OTHER): Payer: Self-pay | Admitting: General Surgery

## 2011-11-23 ENCOUNTER — Telehealth: Payer: Self-pay | Admitting: *Deleted

## 2011-11-23 DIAGNOSIS — R102 Pelvic and perineal pain: Secondary | ICD-10-CM

## 2011-11-23 MED ORDER — OXYCODONE HCL 5 MG PO TABS: 5.0000 mg | ORAL_TABLET | Freq: Four times a day (QID) | ORAL | Status: DC | PRN

## 2011-11-23 NOTE — Telephone Encounter (Signed)
Pt informed

## 2011-11-23 NOTE — Telephone Encounter (Signed)
Patient left a message requesting that pain medicine be called in for her. She states that the vicodin is not working. She went to ER over the weekend and was told that she has too much tylenol in her system. Will route to Dr. Macon Large for review.

## 2011-11-23 NOTE — Telephone Encounter (Signed)
Patient called in stated that she was in pain and needed more pain medication. Patient stated that she called our office on Friday and requested a refill but did not get a call back. I placed call on hold and looking at the chart saw that Dr. Corliss Skains already refilled her pain medication (percocet) last week and that another prescription could not be issued. I called Pattricia Boss to confirm and she agreed. I advised the patient that we would not be able to issue another prescription for pain medication. I also advised that she contact the doctor who will be performing her surgery on 12/14/11 to advise of her pain level and see if they will be able to help her. I advised that she ask about any OTC's that she may be able to use that can help her as well.

## 2011-11-23 NOTE — Telephone Encounter (Signed)
Oxycodone refilled and printed out, patient will be called to come to pick up prescription. 

## 2011-11-24 ENCOUNTER — Telehealth: Payer: Self-pay | Admitting: *Deleted

## 2011-11-24 NOTE — Telephone Encounter (Signed)
Pt left message requesting if Dr. Macon Large would be able to provide refill of Zanax Rx.  She has been taking this medication x10 yrs for PTSD, OCD and panic attacks. She normally get this from her PCP, but has been unable to see him at the office because she does not have the co-pay.  He is unwilling to give refill w/o her being seen.  I called pt and informed her that I will send a message to Dr. Macon Large with her request.  I will not have a  response until tomorrow.  Pt voiced understanding

## 2011-11-25 ENCOUNTER — Encounter: Payer: Self-pay | Admitting: Family

## 2011-11-25 NOTE — Telephone Encounter (Signed)
I do not manage her panic attacks and OCD, and I am not comfortable prescribing the Xanax for her.  She can call her PCP and make an appointment.  Thanks.

## 2011-11-25 NOTE — Telephone Encounter (Signed)
I called [name] and informed her that Dr. Macon Large will not be able to refill the Xanax for her. She will need to follow up with her PCP.  [name] stated that she understood.

## 2011-11-27 ENCOUNTER — Telehealth: Payer: Self-pay | Admitting: *Deleted

## 2011-11-27 NOTE — Telephone Encounter (Signed)
Discussed patient request with Dr. Macon Large- request for percocet denied- patient may not have percocet because of tylenol in that , may not have any other prescriptions for pain. May take otc ibuprofen  Or tylenol if she desires- but not exceed package limits .

## 2011-11-27 NOTE — Telephone Encounter (Signed)
Kelsey called and left a message stating she picked up a prescription Monday for a medicine she thinks is called roxycodone- states she has been taking them and they are not helping- states the pharmacist told her they are not effective for some  People and reccomended percocet 5 mg. She also states she knows she can't have a lot of tylenol but apparently not much in this . States had blood work and hasn't gotten result.

## 2011-11-27 NOTE — Telephone Encounter (Signed)
Called [name] and informed her we got her request and discussed with Dr.Anyanwu and that she is not going to give her percocet or any other medicine right now. We discussed that due to her having taken too much tylenol  We do not want to give her percocet which has tylenol in it- that we do not want to do anything to damage her liver, kidneys or any organs. Patient states they told her in ER she had took too much tylenol and to not take any for at least 1 week, which it has been one week, also states she thinks the oxycodone is causing her to have migraine headaches. . Informed  [name] she  may take otc ibuprofen or tylenol if she really needs it , but we do not encourage this and she must not go over the reccomended limits on the package. May call us back early next week if she is not doing better or go to [facility] if pain severe. We discussed may need to have a pain contract if further medicine needed. [name] voices understanding.

## 2011-11-30 ENCOUNTER — Encounter (HOSPITAL_COMMUNITY): Payer: Self-pay | Admitting: Pharmacist

## 2011-12-01 ENCOUNTER — Telehealth: Payer: Self-pay | Admitting: *Deleted

## 2011-12-01 DIAGNOSIS — G8929 Other chronic pain: Secondary | ICD-10-CM

## 2011-12-01 NOTE — Telephone Encounter (Signed)
Pt left message requesting refill of pain med. She does not remember if she was told that she would have to go to [facility]U or not. Please call back.

## 2011-12-01 NOTE — Telephone Encounter (Signed)
I consulted w/Dr. Macon Large and she stated that she will complete Rx tomorrow upon return to this campus.  If pt has severe pain prior to that time and cannot be managed with Tylenol and/or ibuprofen, she may go to [facility].  I called pt, informed her of Dr. Mont Dutton recommendation and she proceeded to tell me that she has episodes of severe pain in abdomen which then causes her to fall. This has happened 3 times in the last 3 wks. She states that she has lost consciousness on some of these occasions. Today, she fell into a door and hit her face near her eye. She feels it will become bruised. I advised pt to be seen at an ED.  Pt will be contacted tomorrow when her Rx for Roxicodone is ready for pick up.  Pt voiced understanding.

## 2011-12-02 MED ORDER — OXYCODONE HCL 10 MG PO TABS: 10.0000 mg | ORAL_TABLET | Freq: Four times a day (QID) | ORAL | Status: DC | PRN

## 2011-12-02 NOTE — Telephone Encounter (Signed)
Prescription for Oxycodone 10 mg po q6h prn pain #40 written, patient will be called to have her come pick up prescription.  No more prescriptions will be given prior to her scheduled surgery on 12/14/11.  Use Tylenol/Ibuprofen for mild-moderate pain, should aim to use 3 tablets a day maximum.   We are also aware she is seeing other physicians and ED for abdominal pain and is also getting pain medications from them.  After her surgery is done, she will have to decide where she will get her narcotics and hopefully have a pain contract with them. If no GYN etiology is seen for her pain, this clinic will not be an option for continued narcotic disbursement.

## 2011-12-02 NOTE — Telephone Encounter (Signed)
Called pt and informed her that her Rx is ready to be picked up. I also stated Dr. Mont Dutton recommendation that she take Tylenol/Ibuprofen for mild-moderate pain and reserve the Oxycodone for more severe pain. She should aim to take only 3 tabs Oxycodone per [name] and this will be the last Rx she will be given prior to surgery. Pt voiced understanding.

## 2011-12-07 ENCOUNTER — Encounter (HOSPITAL_COMMUNITY): Payer: Self-pay

## 2011-12-07 ENCOUNTER — Encounter (HOSPITAL_COMMUNITY)
Admission: RE | Admit: 2011-12-07 | Discharge: 2011-12-07 | Disposition: A | Discharge status: Home / Self Care | Payer: Medicaid Other | Source: Ambulatory Visit | Attending: Obstetrics & Gynecology | Admitting: Obstetrics & Gynecology

## 2011-12-07 LAB — BASIC METABOLIC PANEL
BUN: 7 mg/dL (ref 6–23)
CO2: 29 mEq/L (ref 19–32)
Calcium: 9.2 mg/dL (ref 8.4–10.5)
Chloride: 102 mEq/L (ref 96–112)
Creatinine, Ser: 0.71 mg/dL (ref 0.50–1.10)
Glucose, Bld: 103 mg/dL — ABNORMAL HIGH (ref 70–99)

## 2011-12-07 LAB — CBC
HCT: 33.7 % — ABNORMAL LOW (ref 36.0–46.0)
MCH: 24.3 pg — ABNORMAL LOW (ref 26.0–34.0)
MCV: 80.4 fL (ref 78.0–100.0)
RBC: 4.19 MIL/uL (ref 3.87–5.11)
WBC: 7.1 10*3/uL (ref 4.0–10.5)

## 2011-12-07 NOTE — Patient Instructions (Addendum)
YOUR PROCEDURE IS SCHEDULED ON:12/14/11  ENTER THROUGH THE MAIN ENTRANCE OF [facility] AT: 11:30am  USE DESK PHONE AND DIAL [phone] TO INFORM us OF YOUR ARRIVAL  CALL [phone] IF YOU HAVE ANY QUESTIONS OR PROBLEMS PRIOR TO YOUR ARRIVAL.  REMEMBER: DO NOT EAT AFTER MIDNIGHT :Sunday  SPECIAL INSTRUCTIONS:clear liquids ok until 9am Monday   YOU MAY BRUSH YOUR TEETH THE MORNING OF SURGERY   TAKE THESE MEDICINES THE DAY OF SURGERY WITH SIP OF WATER: am meds   DO NOT WEAR JEWELRY, EYE MAKEUP, LIPSTICK OR DARK FINGERNAIL POLISH DO NOT WEAR LOTIONS  DO NOT SHAVE FOR 48 HOURS PRIOR TO SURGERY  YOU WILL NOT BE ALLOWED TO DRIVE YOURSELF HOME.

## 2011-12-14 ENCOUNTER — Ambulatory Visit (HOSPITAL_COMMUNITY)
Admission: RE | Admit: 2011-12-14 | Discharge: 2011-12-14 | Disposition: A | Discharge status: Home / Self Care | Payer: Medicaid Other | Source: Ambulatory Visit | Attending: Obstetrics & Gynecology | Admitting: Obstetrics & Gynecology

## 2011-12-14 ENCOUNTER — Ambulatory Visit (HOSPITAL_COMMUNITY): Payer: Medicaid Other | Admitting: Anesthesiology

## 2011-12-14 ENCOUNTER — Encounter (HOSPITAL_COMMUNITY)
Admission: RE | Disposition: A | Discharge status: Home / Self Care | Payer: Self-pay | Source: Ambulatory Visit | Attending: Obstetrics & Gynecology

## 2011-12-14 ENCOUNTER — Encounter (HOSPITAL_COMMUNITY): Payer: Self-pay | Admitting: *Deleted

## 2011-12-14 ENCOUNTER — Encounter (HOSPITAL_COMMUNITY): Payer: Self-pay | Admitting: Anesthesiology

## 2011-12-14 DIAGNOSIS — G8929 Other chronic pain: Secondary | ICD-10-CM

## 2011-12-14 DIAGNOSIS — Z01818 Encounter for other preprocedural examination: Secondary | ICD-10-CM | POA: Insufficient documentation

## 2011-12-14 DIAGNOSIS — N920 Excessive and frequent menstruation with regular cycle: Secondary | ICD-10-CM

## 2011-12-14 DIAGNOSIS — N719 Inflammatory disease of uterus, unspecified: Secondary | ICD-10-CM

## 2011-12-14 DIAGNOSIS — Z01812 Encounter for preprocedural laboratory examination: Secondary | ICD-10-CM | POA: Insufficient documentation

## 2011-12-14 DIAGNOSIS — N949 Unspecified condition associated with female genital organs and menstrual cycle: Secondary | ICD-10-CM | POA: Insufficient documentation

## 2011-12-14 DIAGNOSIS — N938 Other specified abnormal uterine and vaginal bleeding: Secondary | ICD-10-CM | POA: Insufficient documentation

## 2011-12-14 HISTORY — DX: Other specified postprocedural states: Z98.890

## 2011-12-14 SURGERY — DILATATION & CURETTAGE/HYSTEROSCOPY WITH HYDROTHERMAL ABLATION
Anesthesia: General | Site: Uterus | Wound class: Clean Contaminated

## 2011-12-14 MED ORDER — FENTANYL CITRATE 0.05 MG/ML IJ SOLN
INTRAMUSCULAR | Status: AC
  Filled 2011-12-14: qty 2

## 2011-12-14 MED ORDER — MIDAZOLAM HCL 2 MG/2ML IJ SOLN: 0.5000 mg | Freq: Once | INTRAMUSCULAR | Status: DC | PRN

## 2011-12-14 MED ORDER — OXYCODONE-ACETAMINOPHEN 5-325 MG PO TABS
ORAL_TABLET | ORAL | Status: AC
  Administered 2011-12-14: 1 via ORAL
  Filled 2011-12-14: qty 1

## 2011-12-14 MED ORDER — FENTANYL CITRATE 0.05 MG/ML IJ SOLN
INTRAMUSCULAR | Status: AC
  Administered 2011-12-14: 50 ug via INTRAVENOUS
  Filled 2011-12-14: qty 2

## 2011-12-14 MED ORDER — DOCUSATE SODIUM 100 MG PO CAPS: 100.0000 mg | ORAL_CAPSULE | Freq: Two times a day (BID) | ORAL | Status: DC | PRN

## 2011-12-14 MED ORDER — LACTATED RINGERS IV SOLN: INTRAVENOUS | Status: DC

## 2011-12-14 MED ORDER — KETOROLAC TROMETHAMINE 60 MG/2ML IM SOLN
INTRAMUSCULAR | Status: DC | PRN
  Administered 2011-12-14: 30 mg via INTRAMUSCULAR

## 2011-12-14 MED ORDER — MEPERIDINE HCL 25 MG/ML IJ SOLN: 6.2500 mg | INTRAMUSCULAR | Status: DC | PRN

## 2011-12-14 MED ORDER — FENTANYL CITRATE 0.05 MG/ML IJ SOLN
INTRAMUSCULAR | Status: DC | PRN
  Administered 2011-12-14: 100 ug via INTRAVENOUS
  Administered 2011-12-14 (×2): 50 ug via INTRAVENOUS

## 2011-12-14 MED ORDER — FENTANYL CITRATE 0.05 MG/ML IJ SOLN
25.0000 ug | INTRAMUSCULAR | Status: DC | PRN
  Administered 2011-12-14 (×2): 50 ug via INTRAVENOUS

## 2011-12-14 MED ORDER — BUPIVACAINE HCL (PF) 0.5 % IJ SOLN
INTRAMUSCULAR | Status: AC
  Filled 2011-12-14: qty 30

## 2011-12-14 MED ORDER — OXYCODONE-ACETAMINOPHEN 5-325 MG PO TABS
1.0000 | ORAL_TABLET | Freq: Once | ORAL | Status: AC
  Administered 2011-12-14: 1 via ORAL

## 2011-12-14 MED ORDER — DEXAMETHASONE SODIUM PHOSPHATE 10 MG/ML IJ SOLN
INTRAMUSCULAR | Status: AC
  Filled 2011-12-14: qty 1

## 2011-12-14 MED ORDER — LACTATED RINGERS IV SOLN
INTRAVENOUS | Status: DC
  Administered 2011-12-14: 125 mL/h via INTRAVENOUS
  Administered 2011-12-14: 15:00:00 via INTRAVENOUS

## 2011-12-14 MED ORDER — IBUPROFEN 600 MG PO TABS: 600.0000 mg | ORAL_TABLET | Freq: Four times a day (QID) | ORAL | Status: DC | PRN

## 2011-12-14 MED ORDER — KETOROLAC TROMETHAMINE 30 MG/ML IJ SOLN: 15.0000 mg | Freq: Once | INTRAMUSCULAR | Status: DC | PRN

## 2011-12-14 MED ORDER — LIDOCAINE HCL (CARDIAC) 20 MG/ML IV SOLN
INTRAVENOUS | Status: DC | PRN
  Administered 2011-12-14: 50 mg via INTRAVENOUS

## 2011-12-14 MED ORDER — BUPIVACAINE HCL 0.5 % IJ SOLN
INTRAMUSCULAR | Status: DC | PRN
  Administered 2011-12-14: 30 mL

## 2011-12-14 MED ORDER — OXYCODONE HCL 10 MG PO TABS: 10.0000 mg | ORAL_TABLET | Freq: Four times a day (QID) | ORAL | Status: DC | PRN

## 2011-12-14 MED ORDER — ONDANSETRON HCL 4 MG/2ML IJ SOLN
INTRAMUSCULAR | Status: DC | PRN
  Administered 2011-12-14: 4 mg via INTRAVENOUS

## 2011-12-14 MED ORDER — LIDOCAINE HCL (CARDIAC) 20 MG/ML IV SOLN
INTRAVENOUS | Status: AC
  Filled 2011-12-14: qty 5

## 2011-12-14 MED ORDER — PROPOFOL 10 MG/ML IV EMUL
INTRAVENOUS | Status: DC | PRN
  Administered 2011-12-14: 50 mg via INTRAVENOUS
  Administered 2011-12-14: 200 mg via INTRAVENOUS

## 2011-12-14 MED ORDER — SODIUM CHLORIDE 0.9 % IR SOLN
Status: DC | PRN
  Administered 2011-12-14: 3000 mL

## 2011-12-14 MED ORDER — MIDAZOLAM HCL 2 MG/2ML IJ SOLN
INTRAMUSCULAR | Status: AC
  Filled 2011-12-14: qty 2

## 2011-12-14 MED ORDER — PROMETHAZINE HCL 25 MG/ML IJ SOLN: 6.2500 mg | INTRAMUSCULAR | Status: DC | PRN

## 2011-12-14 MED ORDER — DEXAMETHASONE SODIUM PHOSPHATE 10 MG/ML IJ SOLN
INTRAMUSCULAR | Status: DC | PRN
  Administered 2011-12-14: 10 mg via INTRAVENOUS

## 2011-12-14 MED ORDER — KETOROLAC TROMETHAMINE 60 MG/2ML IM SOLN
INTRAMUSCULAR | Status: AC
  Filled 2011-12-14: qty 2

## 2011-12-14 MED ORDER — KETOROLAC TROMETHAMINE 30 MG/ML IJ SOLN
INTRAMUSCULAR | Status: DC | PRN
  Administered 2011-12-14: 30 mg via INTRAVENOUS

## 2011-12-14 MED ORDER — MIDAZOLAM HCL 5 MG/5ML IJ SOLN
INTRAMUSCULAR | Status: DC | PRN
  Administered 2011-12-14: 2 mg via INTRAVENOUS

## 2011-12-14 MED ORDER — PROPOFOL 10 MG/ML IV EMUL
INTRAVENOUS | Status: AC
  Filled 2011-12-14: qty 20

## 2011-12-14 MED ORDER — ONDANSETRON HCL 4 MG/2ML IJ SOLN
INTRAMUSCULAR | Status: AC
  Filled 2011-12-14: qty 2

## 2011-12-14 SURGICAL SUPPLY — 19 items
CATH ROBINSON RED A/P 16FR (CATHETERS) ×2 IMPLANT
CLOTH BEACON ORANGE TIMEOUT ST (SAFETY) ×2 IMPLANT
CONTAINER PREFILL 10% NBF 60ML (FORM) ×4 IMPLANT
DECANTER SPIKE VIAL GLASS SM (MISCELLANEOUS) ×2 IMPLANT
DRAPE HYSTEROSCOPY (DRAPE) ×2 IMPLANT
GLOVE BIO SURGEON STRL SZ7 (GLOVE) ×4 IMPLANT
GLOVE BIOGEL PI IND STRL 7.0 (GLOVE) ×1 IMPLANT
GLOVE BIOGEL PI INDICATOR 7.0 (GLOVE) ×1
GLOVE ECLIPSE 6.5 STRL STRAW (GLOVE) ×2 IMPLANT
GLOVE SURG SS PI 7.0 STRL IVOR (GLOVE) ×2 IMPLANT
GOWN PREVENTION PLUS LG XLONG (DISPOSABLE) ×4 IMPLANT
GOWN STRL REIN XL XLG (GOWN DISPOSABLE) ×4 IMPLANT
NEEDLE SPNL 20GX3.5 QUINCKE YW (NEEDLE) ×2 IMPLANT
PACK VAGINAL MINOR WOMEN LF (CUSTOM PROCEDURE TRAY) ×2 IMPLANT
PAD OB MATERNITY 4.3X12.25 (PERSONAL CARE ITEMS) ×2 IMPLANT
SET GENESYS HTA PROCERVA (MISCELLANEOUS) ×2 IMPLANT
SYR CONTROL 10ML LL (SYRINGE) ×2 IMPLANT
TOWEL OR 17X24 6PK STRL BLUE (TOWEL DISPOSABLE) ×4 IMPLANT
WATER STERILE IRR 1000ML POUR (IV SOLUTION) ×2 IMPLANT

## 2011-12-14 NOTE — Transfer of Care (Signed)
Immediate Anesthesia Transfer of Care Note  Patient: [name]  Procedure(s) Performed: Procedure(s) (LRB): DILATATION & CURETTAGE/HYSTEROSCOPY WITH HYDROTHERMAL ABLATION (N/A)  Patient Location: PACU  Anesthesia Type: General  Level of Consciousness: sedated  Airway & Oxygen Therapy: Patient Spontanous Breathing and Patient connected to nasal cannula oxygen  Post-op Assessment: Report given to PACU RN  Post vital signs: Reviewed and stable  Complications: No apparent anesthesia complications

## 2011-12-14 NOTE — Anesthesia Preprocedure Evaluation (Addendum)
Anesthesia Evaluation  Patient identified by MRN, date of birth, ID band Patient awake    Reviewed: Allergy & Precautions, H&P , Patient's Chart, lab work & pertinent test results, reviewed documented beta blocker date and time   History of Anesthesia Complications Negative for: history of anesthetic complications  Airway Mallampati: III TM Distance: >3 FB Neck ROM: full    Dental No notable dental hx.    Pulmonary neg pulmonary ROS, asthma , Current Smoker,  breath sounds clear to auscultation  Pulmonary exam normal       Cardiovascular Exercise Tolerance: Good hypertension, negative cardio ROS  + Valvular Problems/Murmurs Rhythm:regular Rate:Normal     Neuro/Psych PSYCHIATRIC DISORDERS negative neurological ROS  negative psych ROS   GI/Hepatic negative GI ROS, Neg liver ROS, GERD-  Controlled,  Endo/Other  negative endocrine ROSMorbid obesity  Renal/GU negative Renal ROS     Musculoskeletal   Abdominal   Peds  Hematology negative hematology ROS (+)   Anesthesia Other Findings Hypertension     Anxiety        Panic attack     TMJ disease   can open mouth wide but jaw pops    Acid reflux     Asthma   no inhaler use    Heart murmur   developed during pregnancy- "leaky valve" Anemia        SVD (spontaneous vaginal delivery)   x 3 H/O endoscopy   checking for reflux    Reproductive/Obstetrics negative OB ROS                          Anesthesia Physical Anesthesia Plan  ASA: III  Anesthesia Plan: General LMA   Post-op Pain Management:    Induction:   Airway Management Planned:   Additional Equipment:   Intra-op Plan:   Post-operative Plan:   Informed Consent: I have reviewed the patients History and Physical, chart, labs and discussed the procedure including the risks, benefits and alternatives for the proposed anesthesia with the patient or authorized representative who has  indicated his/her understanding and acceptance.   Dental Advisory Given  Plan Discussed with: CRNA, Surgeon and Anesthesiologist  Anesthesia Plan Comments:         Anesthesia Quick Evaluation

## 2011-12-14 NOTE — Anesthesia Postprocedure Evaluation (Signed)
Anesthesia Post Note  Patient: [name]  Procedure(s) Performed: Procedure(s) (LRB): DILATATION & CURETTAGE/HYSTEROSCOPY WITH HYDROTHERMAL ABLATION (N/A)  Anesthesia type: GA  Patient location: PACU  Post pain: Pain level controlled  Post assessment: Post-op Vital signs reviewed  Last Vitals:  Filed Vitals:   12/14/11 1615  BP: 134/64  Pulse: 69  Temp: 35.9 C  Resp: 16    Post vital signs: Reviewed  Level of consciousness: sedated  Complications: No apparent anesthesia complications

## 2011-12-14 NOTE — Op Note (Signed)
PREOPERATIVE DIAGNOSIS:  Abnormal uterine bleeding POSTOPERATIVE DIAGNOSIS: The same PROCEDURE: Dilation and Curettage, Hysteroscopy, Hydrothermal Endometrial Ablation SURGEON:  Dr. Jaynie Collins  INDICATIONS: 41 y.o. [id number] here for scheduled surgery for abnormal uterine bleeding. Risks of surgery were discussed with the patient including but not limited to: bleeding which may require transfusion; infection which may require antibiotics; injury to uterus leading to risk of injury to surrounding intraperitoneal organs, burn injury to vagina or other organs, need for additional procedures including laparoscopy or laparotomy, and other postoperative/anesthesia complications. Patient was informed that there is a high likelihood of success of controlling her symptoms; however about 5% of patients may require further intervention.  Written informed consent was obtained.    FINDINGS:  A 11 week size uterus.  Diffuse proliferative endometrium.  Normal ostia bilaterally.  ANESTHESIA:   General, paracervical block with 30 ml of 0.5% Marcaine. INTRAVENOUS FLUIDS:  1200 ml of LR ESTIMATED BLOOD LOSS:  10 ml SPECIMENS: Endometrial curettings sent to pathology COMPLICATIONS:  None immediate.  PROCEDURE DETAILS:  The patient was then taken to the operating room where general anesthesia was administered and was found to be adequate.  After an adequate timeout was performed, she was placed in the dorsal lithotomy position and examined; then prepped and draped in the sterile manner.   Her bladder was catheterized for clear, yellow urine. A speculum was then placed in the patient's vagina and a single tooth tenaculum was applied to the anterior lip of the cervix.   A paracervical block using 30 ml of 0.5% Marcaine was administered.  The cervix was sounded to 11 cm and dilated manually with metal dilators to accommodate the hydrothermal ablation hysteroscopic apparatus.  Once the cervix was dilated, a sharp curettage  was then performed to obtain a small amount of endometrial curettings.  The hysteroscope was inserted under direct visualization using normal saline as a suspension medium.  The uterine cavity was carefully examined, both ostia were recognized, and diffusely proliferative endometrium was noted.   The hydrothermal ablation was then carried out as per protocol.   Complete ablation of the endometrium was observed and the hysteroscope was removed under direct visualization. .  No complications were observed.  The tenaculum was removed from the anterior lip of the cervix, and the vaginal speculum was removed after noting good hemostasis.  The patient tolerated the procedure well and was taken to the recovery area awake, extubated and in stable condition.  The patient will be discharged to home as per PACU criteria.  Routine postoperative instructions given.  She was prescribed Oxycodone, Ibuprofen and Colace.  She will follow up in the clinic on 01/04/12  for postoperative evaluation.

## 2011-12-14 NOTE — H&P (Signed)
Preoperative History and Physical  [name] is a 41 y.o. [id number] here for surgical management of endometritis and abnormal uterine bleeding. She is s/p Paragard IUD removal and has  been treated with antibiotics and pain medications. She has had several visits to the emergency department in the last couple of weeks for abdominal pain in upper and lower parts of her abdomen. She is also status post 2 recent surgeries. On 10/05/11 she underwent a laparoscopic appendectomy by Dr. Daphine Deutscher. The pathology report confirmed acute appendicitis but no other abnormality. She was discharged home on postoperative day #1. She was readmitted on 10/12/11 with ongoing right-sided abdominal pain. She underwent an ultrasound which showed a stone lodged in the neck of her gallbladder with some gallbladder wall thickening. I met her briefly before performing a laparoscopic cholecystectomy with intraoperative cholangiogram on 10/12/11. There were some adhesions to the gallbladder. The pathology confirmed chronic calculus cholecystitis. The patient was discharged home on 10/13/11.  She had a pelvic ultrasound that showed small 1.9 cm R ovarian cyst, and small myometrial cysts concerning for endometritis.  The following portions of the patient's history were reviewed and updated as appropriate: allergies, current medications, past family history, past medical history, past social history, past surgical history and problem list.   Proposed surgery: Hysteroscopy, D&C, hydrothermal ablation  PMH:    Past Medical History  Diagnosis Date  . Hypertension   . Anxiety   . Panic attack   . TMJ disease     can open mouth wide but jaw pops  . Acid reflux   . Asthma     no inhaler use  . Heart murmur     developed during pregnancy- "leaky valve"  . Anemia   . SVD (spontaneous vaginal delivery)     x 3  . H/O endoscopy     checking for reflux   PSH:     Past Surgical History  Procedure Date  . Eye surgery   . Dilitation  and curettage   . Ankle surgery   . Laparoscopic appendectomy 10/05/2011    Procedure: APPENDECTOMY LAPAROSCOPIC;  Surgeon: Valarie Merino, MD;  Location: [facility] OR;  Service: General;  Laterality: N/A;  . Cholecystectomy 10/12/2011    Procedure: LAPAROSCOPIC CHOLECYSTECTOMY WITH INTRAOPERATIVE CHOLANGIOGRAM;  Surgeon: Wilmon Arms. Corliss Skains, MD;  Location: [facility] OR;  Service: General;  Laterality: N/A;  . Appendectomy   . Dilation and curettage of uterus   . Wisdom tooth extraction    POb/GynH:   OB History    Grav Para Term Preterm Abortions TAB SAB Ect Mult Living   5 3 3  2  2   3     Patient denies any recent cervical dysplasia or STIs.  Medications:  Prescriptions prior to admission  Medication Sig Dispense Refill  . ALPRAZolam (XANAX) 1 MG tablet Take 1 mg by mouth 3 (three) times daily as needed. For anxiety      . famotidine (PEPCID) 20 MG tablet Take 2 tablets (40 mg total) by mouth 2 (two) times daily.  30 tablet  0  . ibuprofen (ADVIL,MOTRIN) 200 MG tablet Take 400 mg by mouth every 8 (eight) hours as needed. For pain      . lisinopril (PRINIVIL,ZESTRIL) 10 MG tablet Take 10 mg by mouth daily.       . Oxycodone HCl 10 MG TABS Take 1 tablet (10 mg total) by mouth every 6 (six) hours as needed.  40 tablet  0  . promethazine (PHENERGAN) 25  MG tablet Take 25 mg by mouth every 6 (six) hours as needed. For nausea      . zolpidem (AMBIEN) 5 MG tablet Take 1-2 tablets (5-10 mg total) by mouth at bedtime as needed for sleep.  30 tablet  3  . metoCLOPramide (REGLAN) 10 MG tablet Take 1 tablet (10 mg total) by mouth 4 (four) times daily.  50 tablet  2    Allergies:  Allergies  Allergen Reactions  . Morphine And Related Hives and Shortness Of Breath    Severe chest pain, tolerates percocet/Dilaudid  . Paroxetine Other (See Comments)    REACTION: delerium and panic reaction Pt states she is allergic to all antidepressants   SH:   History  Substance Use Topics  . Smoking status: Current  Everyday Smoker -- 1.0 packs/day for 9 years    Types: Cigarettes  . Smokeless tobacco: Never Used  . Alcohol Use: No   FH:    Family History  Problem Relation Age of Onset  . Hypertension Other    Review of Systems: Noncontributory  PHYSICAL EXAM: Blood pressure 147/96, pulse 75, temperature 98.8 F (37.1 C), temperature source Oral, resp. rate 18, SpO2 100.00%. Chest - clear to auscultation, no wheezes, rales or rhonchi, symmetric air entry Heart - normal rate and regular rhythm Abdomen - soft, nontender, nondistended, no masses or organomegaly Pelvic - examination not indicated Extremities - peripheral pulses normal, no pedal edema, no clubbing or cyanosis  Labs: Recent Results (from the past 336 hour(s))  BASIC METABOLIC PANEL   Collection Time   12/07/11 10:20 AM      Component Value Range   Sodium 138  135 - 145 mEq/L   Potassium 3.9  3.5 - 5.1 mEq/L   Chloride 102  96 - 112 mEq/L   CO2 29  19 - 32 mEq/L   Glucose, Bld 103 (*) 70 - 99 mg/dL   BUN 7  6 - 23 mg/dL   Creatinine, Ser 4.09  0.50 - 1.10 mg/dL   Calcium 9.2  8.4 - 81.1 mg/dL   GFR calc non Af Amer >90  >90 mL/min   GFR calc Af Amer >90  >90 mL/min  CBC   Collection Time   12/07/11  3:25 PM      Component Value Range   WBC 7.1  4.0 - 10.5 K/uL   RBC 4.19  3.87 - 5.11 MIL/uL   Hemoglobin 10.2 (*) 12.0 - 15.0 g/dL   HCT 91.4 (*) 78.2 - 95.6 %   MCV 80.4  78.0 - 100.0 fL   MCH 24.3 (*) 26.0 - 34.0 pg   MCHC 30.3  30.0 - 36.0 g/dL   RDW 21.3 (*) 08.6 - 57.8 %   Platelets 367  150 - 400 K/uL  PREGNANCY, URINE   Collection Time   12/14/11  1:38 PM      Component Value Range   Preg Test, Ur NEGATIVE  NEGATIVE   11/06/11 Endometrium, biopsy: DEGENERATING SECRETORY-TYPE ENDOMETRIUM.  NEGATIVE FOR HYPERPLASIA OR MALIGNANCY. BENIGN ENDOCERVICAL MUCOSA.  Imaging Studies: 10/18/2011 TRANSABDOMINAL AND TRANSVAGINAL ULTRASOUND OF PELVIS Clinical Data: Follow-up CT scan. Comparison: CT scan, same date. Findings:  Uterus: Measures 11.1 x 5.4 x 6.5 cm. IUD is noted in the endometrial canal and appears to be normally located. Two small cystic appearing areas in the myometrium correlate with the CT findings and could be secondary to endometritis or prominent endometrial glands. Endometrium: Normal in thickness. A small amount of fluid noted in the  endocervical canal. Right ovary: Measures 3.6 x 3.2 x 3.8 cm. Small cysts are noted. Left ovary: Measures 2.1 x 1.8 x 1.8 cm. No cysts or masses. Other findings: No free fluid IMPRESSION: 1. Cystic areas in the myometrium correlate with the CT findings and could be due to endometritis or complex endometrial glands. 2. 1.9 x 1.7 x 1.4 cyst associated with the right ovary. 3. Small amount of fluid in the endocervical canal. Original Report Authenticated By: P. Loralie Champagne, M.D.  10/18/2011 CT ABDOMEN AND PELVIS WITH CONTRAST Clinical Data: Status post appendectomy 13 days ago. Status post cholecystectomy 6 days ago. Nausea and vomiting. Comparison: CT abdomen and pelvis 10/05/2011. Findings: Low lung bases are clear. No pleural or pericardial effusion. Cholecystectomy clips are noted. There is no fluid in the gallbladder fossa. The biliary tree is unremarkable. The liver, spleen, adrenal glands, pancreas and kidneys appear normal. Postoperative change of appendectomy is noted. No abscess or other complicating feature is identified. The stomach and small and large bowel appear normal. There is new soft tissue fullness in the right adnexa. Also seen are two new cystic lesions within the myometrium in the fundus of the uterus. One of these has a septation. On the prior study, the patient's inflamed appendix was nearly contiguous with the right ovary. Left adnexa is unremarkable. No lymphadenopathy is identified. IMPRESSION: 1. New soft tissue fullness in the right adnexa with two new cystic lesions in the myometrium of the fundus of the uterus may be due to infectious or inflammatory  process related to appendicitis. Recommend correlation with physical examination. Pelvic ultrasound could also be used for further evaluation. 2. Status post cholecystectomy without evidence of complication. Original Report Authenticated By: Bernadene Bell. Maricela Curet, M.D.   Assessment: Patient Active Problem List  Diagnosis  . MORBID OBESITY  . ANEMIA, IRON DEFICIENCY, UNSPEC.  [name] ANXIETY  . PANIC ATTACKS  . OBSESSIVE COMPUL. DISORDER  . HYPERTENSION, BENIGN SYSTEMIC  . GASTROESOPHAGEAL REFLUX, NO ESOPHAGITIS  . SYSTOLIC MURMUR  . Appendicitis, acute  . Chronic calculus cholecystitis  . Menorrhagia  . Endometritis    Plan: Patient desires surgical management with hysteroscopy, D&C, hydrothermal ablation . The risks of surgery were discussed in detail with the patient including but not limited to: bleeding which may require transfusion or reoperation; infection which may require prolonged hospitalization or re-hospitalization and antibiotic therapy; injury to bowel, bladder, ureters and major vessels or other surrounding organs; need for additional procedures including laparoscopy; and other postoperative or anesthesia complications. Patient was told that the likelihood that her condition and symptoms will be treated effectively with this surgical management was very high; the postoperative expectations were also discussed in detail. The patient also understands the alternative treatment options which were discussed in full. All questions were answered. Likelihood of success in alleviating the patient's symptoms was discussed. Routine postoperative instructions will be reviewed with the patient and her family in detail after surgery.  The patient concurred with the proposed plan, giving informed written consent for the surgery.  Patient has been NPO since last night she will remain NPO for procedure.  Anesthesia and OR aware.  Preoperative SCDs ordered on call to the OR.  To OR when ready.  Jaynie Collins, M.D. 12/14/2011 2:25 PM

## 2011-12-20 ENCOUNTER — Telehealth (INDEPENDENT_AMBULATORY_CARE_PROVIDER_SITE_OTHER): Payer: Self-pay | Admitting: General Surgery

## 2011-12-20 ENCOUNTER — Emergency Department (HOSPITAL_COMMUNITY)
Admission: EM | Admit: 2011-12-20 | Discharge: 2011-12-20 | Disposition: A | Discharge status: Home / Self Care | Payer: Medicaid Other | Attending: Emergency Medicine | Admitting: Emergency Medicine

## 2011-12-20 ENCOUNTER — Emergency Department (HOSPITAL_COMMUNITY): Payer: Medicaid Other

## 2011-12-20 ENCOUNTER — Encounter (HOSPITAL_COMMUNITY): Payer: Self-pay | Admitting: Emergency Medicine

## 2011-12-20 DIAGNOSIS — R1013 Epigastric pain: Secondary | ICD-10-CM | POA: Insufficient documentation

## 2011-12-20 DIAGNOSIS — F410 Panic disorder [episodic paroxysmal anxiety] without agoraphobia: Secondary | ICD-10-CM | POA: Insufficient documentation

## 2011-12-20 DIAGNOSIS — I10 Essential (primary) hypertension: Secondary | ICD-10-CM | POA: Insufficient documentation

## 2011-12-20 DIAGNOSIS — F17200 Nicotine dependence, unspecified, uncomplicated: Secondary | ICD-10-CM | POA: Insufficient documentation

## 2011-12-20 DIAGNOSIS — Z9089 Acquired absence of other organs: Secondary | ICD-10-CM | POA: Insufficient documentation

## 2011-12-20 DIAGNOSIS — R109 Unspecified abdominal pain: Secondary | ICD-10-CM

## 2011-12-20 DIAGNOSIS — J45909 Unspecified asthma, uncomplicated: Secondary | ICD-10-CM | POA: Insufficient documentation

## 2011-12-20 DIAGNOSIS — K219 Gastro-esophageal reflux disease without esophagitis: Secondary | ICD-10-CM | POA: Insufficient documentation

## 2011-12-20 LAB — COMPREHENSIVE METABOLIC PANEL
ALT: 16 U/L (ref 0–35)
AST: 16 U/L (ref 0–37)
Calcium: 9.5 mg/dL (ref 8.4–10.5)
Creatinine, Ser: 0.58 mg/dL (ref 0.50–1.10)
Sodium: 140 mEq/L (ref 135–145)
Total Protein: 7.5 g/dL (ref 6.0–8.3)

## 2011-12-20 LAB — CBC WITH DIFFERENTIAL/PLATELET
Basophils Absolute: 0 10*3/uL (ref 0.0–0.1)
Basophils Relative: 0 % (ref 0–1)
Eosinophils Absolute: 0.3 10*3/uL (ref 0.0–0.7)
Eosinophils Relative: 4 % (ref 0–5)
HCT: 37.2 % (ref 36.0–46.0)
MCH: 24.3 pg — ABNORMAL LOW (ref 26.0–34.0)
MCHC: 30.9 g/dL (ref 30.0–36.0)
MCV: 78.5 fL (ref 78.0–100.0)
Monocytes Absolute: 0.5 10*3/uL (ref 0.1–1.0)
Platelets: 353 10*3/uL (ref 150–400)
RDW: 15.6 % — ABNORMAL HIGH (ref 11.5–15.5)
WBC: 8.4 10*3/uL (ref 4.0–10.5)

## 2011-12-20 LAB — URINALYSIS, ROUTINE W REFLEX MICROSCOPIC
Bilirubin Urine: NEGATIVE
Ketones, ur: NEGATIVE mg/dL
Leukocytes, UA: NEGATIVE
Nitrite: NEGATIVE
Protein, ur: NEGATIVE mg/dL
Urobilinogen, UA: 0.2 mg/dL (ref 0.0–1.0)
pH: 8 (ref 5.0–8.0)

## 2011-12-20 MED ORDER — SUCRALFATE 1 G PO TABS: 1.0000 g | ORAL_TABLET | Freq: Four times a day (QID) | ORAL | Status: DC

## 2011-12-20 NOTE — ED Provider Notes (Signed)
History     CSN: [id number]  Arrival date & time 12/20/11  1516   First MD Initiated Contact with Patient 12/20/11 1710      Chief Complaint  Patient presents with  . Abdominal Pain    (Consider location/radiation/quality/duration/timing/severity/associated sxs/prior treatment) Patient is a 41 y.o. female presenting with abdominal pain. The history is provided by the patient.  Abdominal Pain The primary symptoms of the illness include abdominal pain.   patient here complaining of chronic abdominal pain x2 months since having her cholecystectomy and appendectomy. Pain is localized in the epigastric area and is worse with eating and drinking liquids. Denies any severe weight loss or blood in her stools. Is currently taking medications for this without relief. Pain has been constant in nature.  Past Medical History  Diagnosis Date  . Hypertension   . Anxiety   . Panic attack   . TMJ disease     can open mouth wide but jaw pops  . Acid reflux   . Asthma     no inhaler use  . Heart murmur     developed during pregnancy- "leaky valve"  . Anemia   . SVD (spontaneous vaginal delivery)     x 3  . H/O endoscopy     checking for reflux    Past Surgical History  Procedure Date  . Eye surgery   . Dilitation and curettage   . Ankle surgery   . Laparoscopic appendectomy 10/05/2011    Procedure: APPENDECTOMY LAPAROSCOPIC;  Surgeon: Valarie Merino, MD;  Location: [facility] OR;  Service: General;  Laterality: N/A;  . Cholecystectomy 10/12/2011    Procedure: LAPAROSCOPIC CHOLECYSTECTOMY WITH INTRAOPERATIVE CHOLANGIOGRAM;  Surgeon: Wilmon Arms. Corliss Skains, MD;  Location: [facility] OR;  Service: General;  Laterality: N/A;  . Appendectomy   . Dilation and curettage of uterus   . Wisdom tooth extraction   . Laser ablation     Family History  Problem Relation Age of Onset  . Hypertension Other     History  Substance Use Topics  . Smoking status: Current Everyday Smoker -- 1.0 packs/day for 9 years   Types: Cigarettes  . Smokeless tobacco: Never Used  . Alcohol Use: No    OB History    Grav Para Term Preterm Abortions TAB SAB Ect Mult Living   5 3 3  2  2   3       Review of Systems  Gastrointestinal: Positive for abdominal pain.  All other systems reviewed and are negative.    Allergies  Morphine and related and Paroxetine  Home Medications   Current Outpatient Rx  Name Route Sig Dispense Refill  . ALPRAZOLAM 1 MG PO TABS Oral Take 1 mg by mouth 3 (three) times daily as needed. For anxiety    . FAMOTIDINE 20 MG PO TABS Oral Take 2 tablets (40 mg total) by mouth 2 (two) times daily. 30 tablet 0  . IBUPROFEN 200 MG PO TABS Oral Take 400 mg by mouth every 8 (eight) hours as needed. For pain    . LISINOPRIL 10 MG PO TABS Oral Take 10 mg by mouth daily.       BP 139/85  Pulse 84  Temp 99.4 F (37.4 C) (Oral)  Resp 18  SpO2 98%  LMP 11/23/2011  Physical Exam  Nursing note and vitals reviewed. Constitutional: She is oriented to person, place, and time. She appears well-developed and well-nourished.  Non-toxic appearance. No distress.  HENT:  Head: Normocephalic and atraumatic.  Eyes: Conjunctivae, EOM and lids are normal. Pupils are equal, round, and reactive to light.  Neck: Normal range of motion. Neck supple. No tracheal deviation present. No mass present.  Cardiovascular: Normal rate, regular rhythm and normal heart sounds.  Exam reveals no gallop.   No murmur heard. Pulmonary/Chest: Effort normal and breath sounds normal. No stridor. No respiratory distress. She has no decreased breath sounds. She has no wheezes. She has no rhonchi. She has no rales.  Abdominal: Soft. Normal appearance and bowel sounds are normal. She exhibits no distension. There is no tenderness. There is no rebound and no CVA tenderness.  Musculoskeletal: Normal range of motion. She exhibits no edema and no tenderness.  Neurological: She is alert and oriented to person, place, and time. She has  normal strength. No cranial nerve deficit or sensory deficit. GCS eye subscore is 4. GCS verbal subscore is 5. GCS motor subscore is 6.  Skin: Skin is warm and dry. No abrasion and no rash noted.  Psychiatric: She has a normal mood and affect. Her speech is normal and behavior is normal.    ED Course  Procedures (including critical care time)  Labs Reviewed  CBC WITH DIFFERENTIAL - Abnormal; Notable for the following:    Hemoglobin 11.5 (*)     MCH 24.3 (*)     RDW 15.6 (*)     All other components within normal limits  COMPREHENSIVE METABOLIC PANEL - Abnormal; Notable for the following:    Glucose, Bld 100 (*)     BUN 4 (*)     All other components within normal limits  URINALYSIS, ROUTINE W REFLEX MICROSCOPIC - Abnormal; Notable for the following:    APPearance HAZY (*)     Specific Gravity, Urine 1.004 (*)     All other components within normal limits  POCT I-STAT TROPONIN I   Dg Chest 2 View  12/20/2011  *RADIOLOGY REPORT*  Clinical Data: Abdominal pain  CHEST - 2 VIEW  Comparison: 10/18/2011  Findings: Heart size and mediastinal contours are normal.  No pleural effusion or edema.  No airspace consolidation.  Coarsened interstitial markings appears similar to previous exam.  The visualized osseous structures appear unremarkable.  IMPRESSION:  1.  No acute cardiopulmonary abnormalities. 2.  Chronic interstitial coarsening.  Original Report Authenticated By: Rosealee Albee, M.D.     No diagnosis found.    MDM  The patient has long-standing history of abdominal pain. Has been seen by her Dr. for same. Patient does not have insurance and therefore is unable to have GI followup. She does go to [facility] and has no prominent to be seen by them in 3 days. She will continue taking her PPI and Pepcid. I will add Carafate. Abdominal exam is nonsurgical at this time and she exhibits for discharge        Toy Baker, MD 12/20/11 1733

## 2011-12-20 NOTE — ED Notes (Signed)
Pt ambulated to the bathroom, pt did not return to her room, this RN went the bathroom to check on pt and pt had already left the bathroom and department, this RN is unable to go over d/c instructions or give pt her d/c prescription

## 2011-12-20 NOTE — ED Notes (Signed)
MD at bedside - Dr. Allen 

## 2011-12-20 NOTE — ED Notes (Signed)
Pt reports having her gall bladder removed 09/11/2011, pt reports constant mid abd pain radiating to upper abd, pt reports unable to eat or drink d/t causing increase pain, pt also reports hx of "pleurisy," pt reports waking up in the middle of the night w/a "pleurisy attack" coughing up whitish/grayish material.

## 2011-12-20 NOTE — Telephone Encounter (Signed)
Called with complaints of abdominal pain and inability to eat due to abdominal pain and chest "spasms". She says that this has been going on for 2 months and it has not changed much but she has had some difficulty urinating.  She says that she is not vomiting  And is keeping food down but she has pain after eating and her abdomen is distended.  It sounds as though none of these complaints are new but I am not sure what to offer her.   I recommended that she call her surgeon tomorrow to discuss the next step in her workup but she says that her husband works [employer]vertising account executive and she wont be able to go in for evaluation.  I explained that over the weekend her only option for evaluation would be through the ER.  She said that she would go to the ER for evaluation.

## 2011-12-20 NOTE — ED Notes (Addendum)
Pt reports had appendix and gallbladder removed in May. Pt reports continues to have abdominal pain and unable to eat or drink d/t causing more pain. Pt reports being incontinent of urine and sweating with bowel movements. Pt also reports coughing up whitish/gray material. Pt has pictures of what she has been coughing up. Pt reports syncopal episode last week. Pt also reports chest pain x 3 days that radiates into right arm and right side of jaw.

## 2011-12-22 ENCOUNTER — Encounter (INDEPENDENT_AMBULATORY_CARE_PROVIDER_SITE_OTHER): Payer: Self-pay | Admitting: Surgery

## 2011-12-23 ENCOUNTER — Encounter (INDEPENDENT_AMBULATORY_CARE_PROVIDER_SITE_OTHER): Payer: Self-pay | Admitting: Surgery

## 2011-12-25 ENCOUNTER — Telehealth: Payer: Self-pay | Admitting: *Deleted

## 2011-12-25 NOTE — Telephone Encounter (Signed)
Pt had endometrial ablation last week. [place] beach [place] and has started having some bad cramps. She is not bleeding. She is taking ibuprofen but it isn't helping. Wanted to know if we can call her in something stronger to the pharmacy. [facility].

## 2011-12-25 NOTE — Telephone Encounter (Signed)
Called pt and discussed her concerns. She states that after the surgery she has been having cramping but no bleeding. The cramping has lessened somewhat since the first few days after the procedure, but is still there. She states she has been taking ibuprofen 600 mg every 6hrs and while it does help, the pain is not completely relieved. I consulted w/Dr. Jolayne Panther and she stated that additional pain meds will not be provided without an evaluation as pt had procedure almost 2 wks ago.  Pt advised of this information. Pt also advised that she may take tylenol between doses of ibuprofen. She should go to an Urgent Care facility or ED if her sx worsen or if she has sx of fever. Curently, there are no available appts for next week. She may call back next week to check for cancellations and schedule appt @ clinic if sx persist. Pt also stated that Dr. Macon Large had previously prescribed Ambien and asked if her Rx for Ambien could be refilled. I again stated that she would need to be seen @ clinic before consideration for Ambien refill could be made. I noticed that pt takes Xanax as well and asked if this would help her sleep. Pt replied "no".   Pt voiced understanding of all instructions and information and had no further questions

## 2011-12-29 ENCOUNTER — Emergency Department (HOSPITAL_COMMUNITY)
Admission: EM | Admit: 2011-12-29 | Discharge: 2011-12-29 | Disposition: A | Discharge status: Home / Self Care | Payer: Medicaid Other | Attending: Emergency Medicine | Admitting: Emergency Medicine

## 2011-12-29 ENCOUNTER — Encounter (HOSPITAL_COMMUNITY): Payer: Self-pay | Admitting: Emergency Medicine

## 2011-12-29 ENCOUNTER — Telehealth (INDEPENDENT_AMBULATORY_CARE_PROVIDER_SITE_OTHER): Payer: Self-pay | Admitting: General Surgery

## 2011-12-29 DIAGNOSIS — F411 Generalized anxiety disorder: Secondary | ICD-10-CM | POA: Insufficient documentation

## 2011-12-29 DIAGNOSIS — J45909 Unspecified asthma, uncomplicated: Secondary | ICD-10-CM | POA: Insufficient documentation

## 2011-12-29 DIAGNOSIS — I10 Essential (primary) hypertension: Secondary | ICD-10-CM | POA: Insufficient documentation

## 2011-12-29 DIAGNOSIS — T23102A Burn of first degree of left hand, unspecified site, initial encounter: Secondary | ICD-10-CM

## 2011-12-29 DIAGNOSIS — X12XXXA Contact with other hot fluids, initial encounter: Secondary | ICD-10-CM | POA: Insufficient documentation

## 2011-12-29 DIAGNOSIS — K219 Gastro-esophageal reflux disease without esophagitis: Secondary | ICD-10-CM | POA: Insufficient documentation

## 2011-12-29 DIAGNOSIS — F17200 Nicotine dependence, unspecified, uncomplicated: Secondary | ICD-10-CM | POA: Insufficient documentation

## 2011-12-29 DIAGNOSIS — T23169A Burn of first degree of back of unspecified hand, initial encounter: Secondary | ICD-10-CM | POA: Insufficient documentation

## 2011-12-29 MED ORDER — TRAMADOL HCL 50 MG PO TABS
50.0000 mg | ORAL_TABLET | Freq: Once | ORAL | Status: AC
  Administered 2011-12-29: 50 mg via ORAL
  Filled 2011-12-29: qty 1

## 2011-12-29 MED ORDER — TRAMADOL HCL 50 MG PO TABS: 50.0000 mg | ORAL_TABLET | Freq: Four times a day (QID) | ORAL | Status: AC | PRN

## 2011-12-29 MED ORDER — SILVER SULFADIAZINE 1 % EX CREA
TOPICAL_CREAM | Freq: Two times a day (BID) | CUTANEOUS | Status: DC
  Administered 2011-12-29: 18:00:00 via TOPICAL
  Filled 2011-12-29: qty 50
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
## 2011-12-29 NOTE — Telephone Encounter (Signed)
Pt called to reschedule appt missed yesterday because he did not have her Medicaid card.  She now has it and the co-pay needed, so scheduled appt with Dr. Corliss Skains for 12/30/11.  She states she has near constant pain, nausea, watery diarrhea which is now becoming bloody.

## 2011-12-29 NOTE — ED Notes (Addendum)
Splashed hot water from kettle from making tea this afternoon. Redness -- To Ed via [facility]  Cool saline bandage per EMS

## 2011-12-29 NOTE — ED Provider Notes (Signed)
History     CSN: [id number]  Arrival date & time 12/29/11  1629   First MD Initiated Contact with Patient 12/29/11 1748      Chief Complaint  Patient presents with  . burns on left hand     (Consider location/radiation/quality/duration/timing/severity/associated sxs/prior treatment) HPI  41 year old female with history of anxiety, and panic attack presents for evaluations burn. Patient accidentally splashed hot water on L hand while making tea this afternoon.  C/o burning sensation to L hand.  Denies any other injury. No fever, numbness, blisters or bleeding.  Presents to ED via EMS.     Past Medical History  Diagnosis Date  . Hypertension   . Anxiety   . Panic attack   . TMJ disease     can open mouth wide but jaw pops  . Acid reflux   . Asthma     no inhaler use  . Heart murmur     developed during pregnancy- "leaky valve"  . Anemia   . SVD (spontaneous vaginal delivery)     x 3  . H/O endoscopy     checking for reflux    Past Surgical History  Procedure Date  . Eye surgery   . Dilitation and curettage   . Ankle surgery   . Laparoscopic appendectomy 10/05/2011    Procedure: APPENDECTOMY LAPAROSCOPIC;  Surgeon: Valarie Merino, MD;  Location: [facility] OR;  Service: General;  Laterality: N/A;  . Cholecystectomy 10/12/2011    Procedure: LAPAROSCOPIC CHOLECYSTECTOMY WITH INTRAOPERATIVE CHOLANGIOGRAM;  Surgeon: Wilmon Arms. Corliss Skains, MD;  Location: [facility] OR;  Service: General;  Laterality: N/A;  . Appendectomy   . Dilation and curettage of uterus   . Wisdom tooth extraction   . Laser ablation     Family History  Problem Relation Age of Onset  . Hypertension Other     History  Substance Use Topics  . Smoking status: Current Everyday Smoker -- 1.0 packs/day for 9 years    Types: Cigarettes  . Smokeless tobacco: Never Used  . Alcohol Use: No    OB History    Grav Para Term Preterm Abortions TAB SAB Ect Mult Living   5 3 3  2  2   3       Review of Systems    Constitutional: Negative for fever.  Skin: Negative for rash.  Neurological: Negative for numbness.  All other systems reviewed and are negative.    Allergies  Morphine and related and Paroxetine  Home Medications   Current Outpatient Rx  Name Route Sig Dispense Refill  . ALPRAZOLAM 1 MG PO TABS Oral Take 1 mg by mouth 3 (three) times daily as needed. For anxiety    . FAMOTIDINE 20 MG PO TABS Oral Take 2 tablets (40 mg total) by mouth 2 (two) times daily. 30 tablet 0  . IBUPROFEN 200 MG PO TABS Oral Take 400 mg by mouth every 8 (eight) hours as needed. For pain    . LISINOPRIL 10 MG PO TABS Oral Take 10 mg by mouth daily.     . SUCRALFATE 1 G PO TABS Oral Take 1 tablet (1 g total) by mouth 4 (four) times daily. 30 tablet 0    BP 131/71  Pulse 80  Temp 99.3 F (37.4 C) (Oral)  Resp 16  SpO2 100%  LMP 11/23/2011  Physical Exam  Nursing note and vitals reviewed. Constitutional: She is oriented to person, place, and time. She appears well-developed and well-nourished. No distress.  HENT:  Head: Atraumatic.  Neck: Neck supple.  Neurological: She is alert and oriented to person, place, and time.  Skin: Skin is warm.       1st degree burn to dorsum of L hand.  No blisters, no rash.  Ttp.    Psychiatric: She has a normal mood and affect.    ED Course  Procedures (including critical care time)  Labs Reviewed - No data to display No results found.   No diagnosis found.  1. First degree burn to L hand  MDM  Burn L hand with hot water.  1st degree.  Will give pain medication and silvadene.  Care instruction given.  <1% surface area burn.  Pt report only allergic to pure morphine but ok with derivative.  Will prescribe Lucile Crater, PA-C 12/29/11 1759  Fayrene Helper, PA-C 12/29/11 0865

## 2011-12-29 NOTE — ED Provider Notes (Signed)
Medical screening examination/treatment/procedure(s) were performed by non-physician practitioner and as supervising physician I was immediately available for consultation/collaboration.  Toy Baker, MD 12/29/11 [phone]

## 2011-12-30 ENCOUNTER — Encounter (INDEPENDENT_AMBULATORY_CARE_PROVIDER_SITE_OTHER): Payer: Self-pay | Admitting: Surgery

## 2011-12-30 ENCOUNTER — Ambulatory Visit (INDEPENDENT_AMBULATORY_CARE_PROVIDER_SITE_OTHER): Payer: Medicaid Other | Admitting: Surgery

## 2011-12-30 ENCOUNTER — Other Ambulatory Visit (INDEPENDENT_AMBULATORY_CARE_PROVIDER_SITE_OTHER): Payer: Self-pay | Admitting: Surgery

## 2011-12-30 VITALS — BP 120/82 | HR 78 | Temp 97.8°F | Resp 18 | Ht 67.0 in | Wt 282.0 lb

## 2011-12-30 DIAGNOSIS — R197 Diarrhea, unspecified: Secondary | ICD-10-CM

## 2011-12-30 DIAGNOSIS — R112 Nausea with vomiting, unspecified: Secondary | ICD-10-CM | POA: Insufficient documentation

## 2011-12-30 MED ORDER — OXYCODONE-ACETAMINOPHEN 5-325 MG PO TABS: 1.0000 | ORAL_TABLET | ORAL | Status: AC | PRN

## 2011-12-30 MED ORDER — PROMETHAZINE HCL 12.5 MG PO TABS: 12.5000 mg | ORAL_TABLET | Freq: Four times a day (QID) | ORAL | Status: AC | PRN

## 2011-12-30 MED ORDER — LANSOPRAZOLE 30 MG PO CPDR: 30.0000 mg | DELAYED_RELEASE_CAPSULE | Freq: Every day | ORAL | Status: DC

## 2011-12-30 NOTE — Progress Notes (Signed)
This is a 41 year old female who is status post three recent surgeries. On 10/05/11, she underwent a laparoscopic appendectomy by Dr. Daphine Deutscher. The pathology report confirmed acute appendicitis but no other abnormality. She was discharged home on postoperative day #1. She was readmitted on 10/12/11 with ongoing right-sided abdominal pain. She underwent an ultrasound which showed a stone lodged in the neck of her gallbladder with some gallbladder wall thickening. I met her briefly before performing a laparoscopic cholecystectomy with intraoperative cholangiogram on 10/12/11. There were some adhesions to the gallbladder. The pathology confirmed chronic calculus cholecystitis. The patient was discharged home on 5/28.  In July, she underwent endometrial ablation which has stopped her chronic vaginal bleeding.   She continues to complain of significant abdominal symptoms. She has constant pain in her epigastrium radiating down towards her umbilicus. The pain occasionally constant sharp waves. It seems to get worse with eating and drinking and is associated with nausea and vomiting. She is very poor appetite. She has also developed copious watery diarrhea. Occasionally she sees some blood in her bowel movements. She has had some stress incontinence of diarrhea when she coughs. She has very low and her drain and is becoming depressed because of the chronic symptoms. She has been taking the Prevacid that I prescribed her last time. She has undergone a CT scan which was unremarkable. Liver function tests were normal.  Filed Vitals:   12/30/11 1607  BP: 120/82  Pulse: 78  Temp: 97.8 F (36.6 C)  Resp: 18    Obese female  - appears uncomfortable Sclera - anicteric; Lungs - CTA B CV - RRR Abd - non-distended; obese; mildly tender in epigastrium radiating down towards umbilicus;  Surgical incisions healed with no sign of infection No lower abdominal tenderness   Lab Results  Component Value Date   WBC 8.4  12/20/2011   HGB 11.5* 12/20/2011   HCT 37.2 12/20/2011   MCV 78.5 12/20/2011   PLT 353 12/20/2011   Lab Results  Component Value Date   CREATININE 0.58 12/20/2011   BUN 4* 12/20/2011   NA 140 12/20/2011   K 3.9 12/20/2011   CL 105 12/20/2011   CO2 25 12/20/2011   Lab Results  Component Value Date   ALT 16 12/20/2011   AST 16 12/20/2011   ALKPHOS 114 12/20/2011   BILITOT 0.3 12/20/2011    Imp:  Chronic epigastric abdominal pain with nausea, vomiting, diarrhea (occasionally bloody) - after lap appendectomy and cholecystectomy.  Unclear etiology.   Plan:  Refer to GI for EGD and colonoscopy Will manage symptoms with Prevacid, Percocet, and PRN Phenergan.  Try Imodium for diarrhea.    Wilmon Arms. Corliss Skains, MD, [facility]  12/30/2011 5:29 PM

## 2012-01-04 ENCOUNTER — Telehealth: Payer: Self-pay | Admitting: Medical

## 2012-01-04 ENCOUNTER — Ambulatory Visit: Payer: Self-pay | Admitting: Obstetrics & Gynecology

## 2012-01-04 NOTE — Telephone Encounter (Signed)
Patient called stating that her pharmacy should have sent a refill for Ambien. Would like to have that refill sent.

## 2012-01-04 NOTE — Telephone Encounter (Signed)
Reviewed the patient's chart with Dr. Macon Large. The patient was informed that we will no longer prescribe ambien for her from this clinic because this was a short term solution for difficulty sleeping before her surgery. The patient was advised that her PCP will have to refill the ambien if necessary. The patient stated that she has seen her PCP and he will not prescribe the ambien either. Discussed this situation with Dr. Macon Large and told the patient again that Dr. Macon Large will not prescribe the ambien as her insomnia is no longer related to a gyn issue. The patient said "ok" and hung up the phone.

## 2012-01-06 ENCOUNTER — Ambulatory Visit: Payer: Self-pay | Admitting: Internal Medicine

## 2012-01-13 ENCOUNTER — Telehealth (INDEPENDENT_AMBULATORY_CARE_PROVIDER_SITE_OTHER): Payer: Self-pay

## 2012-01-13 ENCOUNTER — Telehealth: Payer: Self-pay | Admitting: Gastroenterology

## 2012-01-13 NOTE — Telephone Encounter (Signed)
Pt calling in requesting refill on Percocet that was given to her the last office visit on 8/14. The pt is having more abdominal pain again. The pt has not seen the GI doctors yet. The pt has an appt with Dr Jarold Motto on 9/10. Pls advise.

## 2012-01-13 NOTE — Telephone Encounter (Signed)
Pt reports she did not keep her appt with Dr Marina Goodell because someone called here and said he was double booked and she would be r/s. The 01/26/12 appt was cancelled because I r/s the appt with Marina Goodell. Pt has had her Gall Bladder and Appendectomy recently and since then she has suffered from sever abdominal pain between her breastbone down to her navel. Also she develops pain after she eats or drinks anything, sometimes she throws up. She has constant diarrhea and blood is often in the stool. She reports an EGD 4-5 years ago at [facility] revealing reflux and a HH; she does not remember who did the procedure. Pt will see Mike Gip, PA  Tomorrow.

## 2012-01-13 NOTE — Telephone Encounter (Signed)
Called pt back to let her know that we do have a Percocet 5/325mg  #40 at the front desk for her to p/u. Copy made for chart.

## 2012-01-13 NOTE — Telephone Encounter (Signed)
Yes she can have 40 more Percocet, no refills.  You will have to get somebody else to sign the prescription because I'm the DOW.

## 2012-01-14 ENCOUNTER — Telehealth: Payer: Self-pay | Admitting: *Deleted

## 2012-01-14 ENCOUNTER — Ambulatory Visit: Payer: Self-pay | Admitting: Physician Assistant

## 2012-01-14 NOTE — Telephone Encounter (Signed)
Called pt to ask why she did not show up for her appt and she stated she tried to call at lunch and no one answered. States she couldn't come because her husband fell and is getting staples in his leg. When I asked where she stated [name]. I explained this is the 2nd visit she missed and she argued stated we cancelled the other appt. She then stated she did not like my attitude and she would call Dr Harlon Flor and ask for a referral to [facility].

## 2012-01-25 ENCOUNTER — Ambulatory Visit: Payer: Self-pay | Admitting: Obstetrics & Gynecology

## 2012-01-26 ENCOUNTER — Ambulatory Visit: Payer: Self-pay | Admitting: Gastroenterology

## 2012-01-27 ENCOUNTER — Ambulatory Visit: Payer: Self-pay | Admitting: Obstetrics & Gynecology

## 2012-01-29 ENCOUNTER — Telehealth: Payer: Self-pay | Admitting: Obstetrics and Gynecology

## 2012-01-29 NOTE — Telephone Encounter (Addendum)
Patient called with c/o of vaginal bleeding- changing saturated pad every half hour with pelvic pain and that radiates to lower back. Advised patient to go to [facility]. Patient agrees.

## 2012-02-01 ENCOUNTER — Telehealth: Payer: Self-pay | Admitting: *Deleted

## 2012-02-01 NOTE — Telephone Encounter (Signed)
Pt left message stating that she had spoken with someone on Friday about her bleeding and pain.  Her pain is in her back and abdomen and she is passing clots. She is requesting pain medicine Rx.  Per note from 01/29/12, pt was advised to go to [facility] for evaluation and agreed to do so.  No encounter found in Epic that pt went to [facility]U as directed.  I called pt and discussed her concerns. She stated that she was told to go to [facility]U if her bleeding was a saturated pad every half hour and her bleeding has slowed down.  She has continued to have pain and was not able to go to [facility] last week. I told pt that she requires an evaluation for her sx and current condition. No medication can be prescribed until this evaluation takes place and the provider will decide if it is appropriate. Pt voiced understanding.

## 2012-02-11 ENCOUNTER — Telehealth (INDEPENDENT_AMBULATORY_CARE_PROVIDER_SITE_OTHER): Payer: Self-pay | Admitting: General Surgery

## 2012-02-11 ENCOUNTER — Encounter (INDEPENDENT_AMBULATORY_CARE_PROVIDER_SITE_OTHER): Payer: Medicaid Other | Admitting: Surgery

## 2012-02-11 NOTE — Telephone Encounter (Signed)
Pt called to state she cannot find a ride to her appt (now) at Urgent clinic today.  She then asked for appt with Dr. Corliss Skains; appt made for Tues.  Pt advised to use ice to site, Aleve 2 tabs Q12H or Ibuprofen 2-3 tabs Q6H.  If pain severe, go to ER.

## 2012-02-16 ENCOUNTER — Encounter (INDEPENDENT_AMBULATORY_CARE_PROVIDER_SITE_OTHER): Payer: Medicaid Other | Admitting: Surgery

## 2012-02-17 ENCOUNTER — Ambulatory Visit: Payer: Self-pay | Admitting: Obstetrics & Gynecology

## 2012-02-21 ENCOUNTER — Emergency Department (HOSPITAL_COMMUNITY)
Admission: EM | Admit: 2012-02-21 | Discharge: 2012-02-21 | Disposition: A | Discharge status: Home / Self Care | Payer: Medicaid Other | Attending: Emergency Medicine | Admitting: Emergency Medicine

## 2012-02-21 ENCOUNTER — Emergency Department (HOSPITAL_COMMUNITY): Payer: Medicaid Other

## 2012-02-21 ENCOUNTER — Encounter (HOSPITAL_COMMUNITY): Payer: Self-pay | Admitting: Emergency Medicine

## 2012-02-21 DIAGNOSIS — IMO0002 Reserved for concepts with insufficient information to code with codable children: Secondary | ICD-10-CM | POA: Insufficient documentation

## 2012-02-21 DIAGNOSIS — F411 Generalized anxiety disorder: Secondary | ICD-10-CM | POA: Insufficient documentation

## 2012-02-21 DIAGNOSIS — G8929 Other chronic pain: Secondary | ICD-10-CM | POA: Insufficient documentation

## 2012-02-21 DIAGNOSIS — M26609 Unspecified temporomandibular joint disorder, unspecified side: Secondary | ICD-10-CM | POA: Insufficient documentation

## 2012-02-21 DIAGNOSIS — M25473 Effusion, unspecified ankle: Secondary | ICD-10-CM | POA: Insufficient documentation

## 2012-02-21 DIAGNOSIS — S8390XA Sprain of unspecified site of unspecified knee, initial encounter: Secondary | ICD-10-CM

## 2012-02-21 DIAGNOSIS — J45909 Unspecified asthma, uncomplicated: Secondary | ICD-10-CM | POA: Insufficient documentation

## 2012-02-21 DIAGNOSIS — S93401A Sprain of unspecified ligament of right ankle, initial encounter: Secondary | ICD-10-CM

## 2012-02-21 DIAGNOSIS — W19XXXA Unspecified fall, initial encounter: Secondary | ICD-10-CM | POA: Insufficient documentation

## 2012-02-21 DIAGNOSIS — S8000XA Contusion of unspecified knee, initial encounter: Secondary | ICD-10-CM | POA: Insufficient documentation

## 2012-02-21 DIAGNOSIS — K219 Gastro-esophageal reflux disease without esophagitis: Secondary | ICD-10-CM | POA: Insufficient documentation

## 2012-02-21 DIAGNOSIS — I10 Essential (primary) hypertension: Secondary | ICD-10-CM | POA: Insufficient documentation

## 2012-02-21 DIAGNOSIS — S8001XA Contusion of right knee, initial encounter: Secondary | ICD-10-CM

## 2012-02-21 DIAGNOSIS — M25476 Effusion, unspecified foot: Secondary | ICD-10-CM | POA: Insufficient documentation

## 2012-02-21 DIAGNOSIS — R011 Cardiac murmur, unspecified: Secondary | ICD-10-CM | POA: Insufficient documentation

## 2012-02-21 MED ORDER — OXYCODONE-ACETAMINOPHEN 5-325 MG PO TABS
2.0000 | ORAL_TABLET | Freq: Once | ORAL | Status: AC
  Administered 2012-02-21: 2 via ORAL
  Filled 2012-02-21: qty 2

## 2012-02-21 MED ORDER — OXYCODONE-ACETAMINOPHEN 5-325 MG PO TABS: 2.0000 | ORAL_TABLET | Freq: Four times a day (QID) | ORAL | Status: DC | PRN

## 2012-02-21 NOTE — ED Notes (Signed)
Pt c/o right knee and ankle pain after falling on Friday that is getting worse; pt sts some swelling to right leg normal but has increased; CMS intact

## 2012-02-21 NOTE — ED Provider Notes (Signed)
History  This chart was scribed for Sharon Horn, MD by Erskine Emery. This patient was seen in room TR05C/TR05C and the patient's care was started at 17:15.   CSN: [id number]  Arrival date & time 02/21/12  1520   None     Chief Complaint  Patient presents with  . Ankle Pain  . Knee Pain    (Consider location/radiation/quality/duration/timing/severity/associated sxs/prior treatment) The history is provided by the patient. No language interpreter was used.  [name] is a 41 y.o. female who presents to the Emergency Department complaining of right knee, right ankle pain, and mid back pain since a fall on Friday. Pt denies any head or neck injury but reports some associated insomnia. Pt reports taking some ibuprofen for the pain which does not relieve the pain. Pt has a h/o DDD with chronic back pain and a h/o right ankle surgery.  Pt reports she had an appendectomy and cholecystectomy within 6 days of each other in May.  Pt has an orthopedic doctor to follow up with.   Past Medical History  Diagnosis Date  . Hypertension   . Anxiety   . Panic attack   . TMJ disease     can open mouth wide but jaw pops  . Acid reflux   . Asthma     no inhaler use  . Heart murmur     developed during pregnancy- "leaky valve"  . Anemia   . SVD (spontaneous vaginal delivery)     x 3  . H/O endoscopy     checking for reflux    Past Surgical History  Procedure Date  . Eye surgery   . Dilitation and curettage   . Ankle surgery   . Laparoscopic appendectomy 10/05/2011    Procedure: APPENDECTOMY LAPAROSCOPIC;  Surgeon: Valarie Merino, MD;  Location: [facility] OR;  Service: General;  Laterality: N/A;  . Cholecystectomy 10/12/2011    Procedure: LAPAROSCOPIC CHOLECYSTECTOMY WITH INTRAOPERATIVE CHOLANGIOGRAM;  Surgeon: Wilmon Arms. Corliss Skains, MD;  Location: [facility] OR;  Service: General;  Laterality: N/A;  . Appendectomy   . Dilation and curettage of uterus   . Wisdom tooth extraction   . Laser ablation      Family History  Problem Relation Age of Onset  . Hypertension Other     History  Substance Use Topics  . Smoking status: Current Every Day Smoker -- 1.0 packs/day for 9 years    Types: Cigarettes  . Smokeless tobacco: Never Used  . Alcohol Use: No    OB History    Grav Para Term Preterm Abortions TAB SAB Ect Mult Living   5 3 3  2  2   3       Review of Systems 10 Systems reviewed and are negative for acute change except as noted in the HPI.   Allergies  Morphine and related and Paroxetine  Home Medications   Current Outpatient Rx  Name Route Sig Dispense Refill  . ALPRAZOLAM 1 MG PO TABS Oral Take 1 mg by mouth 3 (three) times daily as needed. For anxiety    . LISINOPRIL 10 MG PO TABS Oral Take 10 mg by mouth daily.     [name] OMEPRAZOLE 20 MG PO CPDR Oral Take 20 mg by mouth daily.    . OXYCODONE-ACETAMINOPHEN 5-325 MG PO TABS Oral Take 2 tablets by mouth every 6 (six) hours as needed for pain. 20 tablet 0    BP 143/95  Pulse 94  Temp 99 F (37.2 C) (  Oral)  Resp 18  SpO2 95%  Physical Exam  Nursing note and vitals reviewed. Constitutional:       Awake, alert, nontoxic appearance.  HENT:  Head: Atraumatic.  Eyes: Right eye exhibits no discharge. Left eye exhibits no discharge.  Neck: Neck supple.  Pulmonary/Chest: Effort normal. She exhibits no tenderness.  Abdominal: Soft. There is no tenderness. There is no rebound.  Musculoskeletal: She exhibits no tenderness.       Right anterior medial knee: busied and diffusely tender. Lachman's is stable. No laxity with varus or valgus stress. Able to extend against resistance. Decreased ROM in right knee due to pain. Right lateral ankle is swollen and diffusely tender. Medial ankle is nontender. Achilles tendon is intact. Proximal 5th metatarsal and midfoot are nontender.  Right foot CR less than 2 seconds, good color. DP pulse intact.  Neurological:       Mental status and motor strength appears baseline for patient  and situation.  Skin: No rash noted.  Psychiatric: She has a normal mood and affect.    ED Course  Procedures (including critical care time) DIAGNOSTIC STUDIES: Oxygen Saturation is 95% on room air, adequate by my interpretation.    COORDINATION OF CARE: 17:15--Patient / Family / Caregiver informed of clinical course, understand medical decision-making process, and agree with plan.  Dg Ankle Complete Right  02/21/2012  *RADIOLOGY REPORT*  Clinical Data: Ankle and knee pain since falling 2 days ago.  RIGHT ANKLE - COMPLETE 3+ VIEW  Comparison: 07/10/2011 radiographs.  Findings: There are stable postsurgical changes status post medial malleolar screw and distal fibular pin fixation.  There are stable degenerative changes at the ankle.  No acute fracture or dislocation is seen.  A plantar calcaneal spur appears stable. There is progressive prominence of the soft tissues in the lower leg which could reflect swelling.  IMPRESSION: No acute osseous findings.  Possible lower leg soft tissue swelling.   Original Report Authenticated By: Gerrianne Scale, M.D.    Dg Knee Complete 4 Views Right  02/21/2012  *RADIOLOGY REPORT*  Clinical Data: Knee and ankle pain after falling 2 days ago.  RIGHT KNEE - COMPLETE 4+ VIEW  Comparison: Lower leg radiographs 03/22/2011.  Findings: The mineralization and alignment are normal.  There is no evidence of acute fracture or dislocation.  There are small medial compartment osteophytes.  There is a small knee joint effusion.  IMPRESSION: No acute osseous findings.  Small knee joint effusion.   Original Report Authenticated By: Gerrianne Scale, M.D.        MDM   Patient / Family / Caregiver informed of clinical course, understand medical decision-making process, and agree with plan. I personally performed the services described in this documentation, which was scribed in my presence. The recorded information has been reviewed and considered. I doubt any other [facility]  precluding discharge at this time.  Sharon Horn, MD 02/23/12 [phone]

## 2012-02-21 NOTE — ED Notes (Signed)
Ortho paged for ankle splint, knee immobilizer and crutches

## 2012-02-21 NOTE — Progress Notes (Signed)
Orthopedic Tech Progress Note Patient Details:  [name] [birth year] [id number]  Ortho Devices Type of Ortho Device: ASO;Crutches;Knee Immobilizer Ortho Device/Splint Location: (R) LE all splints Ortho Device/Splint Interventions: Ordered;Application   Jennye Moccasin 02/21/2012, 6:08 PM

## 2012-03-14 ENCOUNTER — Other Ambulatory Visit (HOSPITAL_COMMUNITY): Payer: Self-pay | Admitting: Internal Medicine

## 2012-03-14 DIAGNOSIS — Z1231 Encounter for screening mammogram for malignant neoplasm of breast: Secondary | ICD-10-CM

## 2012-04-01 ENCOUNTER — Ambulatory Visit (HOSPITAL_COMMUNITY): Payer: Medicaid Other

## 2012-04-22 ENCOUNTER — Ambulatory Visit (HOSPITAL_COMMUNITY): Payer: Medicaid Other | Attending: Internal Medicine

## 2012-10-19 ENCOUNTER — Ambulatory Visit: Payer: Medicaid Other | Attending: Family Medicine | Admitting: Family Medicine

## 2012-10-19 VITALS — BP 121/81 | HR 106 | Temp 97.1°F | Resp 18 | Wt 164.6 lb

## 2012-10-19 DIAGNOSIS — R062 Wheezing: Secondary | ICD-10-CM | POA: Insufficient documentation

## 2012-10-19 DIAGNOSIS — I10 Essential (primary) hypertension: Secondary | ICD-10-CM

## 2012-10-19 DIAGNOSIS — J45909 Unspecified asthma, uncomplicated: Secondary | ICD-10-CM

## 2012-10-19 DIAGNOSIS — K219 Gastro-esophageal reflux disease without esophagitis: Secondary | ICD-10-CM

## 2012-10-19 DIAGNOSIS — G894 Chronic pain syndrome: Secondary | ICD-10-CM

## 2012-10-19 DIAGNOSIS — M1990 Unspecified osteoarthritis, unspecified site: Secondary | ICD-10-CM

## 2012-10-19 MED ORDER — LISINOPRIL-HYDROCHLOROTHIAZIDE 10-12.5 MG PO TABS: 1.0000 | ORAL_TABLET | Freq: Every day | ORAL | Status: DC

## 2012-10-19 MED ORDER — ESOMEPRAZOLE MAGNESIUM 20 MG PO CPDR: 20.0000 mg | DELAYED_RELEASE_CAPSULE | Freq: Every day | ORAL | Status: DC

## 2012-10-19 MED ORDER — ALBUTEROL SULFATE HFA 108 (90 BASE) MCG/ACT IN AERS: 2.0000 | INHALATION_SPRAY | Freq: Four times a day (QID) | RESPIRATORY_TRACT | Status: DC | PRN

## 2012-10-19 NOTE — Patient Instructions (Addendum)
Smoking Cessation, Tips for Success YOU CAN QUIT SMOKING If you are ready to quit smoking, congratulations! You have chosen to help yourself be healthier. Cigarettes bring nicotine, tar, carbon monoxide, and other irritants into your body. Your lungs, heart, and blood vessels will be able to work better without these poisons. There are many different ways to quit smoking. Nicotine gum, nicotine patches, a nicotine inhaler, or nicotine nasal spray can help with physical craving. Hypnosis, support groups, and medicines help break the habit of smoking. Here are some tips to help you quit for good.  Throw away all cigarettes.  Clean and remove all ashtrays from your home, work, and car.  On a card, write down your reasons for quitting. Carry the card with you and read it when you get the urge to smoke.  Cleanse your body of nicotine. Drink enough water and fluids to keep your urine clear or pale yellow. Do this after quitting to flush the nicotine from your body.  Learn to predict your moods. Do not let a bad situation be your excuse to have a cigarette. Some situations in your life might tempt you into wanting a cigarette.  Never have "just one" cigarette. It leads to wanting another and another. Remind yourself of your decision to quit.  Change habits associated with smoking. If you smoked while driving or when feeling stressed, try other activities to replace smoking. Stand up when drinking your coffee. Brush your teeth after eating. Sit in a different chair when you read the paper. Avoid alcohol while trying to quit, and try to drink fewer caffeinated beverages. Alcohol and caffeine may urge you to smoke.  Avoid foods and drinks that can trigger a desire to smoke, such as sugary or spicy foods and alcohol.  Ask people who smoke not to smoke around you.  Have something planned to do right after eating or having a cup of coffee. Take a walk or exercise to perk you up. This will help to keep you  from overeating.  Try a relaxation exercise to calm you down and decrease your stress. Remember, you may be tense and nervous for the first 2 weeks after you quit, but this will pass.  Find new activities to keep your hands busy. Play with a pen, coin, or rubber band. Doodle or draw things on paper.  Brush your teeth right after eating. This will help cut down on the craving for the taste of tobacco after meals. You can try mouthwash, too.  Use oral substitutes, such as lemon drops, carrots, a cinnamon stick, or chewing gum, in place of cigarettes. Keep them handy so they are available when you have the urge to smoke.  When you have the urge to smoke, try deep breathing.  Designate your home as a nonsmoking area.  If you are a heavy smoker, ask your caregiver about a prescription for nicotine chewing gum. It can ease your withdrawal from nicotine.  Reward yourself. Set aside the cigarette money you save and buy yourself something nice.  Look for support from others. Join a support group or smoking cessation program. Ask someone at home or at work to help you with your plan to quit smoking.  Always ask yourself, "Do I need this cigarette or is this just a reflex?" Tell yourself, "Today, I choose not to smoke," or "I do not want to smoke." You are reminding yourself of your decision to quit, even if you do smoke a cigarette. HOW WILL I FEEL WHEN   I QUIT SMOKING?  The benefits of not smoking start within days of quitting.  You may have symptoms of withdrawal because your body is used to nicotine (the addictive substance in cigarettes). You may crave cigarettes, be irritable, feel very hungry, cough often, get headaches, or have difficulty concentrating.  The withdrawal symptoms are only temporary. They are strongest when you first quit but will go away within 10 to 14 days.  When withdrawal symptoms occur, stay in control. Think about your reasons for quitting. Remind yourself that these are  signs that your body is healing and getting used to being without cigarettes.  Remember that withdrawal symptoms are easier to treat than the major diseases that smoking can cause.  Even after the withdrawal is over, expect periodic urges to smoke. However, these cravings are generally short-lived and will go away whether you smoke or not. Do not smoke!  If you relapse and smoke again, do not lose hope. Most smokers quit 3 times before they are successful.  If you relapse, do not give up! Plan ahead and think about what you will do the next time you get the urge to smoke. LIFE AS A NONSMOKER: MAKE IT FOR A MONTH, MAKE IT FOR LIFE Day 1: Hang this page where you will see it every day. Day 2: Get rid of all ashtrays, matches, and lighters. Day 3: Drink water. Breathe deeply between sips. Day 4: Avoid places with smoke-filled air, such as bars, clubs, or the smoking section of restaurants. Day 5: Keep track of how much money you save by not smoking. Day 6: Avoid boredom. Keep a good book with you or go to the movies. Day 7: Reward yourself! One week without smoking! Day 8: Make a dental appointment to get your teeth cleaned. Day 9: Decide how you will turn down a cigarette before it is offered to you. Day 10: Review your reasons for quitting. Day 11: Distract yourself. Stay active to keep your mind off smoking and to relieve tension. Take a walk, exercise, read a book, do a crossword puzzle, or try a new hobby. Day 12: Exercise. Get off the bus before your stop or use stairs instead of escalators. Day 13: Call on friends for support and encouragement. Day 14: Reward yourself! Two weeks without smoking! Day 15: Practice deep breathing exercises. Day 16: Bet a friend that you can stay a nonsmoker. Day 17: Ask to sit in nonsmoking sections of restaurants. Day 18: Hang up "No Smoking" signs. Day 19: Think of yourself as a nonsmoker. Day 20: Each morning, tell yourself you will not smoke. Day  21: Reward yourself! Three weeks without smoking! Day 22: Think of smoking in negative ways. Remember how it stains your teeth, gives you bad breath, and leaves you short of breath. Day 23: Eat a nutritious breakfast. Day 24:Do not relive your days as a smoker. Day 25: Hold a pencil in your hand when talking on the telephone. Day 26: Tell all your friends you do not smoke. Day 27: Think about how much better food tastes. Day 28: Remember, one cigarette is one too many. Day 29: Take up a hobby that will keep your hands busy. Day 30: Congratulations! One month without smoking! Give yourself a big reward. Your caregiver can direct you to community resources or hospitals for support, which may include:  Group support.  Education.  Hypnosis.  Subliminal therapy. Document Released: 01/31/2004 Document Revised: 07/27/2011 Document Reviewed: 02/18/2009 [facility] Patient Information 2014 [place], [place]. Anxiety  and Panic Attacks Your caregiver has informed you that you are having an anxiety or panic attack. There may be many forms of this. Most of the time these attacks come suddenly and without warning. They come at any time of day, including periods of sleep, and at any time of life. They may be strong and unexplained. Although panic attacks are very scary, they are physically harmless. Sometimes the cause of your anxiety is not known. Anxiety is a protective mechanism of the body in its fight or flight mechanism. Most of these perceived danger situations are actually nonphysical situations (such as anxiety over losing a job). CAUSES  The causes of an anxiety or panic attack are many. Panic attacks may occur in otherwise healthy people given a certain set of circumstances. There may be a genetic cause for panic attacks. Some medications may also have anxiety as a side effect. SYMPTOMS  Some of the most common feelings are:  Intense terror.  Dizziness, feeling faint.  Hot and cold  flashes.  Fear of going crazy.  Feelings that nothing is real.  Sweating.  Shaking.  Chest pain or a fast heartbeat (palpitations).  Smothering, choking sensations.  Feelings of impending doom and that death is near.  Tingling of extremities, this may be from over-breathing.  Altered reality (derealization).  Being detached from yourself (depersonalization). Several symptoms can be present to make up anxiety or panic attacks. DIAGNOSIS  The evaluation by your caregiver will depend on the type of symptoms you are experiencing. The diagnosis of anxiety or panic attack is made when no physical illness can be determined to be a cause of the symptoms. TREATMENT  Treatment to prevent anxiety and panic attacks may include:  Avoidance of circumstances that cause anxiety.  Reassurance and relaxation.  Regular exercise.  Relaxation therapies, such as yoga.  Psychotherapy with a psychiatrist or therapist.  Avoidance of caffeine, alcohol and illegal drugs.  Prescribed medication. SEEK IMMEDIATE MEDICAL CARE IF:   You experience panic attack symptoms that are different than your usual symptoms.  You have any worsening or concerning symptoms. Document Released: 05/04/2005 Document Revised: 07/27/2011 Document Reviewed: 09/05/2009 [facility] Patient Information 2014 [place], [place]. Chronic Pain Chronic pain can be defined as pain that is lasting, off and on, and lasts for 3 to 6 months or longer. Many things cause chronic pain, which can make it difficult to make a discrete diagnosis. There are many treatment options available for chronic pain. However, finding a treatment that works well for you may require trying various approaches until a suitable one is found. CAUSES  In some types of chronic medical conditions, the pain is caused by a normal pain response within the body. A normal pain response helps the body identify illness or injury and prevent further damage from being  done. In these cases, the cause of the pain may be identified and treated, even if it may not be cured completely. Examples of chronic conditions which can cause chronic pain include:  Inflammation of the joints (arthritis).  Back pain or neck pain (including bulging or herniated disks).  Migraine headaches.  Cancer. In some other types of chronic pain syndromes, the pain is caused by an abnormal pain response within the body. An abnormal pain response is present when there is no ongoing cause (or stimulus) for the pain, or when the cause of the pain is arising from the nerves or nervous system itself. Examples of conditions which can cause chronic pain due to an abnormal  pain response include:  Fibromyalgia.  Reflex sympathetic dystrophy (RSD).  Neuropathy (when the nerves themselves are damaged, and may cause pain). DIAGNOSIS  Your caregiver will help diagnose your condition over time. In many cases, the initial focus will be on excluding conditions that could be causing the pain. Depending on your symptoms, your caregiver may order some tests to diagnose your condition. Some of these tests include:  Blood tests.  Computerized X-ray scans (CT scan).  Computerized magnetic scans (MRI).  X-rays.  Ultrasounds.  Nerve conduction studies.  Consultation with other physicians or specialists. TREATMENT  There are many treatment options for people suffering from chronic pain. Finding a treatment that works well may take time.   You may be referred to a pain management specialist.  You may be put on medication to help with the pain. Unfortunately, some medications (such as opiate medications) may not be very effective in cases where chronic pain is due to abnormal pain responses. Finding the right medications can take some time.  Adjunctive therapies may be used to provide additional relief and improve a patient's quality of life. These therapies include:  Mindfulness  meditation.  Acupuncture.  Biofeedback.  Cognitive-behavioral therapy.  In certain cases, surgical interventions may be attempted. HOME CARE INSTRUCTIONS   Make sure you understand these instructions prior to discharge.  Ask any questions and share any further concerns you have with your caregiver prior to discharge.  Take all medications as directed by your caregiver.  Keep all follow-up appointments. SEEK MEDICAL CARE IF:   Your pain gets worse.  You develop a new pain that was not present before.  You cannot tolerate any medications prescribed by your caregiver.  You develop new symptoms since your last visit with your caregiver. SEEK IMMEDIATE MEDICAL CARE IF:   You develop muscular weakness.  You have decreased sensation or numbness.  You lose control of bowel or bladder function.  Your pain suddenly gets much worse.  You have an oral temperature above 102 F (38.9 C), not controlled by medication.  You develop shaking chills, confusion, chest pain, or shortness of breath. Document Released: 01/24/2002 Document Revised: 07/27/2011 Document Reviewed: 05/02/2008 [facility] Patient Information 2014 [place], [place]. Asthma Attack Prevention HOW CAN ASTHMA BE PREVENTED? Currently, there is no way to prevent asthma from starting. However, you can take steps to control the disease and prevent its symptoms after you have been diagnosed. Learn about your asthma and how to control it. Take an active role to control your asthma by working with your caregiver to create and follow an asthma action plan. An asthma action plan guides you in taking your medicines properly, avoiding factors that make your asthma worse, tracking your level of asthma control, responding to worsening asthma, and seeking emergency care when needed. To track your asthma, keep records of your symptoms, check your peak flow number using a peak flow meter (handheld device that shows how well air moves out of  your lungs), and get regular asthma checkups.  Other ways to prevent asthma attacks include:  Use medicines as your caregiver directs.  Identify and avoid things that make your asthma worse (as much as you can).  Keep track of your asthma symptoms and level of control.  Get regular checkups for your asthma.  With your caregiver, write a detailed plan for taking medicines and managing an asthma attack. Then be sure to follow your action plan. Asthma is an ongoing condition that needs regular monitoring and treatment.  Identify  and avoid asthma triggers. A number of outdoor allergens and irritants (pollen, mold, cold air, air pollution) can trigger asthma attacks. Find out what causes or makes your asthma worse, and take steps to avoid those triggers.  Monitor your breathing. Learn to recognize warning signs of an attack, such as slight coughing, wheezing or shortness of breath. However, your lung function may already decrease before you notice any signs or symptoms, so regularly measure and record your peak airflow with a home peak flow meter.  Identify and treat attacks early. If you act quickly, you're less likely to have a severe attack. You will also need less medicine to control your symptoms. When your peak flow measurements decrease and alert you to an upcoming attack, take your medicine as instructed, and immediately stop any activity that may have triggered the attack. If your symptoms do not improve, get medical help.  Pay attention to increasing quick-relief inhaler use. If you find yourself relying on your quick-relief inhaler (such as albuterol), your asthma is not under control. See your caregiver about adjusting your treatment. IDENTIFY AND CONTROL FACTORS THAT MAKE YOUR ASTHMA WORSE A number of common things can set off or make your asthma symptoms worse (asthma triggers). Keep track of your asthma symptoms for several weeks, detailing all the environmental and emotional factors  that are linked with your asthma. When you have an asthma attack, go back to your asthma diary to see which factor, or combination of factors, might have contributed to it. Once you know what these factors are, you can take steps to control many of them.  Allergies: If you have allergies and asthma, it is important to take asthma prevention steps at home. Asthma attacks (worsening of asthma symptoms) can be triggered by allergies, which can cause temporary increased inflammation of your airways. Minimizing contact with the substance to which you are allergic will help prevent an asthma attack. Animal Dander:   Some people are allergic to the flakes of skin or dried saliva from animals with fur or feathers. Keep these pets out of your home.  If you can't keep a pet outdoors, keep the pet out of your bedroom and other sleeping areas at all times, and keep the door closed.  Remove carpets and furniture covered with cloth from your home. If that is not possible, keep the pet away from fabric-covered furniture and carpets. Dust Mites:  Many people with asthma are allergic to dust mites. Dust mites are tiny bugs that are found in every home, in mattresses, pillows, carpets, fabric-covered furniture, bedcovers, clothes, stuffed toys, fabric, and other fabric-covered items.  Cover your mattress in a special dust-proof cover.  Cover your pillow in a special dust-proof cover, or wash the pillow each week in hot water. Water must be hotter than 130 F to kill dust mites. Cold or warm water used with detergent and bleach can also be effective.  Wash the sheets and blankets on your bed each week in hot water.  Try not to sleep or lie on cloth-covered cushions.  Call ahead when traveling and ask for a smoke-free hotel room. Bring your own bedding and pillows, in case the hotel only supplies feather pillows and down comforters, which may contain dust mites and cause asthma symptoms.  Remove carpets from  your bedroom and those laid on concrete, if you can.  Keep stuffed toys out of the bed, or wash the toys weekly in hot water or cooler water with detergent and bleach. Cockroaches:  Many people with asthma are allergic to the droppings and remains of cockroaches.  Keep food and garbage in closed containers. Never leave food out.  Use poison baits, traps, powders, gels, or paste (for example, boric acid).  If a spray is used to kill cockroaches, stay out of the room until the odor goes away. Indoor Mold:  Fix leaky faucets, pipes, or other sources of water that have mold around them.  Clean floors and moldy surfaces with a fungicide or diluted bleach.  Avoid using humidifiers, vaporizers, or swamp coolers. These can spread molds through the air. Pollen and Outdoor Mold:  When pollen or mold spore counts are high, try to keep your windows closed.  Stay indoors with windows closed from late morning to afternoon, if you can. Pollen and some mold spore counts are highest at that time.  Ask your caregiver whether you need to take or increase anti-inflammatory medicine before your allergy season starts. Irritants:   Tobacco smoke is an irritant. If you smoke, ask your caregiver how you can quit. Ask family members to quit smoking, too. Do not allow smoking in your home or car.  If possible, do not use a wood-burning stove, kerosene heater, or fireplace. Minimize exposure to all sources of smoke, including incense, candles, fires, and fireworks.  Try to stay away from strong odors and sprays, such as perfume, talcum powder, hair spray, and paints.  Decrease humidity in your home and use an indoor air cleaning device. Reduce indoor humidity to below 60 percent. Dehumidifiers or central air conditioners can do this.  Decrease house dust exposure by changing furnace and air cooler filters frequently.  Try to have someone else vacuum for you once or twice a week, if you can. Stay out of  rooms while they are being vacuumed and for a short while afterward.  If you vacuum, use a dust mask from a hardware store, a double-layered or microfilter vacuum cleaner bag, or a vacuum cleaner with a HEPA filter.  Sulfites in foods and beverages can be irritants. Do not drink beer or wine, or eat dried fruit, processed potatoes, or shrimp if they cause asthma symptoms.  Cold air can trigger an asthma attack. Cover your nose and mouth with a scarf on cold or windy days.  Several health conditions can make asthma more difficult to manage, including runny nose, sinus infections, reflux disease, psychological stress, and sleep apnea. Your caregiver will treat these conditions, as well.  Avoid close contact with people who have a cold or the flu, since your asthma symptoms may get worse if you catch the infection from them. Wash your hands thoroughly after touching items that may have been handled by people with a respiratory infection.  Get a flu shot every year to protect against the flu virus, which often makes asthma worse for days or weeks. Also get a pneumonia shot once every 5 10 years. Medicines:  Aspirin and other pain relievers can cause asthma attacks. Ten percent to 20% of people with asthma have sensitivity to aspirin or a group of pain relievers called non-steroidal anti-inflammatory medicines (NSAIDS), such as ibuprofen and naproxen. These medicines are used to treat pain and reduce fevers. Asthma attacks caused by any of these medicines can be severe and even fatal. These medicines must be avoided in people who have known aspirin sensitive asthma. Products with acetaminophen are considered safe for people who have asthma. It is important that people with aspirin sensitivity read labels of all over-the-counter  medicines used to treat pain, colds, coughs, and fever.  Beta blockers and ACE inhibitors are other medicines which you should discuss with your caregiver, in relation to your  asthma. ALLERGY SKIN TESTING  Ask your asthma caregiver about allergy skin testing or blood testing (RAST test) to identify the allergens to which you are sensitive. If you are found to have allergies, allergy shots (immunotherapy) for asthma may help prevent future allergies and asthma. With allergy shots, small doses of allergens (substances to which you are allergic) are injected under your skin on a regular schedule. Over a period of time, your body may become used to the allergen and less responsive with asthma symptoms. You can also take measures to minimize your exposure to those allergens. EXERCISE  If you have exercise-induced asthma, or are planning vigorous exercise, or exercise in cold, humid, or dry environments, prevent exercise-induced asthma by following your caregiver's advice regarding asthma treatment before exercising. Document Released: 04/22/2009 Document Revised: 04/20/2012 Document Reviewed: 04/22/2009 [facility] Patient Information 2014 [place], [place].

## 2012-10-19 NOTE — Progress Notes (Signed)
Patient here to establish care Sees pain management Allergy to anti depression meds/flagyl

## 2012-10-19 NOTE — Progress Notes (Addendum)
Patient ID: [name], female   DOB: 29-Dec-1970, 42 y.o.   MRN: [id number]  CC:  establish  HPI  Pt has presented here to establish because of her not being able to get along with her previous primary care provider.  Pt says that she has been without her xanax since August 24, 2012.  She had been going to [facility].  She says that that PA that she was seeing refused to give her xanax.  She says that mental health wanted her to take zoloft and she was not going to take that.  Pt says that she would be willing to see another psychiatrist.  She is in pain management from multiple fractures and chronic pain.  Pt says that she is angry with [facility] because they would not prescribe for her to be on xanax. She did not bring any of her medical records with her today.  She says that s  Allergies  Allergen Reactions  . Morphine And Related Hives and Shortness Of Breath    Severe chest pain, tolerates percocet/Dilaudid  . Flagyl (Metronidazole)   . Paroxetine Other (See Comments)    REACTION: delerium and panic reaction Pt states she is allergic to all antidepressants   Past Medical History  Diagnosis Date  . Hypertension   . Anxiety   . Panic attack   . TMJ disease     can open mouth wide but jaw pops  . Acid reflux   . Asthma     no inhaler use  . Heart murmur     developed during pregnancy- "leaky valve"  . Anemia   . SVD (spontaneous vaginal delivery)     x 3  . H/O endoscopy     checking for reflux   Current Outpatient Prescriptions on File Prior to Visit  Medication Sig Dispense Refill  . ALPRAZolam (XANAX) 1 MG tablet Take 1 mg by mouth 3 (three) times daily as needed. For anxiety      . lisinopril (PRINIVIL,ZESTRIL) 10 MG tablet Take 10 mg by mouth daily.       [name] omeprazole (PRILOSEC) 20 MG capsule Take 20 mg by mouth daily.      [name] oxyCODONE-acetaminophen (PERCOCET) 5-325 MG per tablet Take 2 tablets by mouth every 6 (six) hours as needed for pain.  20 tablet  0   No current  facility-administered medications on file prior to visit.   Family History  Problem Relation Age of Onset  . Hypertension Other    History   Social History  . Marital Status: Single    Spouse Name: N/A    Number of Children: N/A  . Years of Education: N/A   Occupational History  . Not on file.   Social History Main Topics  . Smoking status: Current Every Day Smoker -- 1.00 packs/day for 9 years    Types: Cigarettes  . Smokeless tobacco: Never Used  . Alcohol Use: No  . Drug Use: No  . Sexually Active: Yes    Birth Control/ Protection: Condom     Comment: IUD removed by md in office    Other Topics Concern  . Not on file   Social History Narrative  . No narrative on file    Review of Systems  Constitutional: Negative for fever, chills, diaphoresis, activity change, appetite change and fatigue.  HENT: Negative for ear pain, nosebleeds, congestion, facial swelling, rhinorrhea, neck pain, neck stiffness and ear discharge.   Eyes: Negative for pain, discharge, redness, itching  and visual disturbance.  Respiratory: Negative for cough, choking, chest tightness, shortness of breath, wheezing and stridor.   Cardiovascular: Negative for chest pain, palpitations and leg swelling.  Gastrointestinal: Negative for abdominal distention.  Genitourinary: Negative for dysuria, urgency, frequency, hematuria, flank pain, decreased urine volume, difficulty urinating and dyspareunia.  Musculoskeletal: bilateral edema ankles. Negative for back pain, joint swelling, arthralgias and gait problem.  Neurological: Negative for dizziness, tremors, seizures, syncope, facial asymmetry, speech difficulty, weakness, light-headedness, numbness and headaches.  Hematological: Negative for adenopathy. Does not bruise/bleed easily.  Psychiatric/Behavioral: anxiety reported   Objective:   Filed Vitals:   10/19/12 1612  BP: 121/81  Pulse: 106  Temp: 97.1 F (36.2 C)  Resp: 18    Physical Exam   Constitutional: Appears well-developed and well-nourished. No distress.  HENT: Normocephalic. External right and left ear normal. Oropharynx is clear and moist.  Eyes: Conjunctivae and EOM are normal. PERRLA, no scleral icterus.  Neck: Normal ROM. Neck supple. No JVD. No tracheal deviation. No thyromegaly.  CVS: RRR, S1/S2 +, no murmurs, no gallops, no carotid bruit.  Pulmonary: Effort and breath sounds normal, no stridor, rhonchi, wheezes, rales.  Abdominal: Soft. BS +,  no distension, tenderness, rebound or guarding.  Musculoskeletal: Normal range of motion. 1+ nonpitting edema and no tenderness.  Lymphadenopathy: No lymphadenopathy noted, cervical, inguinal. Neuro: Alert. Normal reflexes, muscle tone coordination. No cranial nerve deficit. Skin: Skin is warm and dry. No rash noted. Not diaphoretic. No erythema. No pallor.  Psychiatric: Normal mood and affect. Behavior, judgment, thought content normal.   Lab Results  Component Value Date   WBC 8.4 12/20/2011   HGB 11.5* 12/20/2011   HCT 37.2 12/20/2011   MCV 78.5 12/20/2011   PLT 353 12/20/2011   Lab Results  Component Value Date   CREATININE 0.58 12/20/2011   BUN 4* 12/20/2011   NA 140 12/20/2011   K 3.9 12/20/2011   CL 105 12/20/2011   CO2 25 12/20/2011    No results found for this basename: HGBA1C   Lipid Panel     Component Value Date/Time   CHOL 162 11/20/2011 1954   TRIG 105 11/20/2011 1954   HDL 43 11/20/2011 1954   CHOLHDL 3.8 11/20/2011 1954   VLDL 21 11/20/2011 1954   LDLCALC 98 11/20/2011 1954       Assessment and plan:   Patient Active Problem List   Diagnosis Date Noted  . Bloody diarrhea 12/30/2011  . Nausea and vomiting 12/30/2011  . Menorrhagia 11/06/2011  . Endometritis 11/06/2011  . Chronic calculus cholecystitis 10/30/2011  . Appendicitis, acute 10/05/2011  . SYSTOLIC MURMUR 09/02/2006  . MORBID OBESITY 07/15/2006  . ANEMIA, IRON DEFICIENCY, UNSPEC. 07/15/2006  . ANXIETY 07/15/2006  . PANIC ATTACKS 07/15/2006  .  OBSESSIVE COMPUL. DISORDER 07/15/2006  . HYPERTENSION, BENIGN SYSTEMIC 07/15/2006  . GASTROESOPHAGEAL REFLUX, NO ESOPHAGITIS 07/15/2006   Pt says that she would like a prescription for xanax. I explained to her that she did not bring any of her medical records and she has been followed by a psychiatry office but not been following up with them regularly.  Will try to send for old records and refer to psychiatry.  Pt's PCP is Dr. Roseanne Reno.  Pt is going to the [facility] for her pain management.  Pt says she has not had any xanax since April so the xanax is out of her system if she is telling me the truth.  I gave her information and instructions for the [facility] emergency consultation telephone number, also information for the [facility] walk in mental health evaluation center M-F 8-3p.    Pt says that she needs her albuterol ventolin inhaler to use as needed for wheezing and cough and SOB.  Send for her old records.  I gave her the emergency psychiatric number to use as needed.  Pt reports that she has been wheezing constantly and that her PCP refuses to give her an inhaler.  Pt strongly advised to stop using tobacco.  She is unwilling to consider any alternative treatment for her anxiety other that xanax.     Follow up in 1 month  The patient was given clear instructions to go to ER or return to medical center if symptoms don't improve, worsen or new problems develop.  The patient verbalized understanding.  The patient was told to call to get lab results if they haven't heard anything in the next week.    Rodney Langton, MD, CDE, FAAFP [facility] [place], [place]

## 2012-10-20 ENCOUNTER — Telehealth: Payer: Self-pay | Admitting: Internal Medicine

## 2012-10-20 NOTE — Telephone Encounter (Signed)
Pt here yesterday, and told that we do not prescribe anxiety meds. Pt was given info to behavioral health and other psych resources.  Pt says she has been on Xanax for 11 years and has never gotten it from psychiatrist.  Pt says she called resources but they do not have upcoming appts. Pt's story does does not add up and is rambling.  Says that she can't take antidepressants because they make her psychotic.

## 2012-11-10 ENCOUNTER — Emergency Department (HOSPITAL_COMMUNITY)
Admission: EM | Admit: 2012-11-10 | Discharge: 2012-11-10 | Disposition: A | Discharge status: Home / Self Care | Payer: Medicaid Other | Attending: Emergency Medicine | Admitting: Emergency Medicine

## 2012-11-10 ENCOUNTER — Encounter (HOSPITAL_COMMUNITY): Payer: Self-pay | Admitting: Adult Health

## 2012-11-10 DIAGNOSIS — J45909 Unspecified asthma, uncomplicated: Secondary | ICD-10-CM | POA: Insufficient documentation

## 2012-11-10 DIAGNOSIS — F17200 Nicotine dependence, unspecified, uncomplicated: Secondary | ICD-10-CM | POA: Insufficient documentation

## 2012-11-10 DIAGNOSIS — Z862 Personal history of diseases of the blood and blood-forming organs and certain disorders involving the immune mechanism: Secondary | ICD-10-CM | POA: Insufficient documentation

## 2012-11-10 DIAGNOSIS — M51379 Other intervertebral disc degeneration, lumbosacral region without mention of lumbar back pain or lower extremity pain: Secondary | ICD-10-CM | POA: Insufficient documentation

## 2012-11-10 DIAGNOSIS — Z9889 Other specified postprocedural states: Secondary | ICD-10-CM | POA: Insufficient documentation

## 2012-11-10 DIAGNOSIS — R52 Pain, unspecified: Secondary | ICD-10-CM | POA: Insufficient documentation

## 2012-11-10 DIAGNOSIS — M545 Low back pain, unspecified: Secondary | ICD-10-CM | POA: Insufficient documentation

## 2012-11-10 DIAGNOSIS — Z8709 Personal history of other diseases of the respiratory system: Secondary | ICD-10-CM | POA: Insufficient documentation

## 2012-11-10 DIAGNOSIS — M5430 Sciatica, unspecified side: Secondary | ICD-10-CM | POA: Insufficient documentation

## 2012-11-10 DIAGNOSIS — K219 Gastro-esophageal reflux disease without esophagitis: Secondary | ICD-10-CM | POA: Insufficient documentation

## 2012-11-10 DIAGNOSIS — G8929 Other chronic pain: Secondary | ICD-10-CM | POA: Insufficient documentation

## 2012-11-10 DIAGNOSIS — M26609 Unspecified temporomandibular joint disorder, unspecified side: Secondary | ICD-10-CM | POA: Insufficient documentation

## 2012-11-10 DIAGNOSIS — R011 Cardiac murmur, unspecified: Secondary | ICD-10-CM | POA: Insufficient documentation

## 2012-11-10 DIAGNOSIS — I10 Essential (primary) hypertension: Secondary | ICD-10-CM | POA: Insufficient documentation

## 2012-11-10 DIAGNOSIS — M5380 Other specified dorsopathies, site unspecified: Secondary | ICD-10-CM | POA: Insufficient documentation

## 2012-11-10 DIAGNOSIS — IMO0002 Reserved for concepts with insufficient information to code with codable children: Secondary | ICD-10-CM

## 2012-11-10 DIAGNOSIS — M62830 Muscle spasm of back: Secondary | ICD-10-CM

## 2012-11-10 DIAGNOSIS — M5137 Other intervertebral disc degeneration, lumbosacral region: Secondary | ICD-10-CM | POA: Insufficient documentation

## 2012-11-10 DIAGNOSIS — F410 Panic disorder [episodic paroxysmal anxiety] without agoraphobia: Secondary | ICD-10-CM | POA: Insufficient documentation

## 2012-11-10 DIAGNOSIS — Z79899 Other long term (current) drug therapy: Secondary | ICD-10-CM | POA: Insufficient documentation

## 2012-11-10 HISTORY — DX: Pleurisy: R09.1

## 2012-11-10 MED ORDER — HYDROMORPHONE HCL PF 1 MG/ML IJ SOLN
1.0000 mg | Freq: Once | INTRAMUSCULAR | Status: AC
  Administered 2012-11-10: 1 mg via INTRAMUSCULAR
  Filled 2012-11-10: qty 1

## 2012-11-10 MED ORDER — ONDANSETRON 4 MG PO TBDP
8.0000 mg | ORAL_TABLET | Freq: Once | ORAL | Status: AC
  Administered 2012-11-10: 8 mg via ORAL
  Filled 2012-11-10: qty 2

## 2012-11-10 MED ORDER — METHOCARBAMOL 750 MG PO TABS: 750.0000 mg | ORAL_TABLET | Freq: Four times a day (QID) | ORAL | Status: DC | PRN

## 2012-11-10 MED ORDER — OXYCODONE-ACETAMINOPHEN 5-325 MG PO TABS: 1.0000 | ORAL_TABLET | ORAL | Status: DC | PRN

## 2012-11-10 MED ORDER — NAPROXEN 500 MG PO TABS: 500.0000 mg | ORAL_TABLET | Freq: Two times a day (BID) | ORAL | Status: DC | PRN

## 2012-11-10 NOTE — ED Provider Notes (Signed)
History    This chart was scribed for non-physician practitioner Dierdre Forth, PA working with Gilda Crease, * by Quintella Reichert, ED Scribe. This patient was seen in room TR10C/TR10C and the patient's care was started at 11:08 PM .   CSN: [id number]  Arrival date & time 11/10/12  2211     Chief Complaint  Patient presents with  . Back Pain    The history is provided by the patient. No language interpreter was used.    HPI Comments: [name] is a 42 y.o. female with h/o degenerative disk disease who presents to the Emergency Department complaining of constant, sudden-onset, progressively-worsening, severe back pain that began 8 hours ago.  Pt reports that she was bent down on her knees in the closet and her pain began suddenly when she attempted to stand back up from that position.  Back pain is generalized and radiates down the right leg.  She states it is her worst pain since she has been diagnosed with disk disease.  Radiation is described as "it feels like my leg is on fire."  It is greatly exacerbated by standing.  She has attempted to treat pain with ibuprofen and Baclofen, without relief.  She reports that she has had trouble reaching the bathroom in time but denies loss of bladder and bowel control.  She also notes that for the past 2 weeks the soles of her feet near the heels have been so painful that she has had trouble walking.  Pt was diagnosed with degenerative disk disease recently and is not receiving treatment from an orthopedist or spine specialist.  Additional medical history includes HTN, heart murmur, and nerve damage in the lower right leg,     Past Medical History  Diagnosis Date  . Hypertension   . Anxiety   . Panic attack   . TMJ disease     can open mouth wide but jaw pops  . Acid reflux   . Asthma     no inhaler use  . Heart murmur     developed during pregnancy- "leaky valve"  . Anemia   . SVD (spontaneous vaginal delivery)     x  3  . H/O endoscopy     checking for reflux  . Pleurisy     Past Surgical History  Procedure Laterality Date  . Eye surgery    . Dilitation and curettage    . Ankle surgery    . Laparoscopic appendectomy  10/05/2011    Procedure: APPENDECTOMY LAPAROSCOPIC;  Surgeon: Valarie Merino, MD;  Location: [facility] OR;  Service: General;  Laterality: N/A;  . Cholecystectomy  10/12/2011    Procedure: LAPAROSCOPIC CHOLECYSTECTOMY WITH INTRAOPERATIVE CHOLANGIOGRAM;  Surgeon: Wilmon Arms. Corliss Skains, MD;  Location: [facility] OR;  Service: General;  Laterality: N/A;  . Appendectomy    . Dilation and curettage of uterus    . Wisdom tooth extraction    . Laser ablation      Family History  Problem Relation Age of Onset  . Hypertension Other     History  Substance Use Topics  . Smoking status: Current Every Day Smoker -- 1.00 packs/day for 9 years    Types: Cigarettes  . Smokeless tobacco: Never Used  . Alcohol Use: No    OB History   Grav Para Term Preterm Abortions TAB SAB Ect Mult Living   5 3 3  2  2   3        Review of Systems  Constitutional:  Negative for diaphoresis, appetite change, fatigue and unexpected weight change.  HENT: Negative for mouth sores and neck stiffness.   Eyes: Negative for visual disturbance.  Respiratory: Negative for cough, chest tightness, shortness of breath and wheezing.   Gastrointestinal: Negative for nausea, vomiting, diarrhea and constipation.  Endocrine: Negative for polydipsia, polyphagia and polyuria.  Genitourinary: Negative for urgency, frequency and hematuria.  Musculoskeletal: Positive for back pain.  Skin: Negative for rash.  Allergic/Immunologic: Negative for immunocompromised state.  Neurological: Negative for syncope and light-headedness.  Hematological: Does not bruise/bleed easily.  Psychiatric/Behavioral: Negative for sleep disturbance. The patient is not nervous/anxious.       Allergies  Morphine and related; Flagyl; and Paroxetine  Home  Medications   Current Outpatient Rx  Name  Route  Sig  Dispense  Refill  . albuterol (PROVENTIL HFA;VENTOLIN HFA) 108 (90 BASE) MCG/ACT inhaler   Inhalation   Inhale 2 puffs into the lungs every 6 (six) hours as needed for wheezing.   1 Inhaler   3   . ALPRAZolam (XANAX) 1 MG tablet   Oral   Take 1 mg by mouth 2 (two) times daily as needed for anxiety. For anxiety         . esomeprazole (NEXIUM) 20 MG capsule   Oral   Take 1 capsule (20 mg total) by mouth daily.   30 capsule   3   . ibuprofen (ADVIL,MOTRIN) 800 MG tablet   Oral   Take 800 mg by mouth every 8 (eight) hours as needed for pain.         [name] lisinopril-hydrochlorothiazide (PRINZIDE,ZESTORETIC) 10-12.5 MG per tablet   Oral   Take 1 tablet by mouth daily.   30 tablet   4   . methocarbamol (ROBAXIN) 750 MG tablet   Oral   Take 1 tablet (750 mg total) by mouth 4 (four) times daily as needed (Take 1 tablet every 6 hours as needed for muscle spasms.).   20 tablet   0   . naproxen (NAPROSYN) 500 MG tablet   Oral   Take 1 tablet (500 mg total) by mouth 2 (two) times daily as needed.   30 tablet   0   . oxyCODONE-acetaminophen (PERCOCET) 5-325 MG per tablet   Oral   Take 1 tablet by mouth every 4 (four) hours as needed for pain (Take 1- 2 tablets every 4 - 6 hours as needed for pain.).   20 tablet   0    BP 117/65  Pulse 76  Temp(Src) 97.8 F (36.6 C) (Oral)  Resp 20  SpO2 100%  Physical Exam  Nursing note and vitals reviewed. Constitutional: She appears well-developed and well-nourished. No distress.  HENT:  Head: Normocephalic and atraumatic.  Mouth/Throat: Oropharynx is clear and moist. No oropharyngeal exudate.  Eyes: Conjunctivae are normal.  Neck: Normal range of motion. Neck supple.  Full ROM without pain  Cardiovascular: Normal rate, regular rhythm and intact distal pulses.   Pulmonary/Chest: Effort normal and breath sounds normal. No respiratory distress. She has no wheezes.  Abdominal:  Soft. She exhibits no distension. There is no tenderness.  Musculoskeletal: She exhibits tenderness.  L-spine and T-spine bilateral paraspinal tenderness Palpation of the left and right buttock both produce radicular pain Full range of motion of the T-spine and L-spine No tenderness to palpation of the spinous processes of the T-spine or L-spine  Lymphadenopathy:    She has no cervical adenopathy.  Neurological: She is alert. She has normal reflexes.  Speech  is clear and goal oriented, follows commands Normal strength in upper and lower extremities bilaterally including dorsiflexion and plantar flexion, strong and equal grip strength Sensation normal to light and sharp touch Moves extremities without ataxia, coordination intact Normal gait Normal balance   Skin: Skin is warm and dry. No rash noted. She is not diaphoretic. No erythema.    ED Course  Procedures (including critical care time)  DIAGNOSTIC STUDIES: Oxygen Saturation is 100% on room air, normal by my interpretation.    COORDINATION OF CARE: 11:16 PM-Discussed treatment plan which includes pain medication, muscle relaxants, and f/u with orthopedist with pt at bedside and pt agreed to plan.      Labs Reviewed - No data to display  No results found.  1. Back muscle spasm   2. Low back pain   3. Sciatica, unspecified laterality [724.3]   4. Degenerative disc disease     MDM  [name] presents with acute on chronic back pain.  Patient with back pain.  No neurological deficits and normal neuro exam.  Patient can walk but states is painful.  No loss of bowel or bladder control.  No concern for cauda equina.  No fever, night sweats, weight loss, h/o cancer, IVDU.  RICE protocol and pain medicine indicated and discussed with patient. I have also discussed reasons to return immediately to the ER.  Patient expresses understanding and agrees with plan.  I personally performed the services described in this  documentation, which was scribed in my presence. The recorded information has been reviewed and is accurate.    Dahlia Client Nicolus Ose, PA-C 11/10/12 [phone]

## 2012-11-10 NOTE — ED Notes (Signed)
Presents with back pain and sharp pain shooting down right leg that began at 14:30 today after helping a family member move today. Pt reports that it is so sever she can not stand. Nothing makes pain better, moving makes pain worse.

## 2012-11-14 NOTE — ED Provider Notes (Signed)
Medical screening examination/treatment/procedure(s) were performed by non-physician practitioner and as supervising physician I was immediately available for consultation/collaboration.    Baer Hinton J. Robynn Marcel, MD 11/14/12 1538 

## 2012-11-21 ENCOUNTER — Ambulatory Visit: Payer: Medicaid Other

## 2012-11-23 ENCOUNTER — Ambulatory Visit: Payer: Medicaid Other

## 2012-11-29 ENCOUNTER — Emergency Department (HOSPITAL_COMMUNITY)
Admission: EM | Admit: 2012-11-29 | Discharge: 2012-11-30 | Disposition: A | Discharge status: Home / Self Care | Payer: Medicaid Other | Attending: Emergency Medicine | Admitting: Emergency Medicine

## 2012-11-29 ENCOUNTER — Emergency Department (HOSPITAL_COMMUNITY): Payer: Medicaid Other

## 2012-11-29 ENCOUNTER — Encounter (HOSPITAL_COMMUNITY): Payer: Self-pay | Admitting: Physical Medicine and Rehabilitation

## 2012-11-29 DIAGNOSIS — Z79899 Other long term (current) drug therapy: Secondary | ICD-10-CM | POA: Insufficient documentation

## 2012-11-29 DIAGNOSIS — Z3202 Encounter for pregnancy test, result negative: Secondary | ICD-10-CM | POA: Insufficient documentation

## 2012-11-29 DIAGNOSIS — Z8739 Personal history of other diseases of the musculoskeletal system and connective tissue: Secondary | ICD-10-CM | POA: Insufficient documentation

## 2012-11-29 DIAGNOSIS — G8929 Other chronic pain: Secondary | ICD-10-CM | POA: Insufficient documentation

## 2012-11-29 DIAGNOSIS — D649 Anemia, unspecified: Secondary | ICD-10-CM | POA: Insufficient documentation

## 2012-11-29 DIAGNOSIS — F410 Panic disorder [episodic paroxysmal anxiety] without agoraphobia: Secondary | ICD-10-CM | POA: Insufficient documentation

## 2012-11-29 DIAGNOSIS — K219 Gastro-esophageal reflux disease without esophagitis: Secondary | ICD-10-CM | POA: Insufficient documentation

## 2012-11-29 DIAGNOSIS — M549 Dorsalgia, unspecified: Secondary | ICD-10-CM | POA: Insufficient documentation

## 2012-11-29 DIAGNOSIS — Z9889 Other specified postprocedural states: Secondary | ICD-10-CM | POA: Insufficient documentation

## 2012-11-29 DIAGNOSIS — Z885 Allergy status to narcotic agent status: Secondary | ICD-10-CM | POA: Insufficient documentation

## 2012-11-29 DIAGNOSIS — Z888 Allergy status to other drugs, medicaments and biological substances status: Secondary | ICD-10-CM | POA: Insufficient documentation

## 2012-11-29 DIAGNOSIS — R011 Cardiac murmur, unspecified: Secondary | ICD-10-CM | POA: Insufficient documentation

## 2012-11-29 DIAGNOSIS — R1084 Generalized abdominal pain: Secondary | ICD-10-CM | POA: Insufficient documentation

## 2012-11-29 DIAGNOSIS — F411 Generalized anxiety disorder: Secondary | ICD-10-CM | POA: Insufficient documentation

## 2012-11-29 DIAGNOSIS — F17200 Nicotine dependence, unspecified, uncomplicated: Secondary | ICD-10-CM | POA: Insufficient documentation

## 2012-11-29 DIAGNOSIS — I10 Essential (primary) hypertension: Secondary | ICD-10-CM | POA: Insufficient documentation

## 2012-11-29 DIAGNOSIS — R112 Nausea with vomiting, unspecified: Secondary | ICD-10-CM | POA: Insufficient documentation

## 2012-11-29 DIAGNOSIS — J45909 Unspecified asthma, uncomplicated: Secondary | ICD-10-CM | POA: Insufficient documentation

## 2012-11-29 DIAGNOSIS — R197 Diarrhea, unspecified: Secondary | ICD-10-CM | POA: Insufficient documentation

## 2012-11-29 HISTORY — DX: Noninfective gastroenteritis and colitis, unspecified: K52.9

## 2012-11-29 HISTORY — DX: Chronic pain syndrome: G89.4

## 2012-11-29 HISTORY — DX: Dorsalgia, unspecified: M54.9

## 2012-11-29 HISTORY — DX: Unspecified abdominal pain: R10.9

## 2012-11-29 HISTORY — DX: Other chronic pain: G89.29

## 2012-11-29 HISTORY — DX: Nausea: R11.0

## 2012-11-29 LAB — URINALYSIS, ROUTINE W REFLEX MICROSCOPIC
Bilirubin Urine: NEGATIVE
Ketones, ur: NEGATIVE mg/dL
Nitrite: NEGATIVE
Protein, ur: NEGATIVE mg/dL
Urobilinogen, UA: 0.2 mg/dL (ref 0.0–1.0)

## 2012-11-29 LAB — COMPREHENSIVE METABOLIC PANEL
Albumin: 3.8 g/dL (ref 3.5–5.2)
Alkaline Phosphatase: 123 U/L — ABNORMAL HIGH (ref 39–117)
BUN: 9 mg/dL (ref 6–23)
Chloride: 100 mEq/L (ref 96–112)
Creatinine, Ser: 0.71 mg/dL (ref 0.50–1.10)
GFR calc Af Amer: >90 mL/min (ref >90)
GFR calc non Af Amer: >90 mL/min (ref >90)
Glucose, Bld: 115 mg/dL — ABNORMAL HIGH (ref 70–99)
Potassium: 3.1 mEq/L — ABNORMAL LOW (ref 3.5–5.1)
Total Bilirubin: 0.5 mg/dL (ref 0.3–1.2)

## 2012-11-29 LAB — CBC WITH DIFFERENTIAL/PLATELET
Basophils Absolute: 0 10*3/uL (ref 0.0–0.1)
Eosinophils Absolute: 0.1 10*3/uL (ref 0.0–0.7)
Eosinophils Relative: 1 % (ref 0–5)
HCT: 42.2 % (ref 36.0–46.0)
Lymphocytes Relative: 24 % (ref 12–46)
MCH: 26.9 pg (ref 26.0–34.0)
MCHC: 33.9 g/dL (ref 30.0–36.0)
MCV: 79.3 fL (ref 78.0–100.0)
Monocytes Absolute: 0.8 10*3/uL (ref 0.1–1.0)
Platelets: 368 10*3/uL (ref 150–400)
RDW: 15.8 % — ABNORMAL HIGH (ref 11.5–15.5)
WBC: 12 10*3/uL — ABNORMAL HIGH (ref 4.0–10.5)

## 2012-11-29 LAB — URINE MICROSCOPIC-ADD ON

## 2012-11-29 LAB — PREGNANCY, URINE: Preg Test, Ur: NEGATIVE

## 2012-11-29 LAB — LIPASE, BLOOD: Lipase: 22 U/L (ref 11–59)

## 2012-11-29 MED ORDER — SODIUM CHLORIDE 0.9 % IV SOLN
INTRAVENOUS | Status: DC
  Administered 2012-11-29: 1000 mL via INTRAVENOUS

## 2012-11-29 MED ORDER — FAMOTIDINE IN NACL 20-0.9 MG/50ML-% IV SOLN
20.0000 mg | Freq: Once | INTRAVENOUS | Status: AC
  Administered 2012-11-29: 20 mg via INTRAVENOUS
  Filled 2012-11-29: qty 50

## 2012-11-29 MED ORDER — PANTOPRAZOLE SODIUM 40 MG IV SOLR
40.0000 mg | Freq: Once | INTRAVENOUS | Status: AC
  Administered 2012-11-29: 40 mg via INTRAVENOUS
  Filled 2012-11-29: qty 40

## 2012-11-29 MED ORDER — FENTANYL CITRATE 0.05 MG/ML IJ SOLN
50.0000 ug | INTRAMUSCULAR | Status: AC | PRN
  Administered 2012-11-29 (×2): 50 ug via INTRAVENOUS
  Filled 2012-11-29 (×2): qty 2

## 2012-11-29 MED ORDER — ONDANSETRON HCL 4 MG/2ML IJ SOLN
4.0000 mg | INTRAMUSCULAR | Status: AC | PRN
  Administered 2012-11-29 – 2012-11-30 (×2): 4 mg via INTRAVENOUS
  Filled 2012-11-29 (×2): qty 2

## 2012-11-29 MED ORDER — IOHEXOL 300 MG/ML  SOLN
100.0000 mL | Freq: Once | INTRAMUSCULAR | Status: AC | PRN
  Administered 2012-11-29: 100 mL via INTRAVENOUS

## 2012-11-29 MED ORDER — IOHEXOL 300 MG/ML  SOLN
50.0000 mL | Freq: Once | INTRAMUSCULAR | Status: AC | PRN
  Administered 2012-11-29: 50 mL via ORAL

## 2012-11-29 MED ORDER — POTASSIUM CHLORIDE 20 MEQ/15ML (10%) PO LIQD
40.0000 meq | Freq: Once | ORAL | Status: AC
  Administered 2012-11-30: 40 meq via ORAL
  Filled 2012-11-29: qty 30

## 2012-11-29 NOTE — ED Provider Notes (Signed)
History    CSN: [id number] Arrival date & time 11/29/12  1831  First MD Initiated Contact with Patient 11/29/12 1935     Chief Complaint  Patient presents with  . Abdominal Pain   HPI Pt was seen at 1945.   Per pt, c/o gradual onset and persistence of constant acute flair of her chronic abd "pain" for the past 3 days.  Has been associated with multiple intermittent episodes of N/V/D. Describes the diarrhea as "watery." Describes the abd pain as "sharp," and per her usual chronic pain pattern for the past 1+ year. Pt states the pain starts in her epigastric area and radiates down to her entire abd. Worsens when she eats. Denies weight loss. Denies fevers, no back pain, no rash, no CP/SOB, no black or blood in stools or emesis, no recent abx use, no recent travel.  The symptoms have been associated with no other complaints. The patient has a significant history of similar symptoms previously, recently being evaluated for this complaint and multiple prior evals for same by the ED, her PMD and Surgery MD.  Pt did not f/u with GI MD as instructed.   Past Medical History  Diagnosis Date  . Hypertension   . Anxiety   . Panic attack   . TMJ disease     can open mouth wide but jaw pops  . Acid reflux   . Asthma     no inhaler use  . Heart murmur     developed during pregnancy- "leaky valve"  . Anemia   . SVD (spontaneous vaginal delivery)     x 3  . H/O endoscopy     checking for reflux  . Pleurisy   . Chronic abdominal pain   . Chronic nausea   . Chronic diarrhea   . Chronic back pain   . Chronic pain syndrome    Past Surgical History  Procedure Laterality Date  . Eye surgery    . Dilitation and curettage    . Ankle surgery    . Laparoscopic appendectomy  10/05/2011    Procedure: APPENDECTOMY LAPAROSCOPIC;  Surgeon: Valarie Merino, MD;  Location: [facility] OR;  Service: General;  Laterality: N/A;  . Cholecystectomy  10/12/2011    Procedure: LAPAROSCOPIC CHOLECYSTECTOMY WITH  INTRAOPERATIVE CHOLANGIOGRAM;  Surgeon: Wilmon Arms. Corliss Skains, MD;  Location: [facility] OR;  Service: General;  Laterality: N/A;  . Appendectomy    . Dilation and curettage of uterus    . Wisdom tooth extraction    . Laser ablation     Family History  Problem Relation Age of Onset  . Hypertension Other    History  Substance Use Topics  . Smoking status: Current Every Day Smoker -- 1.00 packs/day for 9 years    Types: Cigarettes  . Smokeless tobacco: Never Used  . Alcohol Use: No   OB History   Grav Para Term Preterm Abortions TAB SAB Ect Mult Living   5 3 3  2  2   3      Review of Systems ROS: Statement: All systems negative except as marked or noted in the HPI; Constitutional: Negative for fever and chills. ; ; Eyes: Negative for eye pain, redness and discharge. ; ; ENMT: Negative for ear pain, hoarseness, nasal congestion, sinus pressure and sore throat. ; ; Cardiovascular: Negative for chest pain, palpitations, diaphoresis, dyspnea and peripheral edema. ; ; Respiratory: Negative for cough, wheezing and stridor. ; ; Gastrointestinal: +N/V/D, abd pain. Negative for blood in stool, hematemesis,  jaundice and rectal bleeding. . ; ; Genitourinary: Negative for dysuria, flank pain and hematuria. ; ; Musculoskeletal: Negative for back pain and neck pain. Negative for swelling and trauma.; ; Skin: Negative for pruritus, rash, abrasions, blisters, bruising and skin lesion.; ; Neuro: Negative for headache, lightheadedness and neck stiffness. Negative for weakness, altered level of consciousness , altered mental status, extremity weakness, paresthesias, involuntary movement, seizure and syncope.       Allergies  Morphine and related; Flagyl; Paroxetine; and Vicodin  Home Medications   Current Outpatient Rx  Name  Route  Sig  Dispense  Refill  . albuterol (PROVENTIL HFA;VENTOLIN HFA) 108 (90 BASE) MCG/ACT inhaler   Inhalation   Inhale 2 puffs into the lungs every 6 (six) hours as needed for  wheezing.   1 Inhaler   3   . ALPRAZolam (XANAX) 1 MG tablet   Oral   Take 1 mg by mouth 2 (two) times daily as needed for anxiety. For anxiety         . esomeprazole (NEXIUM) 20 MG capsule   Oral   Take 1 capsule (20 mg total) by mouth daily.   30 capsule   3   . ibuprofen (ADVIL,MOTRIN) 800 MG tablet   Oral   Take 800 mg by mouth every 8 (eight) hours as needed for pain.         [name] lisinopril-hydrochlorothiazide (PRINZIDE,ZESTORETIC) 10-12.5 MG per tablet   Oral   Take 1 tablet by mouth daily.   30 tablet   4   . Lurasidone HCl (LATUDA) 20 MG TABS   Oral   Take 1 tablet by mouth at bedtime.         . methocarbamol (ROBAXIN) 750 MG tablet   Oral   Take 1 tablet (750 mg total) by mouth 4 (four) times daily as needed (Take 1 tablet every 6 hours as needed for muscle spasms.).   20 tablet   0   . naproxen (NAPROSYN) 500 MG tablet   Oral   Take 500 mg by mouth 2 (two) times daily as needed. For pain         . oxyCODONE-acetaminophen (PERCOCET) 5-325 MG per tablet   Oral   Take 1 tablet by mouth every 4 (four) hours as needed for pain (Take 1- 2 tablets every 4 - 6 hours as needed for pain.).   20 tablet   0    BP 112/71  Pulse 108  Temp(Src) 98.1 F (36.7 C) (Oral)  Resp 16  SpO2 99% Physical Exam 1950: Physical examination:  Nursing notes reviewed; Vital signs and O2 SAT reviewed;  Constitutional: Well developed, Well nourished, Well hydrated, In no acute distress; Head:  Normocephalic, atraumatic; Eyes: EOMI, PERRL, No scleral icterus; ENMT: Mouth and pharynx normal, Mucous membranes moist; Neck: Supple, Full range of motion, No lymphadenopathy; Cardiovascular: Regular rate and rhythm, No murmur, rub, or gallop; Respiratory: Breath sounds clear & equal bilaterally, No rales, rhonchi, wheezes.  Speaking full sentences with ease, Normal respiratory effort/excursion; Chest: Nontender, Movement normal; Abdomen: Soft, +diffuse tenderness to palp. No rebound or  guarding. Nondistended, Normal bowel sounds; Genitourinary: No CVA tenderness. Pelvic exam performed with permission of pt and female ED tech assist during exam.  External genitalia w/o lesions. Vaginal vault with thick white discharge.  Cervix w/o lesions, not friable, GC/chlam and wet prep obtained and sent to lab.  Bimanual exam w/o CMT or adnexal tenderness. +mild suprapubic tenderness to palp.;;; Extremities: Pulses normal, No tenderness, No  edema, No calf edema or asymmetry.; Neuro: AA&Ox3, Major CN grossly intact.  Speech clear. Climbs on and off stretcher easily by herself. Gait steady and upright. No gross focal motor or sensory deficits in extremities.; Skin: Color normal, Warm, Dry.   ED Course  Procedures     MDM  MDM Reviewed: previous chart, nursing note and vitals Reviewed previous: labs Interpretation: labs, x-ray, CT scan and ultrasound   Results for orders placed during the hospital encounter of 11/29/12  CBC WITH DIFFERENTIAL      Result Value Range   WBC 12.0 (*) 4.0 - 10.5 K/uL   RBC 5.32 (*) 3.87 - 5.11 MIL/uL   Hemoglobin 14.3  12.0 - 15.0 g/dL   HCT 40.9  81.1 - 91.4 %   MCV 79.3  78.0 - 100.0 fL   MCH 26.9  26.0 - 34.0 pg   MCHC 33.9  30.0 - 36.0 g/dL   RDW 78.2 (*) 95.6 - 21.3 %   Platelets 368  150 - 400 K/uL   Neutrophils Relative % 68  43 - 77 %   Neutro Abs 8.2 (*) 1.7 - 7.7 K/uL   Lymphocytes Relative 24  12 - 46 %   Lymphs Abs 2.9  0.7 - 4.0 K/uL   Monocytes Relative 7  3 - 12 %   Monocytes Absolute 0.8  0.1 - 1.0 K/uL   Eosinophils Relative 1  0 - 5 %   Eosinophils Absolute 0.1  0.0 - 0.7 K/uL   Basophils Relative 0  0 - 1 %   Basophils Absolute 0.0  0.0 - 0.1 K/uL  COMPREHENSIVE METABOLIC PANEL      Result Value Range   Sodium 138  135 - 145 mEq/L   Potassium 3.1 (*) 3.5 - 5.1 mEq/L   Chloride 100  96 - 112 mEq/L   CO2 27  19 - 32 mEq/L   Glucose, Bld 115 (*) 70 - 99 mg/dL   BUN 9  6 - 23 mg/dL   Creatinine, Ser 0.86  0.50 - 1.10 mg/dL    Calcium 9.5  8.4 - 57.8 mg/dL   Total Protein 7.7  6.0 - 8.3 g/dL   Albumin 3.8  3.5 - 5.2 g/dL   AST 20  0 - 37 U/L   ALT 14  0 - 35 U/L   Alkaline Phosphatase 123 (*) 39 - 117 U/L   Total Bilirubin 0.5  0.3 - 1.2 mg/dL   GFR calc non Af Amer >90  >90 mL/min   GFR calc Af Amer >90  >90 mL/min  LIPASE, BLOOD      Result Value Range   Lipase 22  11 - 59 U/L  URINALYSIS, ROUTINE W REFLEX MICROSCOPIC      Result Value Range   Color, Urine AMBER (*) YELLOW   APPearance CLOUDY (*) CLEAR   Specific Gravity, Urine 1.016  1.005 - 1.030   pH 6.0  5.0 - 8.0   Glucose, UA NEGATIVE  NEGATIVE mg/dL   Hgb urine dipstick NEGATIVE  NEGATIVE   Bilirubin Urine NEGATIVE  NEGATIVE   Ketones, ur NEGATIVE  NEGATIVE mg/dL   Protein, ur NEGATIVE  NEGATIVE mg/dL   Urobilinogen, UA 0.2  0.0 - 1.0 mg/dL   Nitrite NEGATIVE  NEGATIVE   Leukocytes, UA MODERATE (*) NEGATIVE  URINE MICROSCOPIC-ADD ON      Result Value Range   Squamous Epithelial / LPF MANY (*) RARE   WBC, UA 3-6  <3 WBC/hpf  Bacteria, UA MANY (*) RARE   Urine-Other MUCOUS PRESENT    PREGNANCY, URINE      Result Value Range   Preg Test, Ur NEGATIVE  NEGATIVE   Dg Chest 2 View 11/29/2012   *RADIOLOGY REPORT*  Clinical Data: Chest pain.  Upper abdominal pain.  Dizziness and weakness.  CHEST - 2 VIEW  Comparison: 12/20/2011  Findings: Cardiac and mediastinal contours appear normal.  The lungs appear clear.  No pleural effusion is identified.  Thoracic spondylosis noted.  IMPRESSION:  1.  Thoracic spondylosis.   Otherwise, no significant abnormality identified.   Original Report Authenticated By: Gaylyn Rong, M.D.   Ct Abdomen Pelvis W Contrast 11/29/2012   *RADIOLOGY REPORT*  Clinical Data: Abdominal pain with nausea.  CT ABDOMEN AND PELVIS WITH CONTRAST  Technique:  Multidetector CT imaging of the abdomen and pelvis was performed following the standard protocol during bolus administration of intravenous contrast.  Contrast: OMNIPAQUE  IOHEXOL 300 MG/ML  SOLN  Comparison: 10/18/2011  Findings: Lung bases are clear.  There is a punctate pleural-based density in the right lower lobe on image 8 which is unchanged.  No evidence for free air.  There is a periumbilical hernia containing fat.  Normal appearance of the liver, portal venous system, pancreas, spleen, adrenal glands and both kidneys.  No significant free fluid or lymphadenopathy.  No gross abnormality to the uterus.  There is fluid in the urinary bladder.  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 No acute bony abnormality.  Evidence for an appendectomy. There is a 2.5 cm round structure involving the right adnexa.  The structure is slightly dense and could represent a complex cyst or follicle.  IMPRESSION: No acute abnormalities within the abdomen and pelvis.  Periumbilical hernia.  2.5 cm round structure associated with the right adnexa.  Findings are nonspecific on CT.  Findings could represent complex or hemorrhagic small cyst.  If this is an area of concern, recommend further evaluation with ultrasound.   Original Report Authenticated By: Richarda Overlie, M.D.   [place] Art/ven Flow Abd Pelv Doppler 11/29/2012   *RADIOLOGY REPORT*  Clinical Data: Pelvic pain history of ovarian cysts  TRANSABDOMINAL AND TRANSVAGINAL ULTRASOUND OF PELVIS Technique:  Both transabdominal and transvaginal ultrasound examinations of the pelvis were performed. Transabdominal technique was performed for global imaging of the pelvis including uterus, ovaries, adnexal regions, and pelvic cul-de-sac.  It was necessary to proceed with endovaginal exam following the transabdominal exam to visualize the use, adnexa and ovaries in better detail.  Comparison:  11/29/2012 CT  Findings:  Uterus: Normal in size and appearance.  Uterus measures 9.5 x 3.8 x 5.7 cm.  Endometrium: Limited visualization.  9.1 mm thickness.  No gross abnormality.  Right ovary:  Normal appearance.  Right ovary measures 3.3 x 2.5 x 2.9 cm.  Right ovary contains  a 1.2 x 1.5 x 1.4 cm dominant follicle versus small cyst.  Left ovary: Grossly normal appearance.  Left ovary measures 2.0 x 1.7 x 1.3 cm.  Other findings: No free fluid  IMPRESSION: No acute finding by ultrasound.  1.5 cm right ovarian dominant follicle or cyst.  DOPPLER ULTRASOUND OF OVARIES  Technique:  Color and duplex Doppler ultrasound was utilized to evaluate blood flow to the ovaries.  Comparison:  None.  Findings: Normal arterial and venous flow detected to both ovaries  IMPRESSION: Normal pelvic ultrasound Doppler evaluation.   Original Report Authenticated By: Judie Petit. Miles Costain, M.D.    Results for orders placed during the hospital encounter  of 11/29/12  WET PREP, GENITAL      Result Value Range   Yeast Wet Prep HPF POC NONE SEEN  NONE SEEN   Trich, Wet Prep NONE SEEN  NONE SEEN   Clue Cells Wet Prep HPF POC NONE SEEN  NONE SEEN   WBC, Wet Prep HPF POC MODERATE (*) NONE SEEN  CBC WITH DIFFERENTIAL      Result Value Range   WBC 12.0 (*) 4.0 - 10.5 K/uL   RBC 5.32 (*) 3.87 - 5.11 MIL/uL   Hemoglobin 14.3  12.0 - 15.0 g/dL   HCT 16.1  09.6 - 04.5 %   MCV 79.3  78.0 - 100.0 fL   MCH 26.9  26.0 - 34.0 pg   MCHC 33.9  30.0 - 36.0 g/dL   RDW 40.9 (*) 81.1 - 91.4 %   Platelets 368  150 - 400 K/uL   Neutrophils Relative % 68  43 - 77 %   Neutro Abs 8.2 (*) 1.7 - 7.7 K/uL   Lymphocytes Relative 24  12 - 46 %   Lymphs Abs 2.9  0.7 - 4.0 K/uL   Monocytes Relative 7  3 - 12 %   Monocytes Absolute 0.8  0.1 - 1.0 K/uL   Eosinophils Relative 1  0 - 5 %   Eosinophils Absolute 0.1  0.0 - 0.7 K/uL   Basophils Relative 0  0 - 1 %   Basophils Absolute 0.0  0.0 - 0.1 K/uL  COMPREHENSIVE METABOLIC PANEL      Result Value Range   Sodium 138  135 - 145 mEq/L   Potassium 3.1 (*) 3.5 - 5.1 mEq/L   Chloride 100  96 - 112 mEq/L   CO2 27  19 - 32 mEq/L   Glucose, Bld 115 (*) 70 - 99 mg/dL   BUN 9  6 - 23 mg/dL   Creatinine, Ser 7.82  0.50 - 1.10 mg/dL   Calcium 9.5  8.4 - 95.6 mg/dL   Total Protein 7.7   6.0 - 8.3 g/dL   Albumin 3.8  3.5 - 5.2 g/dL   AST 20  0 - 37 U/L   ALT 14  0 - 35 U/L   Alkaline Phosphatase 123 (*) 39 - 117 U/L   Total Bilirubin 0.5  0.3 - 1.2 mg/dL   GFR calc non Af Amer >90  >90 mL/min   GFR calc Af Amer >90  >90 mL/min  LIPASE, BLOOD      Result Value Range   Lipase 22  11 - 59 U/L  URINALYSIS, ROUTINE W REFLEX MICROSCOPIC      Result Value Range   Color, Urine AMBER (*) YELLOW   APPearance CLOUDY (*) CLEAR   Specific Gravity, Urine 1.016  1.005 - 1.030   pH 6.0  5.0 - 8.0   Glucose, UA NEGATIVE  NEGATIVE mg/dL   Hgb urine dipstick NEGATIVE  NEGATIVE   Bilirubin Urine NEGATIVE  NEGATIVE   Ketones, ur NEGATIVE  NEGATIVE mg/dL   Protein, ur NEGATIVE  NEGATIVE mg/dL   Urobilinogen, UA 0.2  0.0 - 1.0 mg/dL   Nitrite NEGATIVE  NEGATIVE   Leukocytes, UA MODERATE (*) NEGATIVE  URINE MICROSCOPIC-ADD ON      Result Value Range   Squamous Epithelial / LPF MANY (*) RARE   WBC, UA 3-6  <3 WBC/hpf   Bacteria, UA MANY (*) RARE   Urine-Other MUCOUS PRESENT    PREGNANCY, URINE      Result Value Range  Preg Test, Ur NEGATIVE  NEGATIVE   Dg Chest 2 View 11/29/2012   *RADIOLOGY REPORT*  Clinical Data: Chest pain.  Upper abdominal pain.  Dizziness and weakness.  CHEST - 2 VIEW  Comparison: 12/20/2011  Findings: Cardiac and mediastinal contours appear normal.  The lungs appear clear.  No pleural effusion is identified.  Thoracic spondylosis noted.  IMPRESSION:  1.  Thoracic spondylosis.   Otherwise, no significant abnormality identified.   Original Report Authenticated By: Gaylyn Rong, M.D.   Ct Abdomen Pelvis W Contrast 11/29/2012   *RADIOLOGY REPORT*  Clinical Data: Abdominal pain with nausea.  CT ABDOMEN AND PELVIS WITH CONTRAST  Technique:  Multidetector CT imaging of the abdomen and pelvis was performed following the standard protocol during bolus administration of intravenous contrast.  Contrast: OMNIPAQUE IOHEXOL 300 MG/ML  SOLN  Comparison: 10/18/2011   Findings: Lung bases are clear.  There is a punctate pleural-based density in the right lower lobe on image 8 which is unchanged.  No evidence for free air.  There is a periumbilical hernia containing fat.  Normal appearance of the liver, portal venous system, pancreas, spleen, adrenal glands and both kidneys.  No significant free fluid or lymphadenopathy.  No gross abnormality to the uterus.  There is fluid in the urinary bladder.  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 No acute bony abnormality.  Evidence for an appendectomy. There is a 2.5 cm round structure involving the right adnexa.  The structure is slightly dense and could represent a complex cyst or follicle.  IMPRESSION: No acute abnormalities within the abdomen and pelvis.  Periumbilical hernia.  2.5 cm round structure associated with the right adnexa.  Findings are nonspecific on CT.  Findings could represent complex or hemorrhagic small cyst.  If this is an area of concern, recommend further evaluation with ultrasound.   Original Report Authenticated By: Richarda Overlie, M.D.   [place] Art/ven Flow Abd Pelv Doppler 11/29/2012   *RADIOLOGY REPORT*  Clinical Data: Pelvic pain history of ovarian cysts  TRANSABDOMINAL AND TRANSVAGINAL ULTRASOUND OF PELVIS Technique:  Both transabdominal and transvaginal ultrasound examinations of the pelvis were performed. Transabdominal technique was performed for global imaging of the pelvis including uterus, ovaries, adnexal regions, and pelvic cul-de-sac.  It was necessary to proceed with endovaginal exam following the transabdominal exam to visualize the use, adnexa and ovaries in better detail.  Comparison:  11/29/2012 CT  Findings:  Uterus: Normal in size and appearance.  Uterus measures 9.5 x 3.8 x 5.7 cm.  Endometrium: Limited visualization.  9.1 mm thickness.  No gross abnormality.  Right ovary:  Normal appearance.  Right ovary measures 3.3 x 2.5 x 2.9 cm.  Right ovary contains a 1.2 x 1.5 x 1.4 cm dominant follicle versus  small cyst.  Left ovary: Grossly normal appearance.  Left ovary measures 2.0 x 1.7 x 1.3 cm.  Other findings: No free fluid  IMPRESSION: No acute finding by ultrasound.  1.5 cm right ovarian dominant follicle or cyst.  DOPPLER ULTRASOUND OF OVARIES  Technique:  Color and duplex Doppler ultrasound was utilized to evaluate blood flow to the ovaries.  Comparison:  None.  Findings: Normal arterial and venous flow detected to both ovaries  IMPRESSION: Normal pelvic ultrasound Doppler evaluation.   Original Report Authenticated By: Judie Petit. Shick, M.D.    Lenice PressmanSilvestre Gunner to be acute flair of her chronic abd pain today.  Pt has tol PO well while in the ED without N/V.  No stooling while in the ED.  Abd benign, VSS. Ambulatory with steady upright gait. Requesting "something else for pain" several times. Made aware that I was giving her the most potent narcotic pain medication available (fentanyl). Continues to request "something else." Aware she will continue to receive IV fentanyl for her pain, as giving her a less potent narcotic pain medication does not make good sense. Verb understanding. Pt wants to go home now. [place] Controlled Substance Database accessed:  Pt with 3 percocet rx written by 2 different providers in the past 1 month, as well as suboxone (#60 tabs) rx by a different provider in 09/2012 and 10/2012; last oxycodone/APAP 10-325mg  tabs #30 rx by Dr. Pati Gallo filled on 11/23/12.  Pt made aware that I will not be rx her any narcotic pain meds; verb understanding. Dx and testing d/w pt.  Questions answered.  Verb understanding, agreeable to d/c home with outpt f/u.      Laray Anger, DO 12/02/12 0117

## 2012-11-29 NOTE — ED Notes (Signed)
Pt presents to department for evaluation of diffuse abdominal pain. Describes as pain to upper abdomen radiating down to lower, states sharp constant pain since Saturday. Also states N/V/D and fatigue. 10/10 pain at the time. Pt is conscious alert and oriented x4.

## 2012-11-30 LAB — WET PREP, GENITAL: Clue Cells Wet Prep HPF POC: NONE SEEN

## 2012-11-30 LAB — GC/CHLAMYDIA PROBE AMP: GC Probe RNA: NEGATIVE

## 2012-11-30 MED ORDER — FENTANYL CITRATE 0.05 MG/ML IJ SOLN
100.0000 ug | INTRAMUSCULAR | Status: AC
  Administered 2012-11-30: 100 ug via INTRAVENOUS
  Filled 2012-11-30: qty 2

## 2012-11-30 MED ORDER — ONDANSETRON HCL 4 MG PO TABS: 4.0000 mg | ORAL_TABLET | Freq: Three times a day (TID) | ORAL | Status: DC | PRN

## 2012-12-01 LAB — URINE CULTURE
Colony Count: NO GROWTH
Culture: NO GROWTH

## 2013-01-03 ENCOUNTER — Encounter (HOSPITAL_COMMUNITY): Payer: Self-pay | Admitting: Emergency Medicine

## 2013-01-03 ENCOUNTER — Emergency Department (HOSPITAL_COMMUNITY)
Admission: EM | Admit: 2013-01-03 | Discharge: 2013-01-03 | Discharge status: Against Medical Advice | Payer: Medicaid Other | Attending: Emergency Medicine | Admitting: Emergency Medicine

## 2013-01-03 DIAGNOSIS — Y939 Activity, unspecified: Secondary | ICD-10-CM | POA: Insufficient documentation

## 2013-01-03 DIAGNOSIS — Y92410 Unspecified street and highway as the place of occurrence of the external cause: Secondary | ICD-10-CM | POA: Insufficient documentation

## 2013-01-03 DIAGNOSIS — S0993XA Unspecified injury of face, initial encounter: Secondary | ICD-10-CM | POA: Insufficient documentation

## 2013-01-03 DIAGNOSIS — IMO0002 Reserved for concepts with insufficient information to code with codable children: Secondary | ICD-10-CM | POA: Insufficient documentation
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
## 2013-01-31 ENCOUNTER — Emergency Department (HOSPITAL_COMMUNITY): Payer: Medicaid Other

## 2013-01-31 ENCOUNTER — Emergency Department (HOSPITAL_COMMUNITY)
Admission: EM | Admit: 2013-01-31 | Discharge: 2013-01-31 | Disposition: A | Discharge status: Home / Self Care | Payer: Medicaid Other | Attending: Emergency Medicine | Admitting: Emergency Medicine

## 2013-01-31 ENCOUNTER — Encounter (HOSPITAL_COMMUNITY): Payer: Self-pay | Admitting: *Deleted

## 2013-01-31 DIAGNOSIS — Z79899 Other long term (current) drug therapy: Secondary | ICD-10-CM | POA: Insufficient documentation

## 2013-01-31 DIAGNOSIS — Z862 Personal history of diseases of the blood and blood-forming organs and certain disorders involving the immune mechanism: Secondary | ICD-10-CM | POA: Insufficient documentation

## 2013-01-31 DIAGNOSIS — K219 Gastro-esophageal reflux disease without esophagitis: Secondary | ICD-10-CM | POA: Insufficient documentation

## 2013-01-31 DIAGNOSIS — R011 Cardiac murmur, unspecified: Secondary | ICD-10-CM | POA: Insufficient documentation

## 2013-01-31 DIAGNOSIS — Y929 Unspecified place or not applicable: Secondary | ICD-10-CM | POA: Insufficient documentation

## 2013-01-31 DIAGNOSIS — M79672 Pain in left foot: Secondary | ICD-10-CM

## 2013-01-31 DIAGNOSIS — I10 Essential (primary) hypertension: Secondary | ICD-10-CM | POA: Insufficient documentation

## 2013-01-31 DIAGNOSIS — R059 Cough, unspecified: Secondary | ICD-10-CM | POA: Insufficient documentation

## 2013-01-31 DIAGNOSIS — J45901 Unspecified asthma with (acute) exacerbation: Secondary | ICD-10-CM | POA: Insufficient documentation

## 2013-01-31 DIAGNOSIS — F411 Generalized anxiety disorder: Secondary | ICD-10-CM | POA: Insufficient documentation

## 2013-01-31 DIAGNOSIS — S8990XA Unspecified injury of unspecified lower leg, initial encounter: Secondary | ICD-10-CM | POA: Insufficient documentation

## 2013-01-31 DIAGNOSIS — Y9389 Activity, other specified: Secondary | ICD-10-CM | POA: Insufficient documentation

## 2013-01-31 DIAGNOSIS — F17200 Nicotine dependence, unspecified, uncomplicated: Secondary | ICD-10-CM | POA: Insufficient documentation

## 2013-01-31 DIAGNOSIS — M722 Plantar fascial fibromatosis: Secondary | ICD-10-CM

## 2013-01-31 DIAGNOSIS — R05 Cough: Secondary | ICD-10-CM | POA: Insufficient documentation

## 2013-01-31 DIAGNOSIS — X500XXA Overexertion from strenuous movement or load, initial encounter: Secondary | ICD-10-CM | POA: Insufficient documentation

## 2013-01-31 MED ORDER — PREDNISONE 20 MG PO TABS: ORAL_TABLET | ORAL | Status: DC

## 2013-01-31 MED ORDER — IBUPROFEN 800 MG PO TABS: 800.0000 mg | ORAL_TABLET | Freq: Three times a day (TID) | ORAL | Status: DC

## 2013-01-31 MED ORDER — ALBUTEROL SULFATE (5 MG/ML) 0.5% IN NEBU
5.0000 mg | INHALATION_SOLUTION | Freq: Once | RESPIRATORY_TRACT | Status: AC
  Administered 2013-01-31: 5 mg via RESPIRATORY_TRACT
  Filled 2013-01-31: qty 1

## 2013-01-31 MED ORDER — NAPROXEN 250 MG PO TABS: 500.0000 mg | ORAL_TABLET | Freq: Once | ORAL | Status: DC

## 2013-01-31 MED ORDER — PREDNISONE 20 MG PO TABS
60.0000 mg | ORAL_TABLET | Freq: Once | ORAL | Status: AC
  Administered 2013-01-31: 60 mg via ORAL
  Filled 2013-01-31: qty 3

## 2013-01-31 NOTE — ED Provider Notes (Signed)
CSN: [id number]     Arrival date & time 01/31/13  1330 History   First MD Initiated Contact with Patient 01/31/13 1505     Chief Complaint  Patient presents with  . Foot Pain  . Asthma   (Consider location/radiation/quality/duration/timing/severity/associated sxs/prior Treatment) HPI Comments: 42 year old female with a past medical history of chronic pain and asthma presents to the emergency department complaining of left heel pain x4 days. Patient states this weekend she was stepping off of her porch and stepped onto her left heel wrong and felt a sharp pain, has had constant pain since. Pain worse with pressure and walking, somewhat relieved by rest. She has tried taking over-the-counter Tylenol and Motrin without relief. Pain described as sharp and stabbing rated 10 out of 10 with pressure, 7/10 at rest. Admits to some numbness in her foot earlier today which has subsided. Also complaining of a nonproductive cough x2 weeks with associated wheezing. She has tried using her inhalers without relief. Denies fever, chills, chest pain.  Patient is a 42 y.o. female presenting with lower extremity pain and asthma. The history is provided by the patient.  Foot Pain Associated symptoms include numbness. Pertinent negatives include no chest pain, chills, congestion or fever.  Asthma Associated symptoms include numbness. Pertinent negatives include no chest pain, chills, congestion or fever.    Past Medical History  Diagnosis Date  . Hypertension   . Anxiety   . Panic attack   . TMJ disease     can open mouth wide but jaw pops  . Acid reflux   . Asthma     no inhaler use  . Heart murmur     developed during pregnancy- "leaky valve"  . Anemia   . SVD (spontaneous vaginal delivery)     x 3  . H/O endoscopy     checking for reflux  . Pleurisy   . Chronic abdominal pain   . Chronic nausea   . Chronic diarrhea   . Chronic back pain   . Chronic pain syndrome    Past Surgical History   Procedure Laterality Date  . Eye surgery    . Dilitation and curettage    . Ankle surgery    . Laparoscopic appendectomy  10/05/2011    Procedure: APPENDECTOMY LAPAROSCOPIC;  Surgeon: Valarie Merino, MD;  Location: [facility] OR;  Service: General;  Laterality: N/A;  . Cholecystectomy  10/12/2011    Procedure: LAPAROSCOPIC CHOLECYSTECTOMY WITH INTRAOPERATIVE CHOLANGIOGRAM;  Surgeon: Wilmon Arms. Corliss Skains, MD;  Location: [facility] OR;  Service: General;  Laterality: N/A;  . Appendectomy    . Dilation and curettage of uterus    . Wisdom tooth extraction    . Laser ablation     Family History  Problem Relation Age of Onset  . Hypertension Other    History  Substance Use Topics  . Smoking status: Current Every Day Smoker -- 1.00 packs/day for 9 years    Types: Cigarettes  . Smokeless tobacco: Never Used  . Alcohol Use: No   OB History   Grav Para Term Preterm Abortions TAB SAB Ect Mult Living   5 3 3  2  2   3      Review of Systems  Constitutional: Negative for fever and chills.  HENT: Negative for congestion.   Respiratory: Positive for choking and wheezing.   Cardiovascular: Negative for chest pain.  Musculoskeletal: Negative for back pain.       Positive for left heel pain.  Neurological: Positive for  numbness.  All other systems reviewed and are negative.    Allergies  Morphine and related; Flagyl; Paroxetine; and Vicodin  Home Medications   Current Outpatient Rx  Name  Route  Sig  Dispense  Refill  . albuterol (PROVENTIL HFA;VENTOLIN HFA) 108 (90 BASE) MCG/ACT inhaler   Inhalation   Inhale 2 puffs into the lungs every 6 (six) hours as needed for wheezing.   1 Inhaler   3   . ALPRAZolam (XANAX) 1 MG tablet   Oral   Take 1 mg by mouth 2 (two) times daily as needed for anxiety. For anxiety         . esomeprazole (NEXIUM) 20 MG capsule   Oral   Take 1 capsule (20 mg total) by mouth daily.   30 capsule   3   . HYDROcodone-acetaminophen (NORCO/VICODIN) 5-325 MG per tablet    Oral   Take 1 tablet by mouth every 6 (six) hours as needed for pain.         [name] lisinopril-hydrochlorothiazide (PRINZIDE,ZESTORETIC) 10-12.5 MG per tablet   Oral   Take 1 tablet by mouth daily.   30 tablet   4   . Lurasidone HCl (LATUDA) 20 MG TABS   Oral   Take 1 tablet by mouth at bedtime.          BP 99/62  Pulse 95  Temp(Src) 98.6 F (37 C) (Oral)  Resp 24  SpO2 95% Physical Exam  Nursing note and vitals reviewed. Constitutional: She is oriented to person, place, and time. She appears well-developed and well-nourished. No distress.  HENT:  Head: Normocephalic and atraumatic.  Mouth/Throat: Oropharynx is clear and moist.  Eyes: Conjunctivae are normal.  Neck: Normal range of motion. Neck supple.  Cardiovascular: Normal rate, regular rhythm and normal heart sounds.   Pulmonary/Chest: Effort normal. She has no decreased breath sounds. She has wheezes (Scattered end expiratory).  Musculoskeletal: Normal range of motion. She exhibits no edema.  Tender to palpation of calcaneus. No deformity. Full range of motion of ankle and foot. No bruising or erythema. Achilles tendon intact. Negative Thompson test.  Lymphadenopathy:    She has no cervical adenopathy.  Neurological: She is alert and oriented to person, place, and time. She has normal strength. No sensory deficit.  Skin: Skin is warm, dry and intact. No bruising and no ecchymosis noted. She is not diaphoretic.  Psychiatric: She has a normal mood and affect. Her behavior is normal.    ED Course  Procedures (including critical care time) Labs Review Labs Reviewed - No data to display Imaging Review Dg Ankle Complete Left  01/31/2013   *RADIOLOGY REPORT*  Clinical Data: Left heel pain with weightbearing  LEFT ANKLE COMPLETE - 3+ VIEW  Comparison: 09/14/2011  Findings: Mild degenerative changes are noted in the of the tibiotalar articulation.  A plantar spur is noted arising from the calcaneus.  There are changes  consistent with prior fracture of the fifth manner tarsal.  Mild tarsal degenerative changes are seen.  IMPRESSION: Chronic changes without acute abnormality.   Original Report Authenticated By: Alcide Clever, M.D.    MDM   1. Plantar fasciitis   2. Foot pain, left   3. Asthma exacerbation     Patient with heel pain, cough and wheezing. No obvious injury to left heel. Will obtain x-ray for further evaluation. For wheezing, will give nebulizer treatments and prednisone.  5:07 PM Patient reports improvement after nebulizer treatment. Lungs clinically improved. Regarding heel pain, heel spur  is noted, symptoms may be related to plantar fasciitis. Conservative measures discussed for this. She'll be discharged home with a short course of steroids for her asthma exacerbation. Advised stretching and NSAIDs for heel pain. Return precautions discussed. Patient states understanding of plan and is agreeable.  Trevor Mace, PA-C 01/31/13 1708

## 2013-01-31 NOTE — ED Notes (Signed)
Pt reports stepping off porch this weekend and now having pain to left heel since then. No obv injury noted. Also having cough x 2 weeks, increase in asthma and no relief with inhalers. Airway intact.

## 2013-01-31 NOTE — ED Notes (Signed)
PA at bedside.

## 2013-01-31 NOTE — ED Provider Notes (Signed)
Medical screening examination/treatment/procedure(s) were performed by non-physician practitioner and as supervising physician I was immediately available for consultation/collaboration.  Maricus Tanzi, MD 01/31/13 2236 

## 2013-02-17 ENCOUNTER — Ambulatory Visit: Payer: Medicaid Other

## 2013-02-18 ENCOUNTER — Other Ambulatory Visit: Payer: Self-pay | Admitting: Family Medicine

## 2013-02-20 NOTE — Telephone Encounter (Signed)
Refill nexium 

## 2013-04-04 ENCOUNTER — Emergency Department (HOSPITAL_COMMUNITY): Payer: Medicaid Other

## 2013-04-04 ENCOUNTER — Encounter (HOSPITAL_COMMUNITY): Payer: Self-pay | Admitting: Emergency Medicine

## 2013-04-04 ENCOUNTER — Emergency Department (HOSPITAL_COMMUNITY)
Admission: EM | Admit: 2013-04-04 | Discharge: 2013-04-04 | Disposition: A | Discharge status: Home / Self Care | Payer: Medicaid Other | Attending: Emergency Medicine | Admitting: Emergency Medicine

## 2013-04-04 DIAGNOSIS — R110 Nausea: Secondary | ICD-10-CM | POA: Insufficient documentation

## 2013-04-04 DIAGNOSIS — Z888 Allergy status to other drugs, medicaments and biological substances status: Secondary | ICD-10-CM | POA: Insufficient documentation

## 2013-04-04 DIAGNOSIS — Z9889 Other specified postprocedural states: Secondary | ICD-10-CM | POA: Insufficient documentation

## 2013-04-04 DIAGNOSIS — M549 Dorsalgia, unspecified: Secondary | ICD-10-CM | POA: Insufficient documentation

## 2013-04-04 DIAGNOSIS — N938 Other specified abnormal uterine and vaginal bleeding: Secondary | ICD-10-CM | POA: Insufficient documentation

## 2013-04-04 DIAGNOSIS — F410 Panic disorder [episodic paroxysmal anxiety] without agoraphobia: Secondary | ICD-10-CM | POA: Insufficient documentation

## 2013-04-04 DIAGNOSIS — R011 Cardiac murmur, unspecified: Secondary | ICD-10-CM | POA: Insufficient documentation

## 2013-04-04 DIAGNOSIS — R197 Diarrhea, unspecified: Secondary | ICD-10-CM | POA: Insufficient documentation

## 2013-04-04 DIAGNOSIS — I10 Essential (primary) hypertension: Secondary | ICD-10-CM | POA: Insufficient documentation

## 2013-04-04 DIAGNOSIS — F411 Generalized anxiety disorder: Secondary | ICD-10-CM | POA: Insufficient documentation

## 2013-04-04 DIAGNOSIS — J45909 Unspecified asthma, uncomplicated: Secondary | ICD-10-CM | POA: Insufficient documentation

## 2013-04-04 DIAGNOSIS — Z885 Allergy status to narcotic agent status: Secondary | ICD-10-CM | POA: Insufficient documentation

## 2013-04-04 DIAGNOSIS — Z8709 Personal history of other diseases of the respiratory system: Secondary | ICD-10-CM | POA: Insufficient documentation

## 2013-04-04 DIAGNOSIS — Z79899 Other long term (current) drug therapy: Secondary | ICD-10-CM | POA: Insufficient documentation

## 2013-04-04 DIAGNOSIS — D649 Anemia, unspecified: Secondary | ICD-10-CM | POA: Insufficient documentation

## 2013-04-04 DIAGNOSIS — K219 Gastro-esophageal reflux disease without esophagitis: Secondary | ICD-10-CM | POA: Insufficient documentation

## 2013-04-04 DIAGNOSIS — J189 Pneumonia, unspecified organism: Secondary | ICD-10-CM

## 2013-04-04 DIAGNOSIS — F17200 Nicotine dependence, unspecified, uncomplicated: Secondary | ICD-10-CM | POA: Insufficient documentation

## 2013-04-04 DIAGNOSIS — R109 Unspecified abdominal pain: Secondary | ICD-10-CM | POA: Insufficient documentation

## 2013-04-04 DIAGNOSIS — R509 Fever, unspecified: Secondary | ICD-10-CM | POA: Insufficient documentation

## 2013-04-04 DIAGNOSIS — N949 Unspecified condition associated with female genital organs and menstrual cycle: Secondary | ICD-10-CM | POA: Insufficient documentation

## 2013-04-04 DIAGNOSIS — Z8739 Personal history of other diseases of the musculoskeletal system and connective tissue: Secondary | ICD-10-CM | POA: Insufficient documentation

## 2013-04-04 DIAGNOSIS — G8929 Other chronic pain: Secondary | ICD-10-CM | POA: Insufficient documentation

## 2013-04-04 LAB — CBC WITH DIFFERENTIAL/PLATELET
Basophils Absolute: 0 10*3/uL (ref 0.0–0.1)
Basophils Relative: 0 % (ref 0–1)
Eosinophils Absolute: 0.3 10*3/uL (ref 0.0–0.7)
Eosinophils Relative: 3 % (ref 0–5)
Hemoglobin: 13.3 g/dL (ref 12.0–15.0)
MCH: 28.1 pg (ref 26.0–34.0)
MCHC: 32.7 g/dL (ref 30.0–36.0)
Neutro Abs: 5.7 10*3/uL (ref 1.7–7.7)
Neutrophils Relative %: 63 % (ref 43–77)
Platelets: 346 10*3/uL (ref 150–400)
RBC: 4.74 MIL/uL (ref 3.87–5.11)
RDW: 14.1 % (ref 11.5–15.5)

## 2013-04-04 LAB — WET PREP, GENITAL: Yeast Wet Prep HPF POC: NONE SEEN

## 2013-04-04 LAB — COMPREHENSIVE METABOLIC PANEL
ALT: 13 U/L (ref 0–35)
AST: 14 U/L (ref 0–37)
Albumin: 3.6 g/dL (ref 3.5–5.2)
Alkaline Phosphatase: 139 U/L — ABNORMAL HIGH (ref 39–117)
Chloride: 101 mEq/L (ref 96–112)
GFR calc Af Amer: >90 mL/min (ref >90)
Glucose, Bld: 95 mg/dL (ref 70–99)
Potassium: 2.9 mEq/L — ABNORMAL LOW (ref 3.5–5.1)
Sodium: 138 mEq/L (ref 135–145)
Total Bilirubin: 0.4 mg/dL (ref 0.3–1.2)
Total Protein: 7.2 g/dL (ref 6.0–8.3)

## 2013-04-04 MED ORDER — AZITHROMYCIN 250 MG PO TABS: 250.0000 mg | ORAL_TABLET | Freq: Every day | ORAL | Status: DC

## 2013-04-04 MED ORDER — GUAIFENESIN 100 MG/5ML PO SYRP: 100.0000 mg | ORAL_SOLUTION | ORAL | Status: DC | PRN

## 2013-04-04 MED ORDER — POTASSIUM CHLORIDE CRYS ER 20 MEQ PO TBCR
40.0000 meq | EXTENDED_RELEASE_TABLET | Freq: Once | ORAL | Status: AC
  Administered 2013-04-04: 40 meq via ORAL
  Filled 2013-04-04: qty 2

## 2013-04-04 MED ORDER — GUAIFENESIN-CODEINE 100-10 MG/5ML PO SOLN: 5.0000 mL | Freq: Three times a day (TID) | ORAL | Status: DC | PRN

## 2013-04-04 MED ORDER — ONDANSETRON 4 MG PO TBDP
8.0000 mg | ORAL_TABLET | Freq: Once | ORAL | Status: AC
  Administered 2013-04-04: 8 mg via ORAL
  Filled 2013-04-04: qty 2

## 2013-04-04 MED ORDER — ONDANSETRON 4 MG PO TBDP: 4.0000 mg | ORAL_TABLET | Freq: Three times a day (TID) | ORAL | Status: DC | PRN

## 2013-04-04 NOTE — ED Provider Notes (Signed)
CSN: [id number]     Arrival date & time 04/04/13  1210 History   First MD Initiated Contact with Patient 04/04/13 1343     Chief Complaint  Patient presents with  . Cough  . Fever   (Consider location/radiation/quality/duration/timing/severity/associated sxs/prior Treatment) HPI Comments: Patient is a 42 yo F PMH significant for HTN, Anxiety, Panic disorder, GERD, Asthma, Anemia, chronic abdominal pain w/ nausea and diarrhea, chronic pain disorder presented to the emergency department for 2 complaints. Patient's first complaint is productive cough with yellow sputum that began yesterday. Patient states she has associated subjective fevers and chills, but never took her temperature yesterday. Patient states she took "four motrin" and alleviated some of her symptoms. She states her cough is worse at night and laying down. Patient's second complaint is vaginal odor. Patient states she menstrual period on Saturday she noticed a change. She states she has had bacterial vaginosis in the past and this feels very similar. Patient is endorsing chronic nausea and abdominal pain symptoms similar to this. Nothing new or changed from the symptoms.  Patient is a 42 y.o. female presenting with cough and fever.  Cough Associated symptoms: fever (subjective)   Associated symptoms: no chest pain   Fever Associated symptoms: cough and nausea   Associated symptoms: no chest pain and no dysuria     Past Medical History  Diagnosis Date  . Hypertension   . Anxiety   . Panic attack   . TMJ disease     can open mouth wide but jaw pops  . Acid reflux   . Asthma     no inhaler use  . Heart murmur     developed during pregnancy- "leaky valve"  . Anemia   . SVD (spontaneous vaginal delivery)     x 3  . H/O endoscopy     checking for reflux  . Pleurisy   . Chronic abdominal pain   . Chronic nausea   . Chronic diarrhea   . Chronic back pain   . Chronic pain syndrome    Past Surgical History  Procedure  Laterality Date  . Eye surgery    . Dilitation and curettage    . Ankle surgery    . Laparoscopic appendectomy  10/05/2011    Procedure: APPENDECTOMY LAPAROSCOPIC;  Surgeon: Valarie Merino, MD;  Location: [facility] OR;  Service: General;  Laterality: N/A;  . Cholecystectomy  10/12/2011    Procedure: LAPAROSCOPIC CHOLECYSTECTOMY WITH INTRAOPERATIVE CHOLANGIOGRAM;  Surgeon: Wilmon Arms. Corliss Skains, MD;  Location: [facility] OR;  Service: General;  Laterality: N/A;  . Appendectomy    . Dilation and curettage of uterus    . Wisdom tooth extraction    . Laser ablation     Family History  Problem Relation Age of Onset  . Hypertension Other    History  Substance Use Topics  . Smoking status: Current Every Day Smoker -- 1.00 packs/day for 9 years    Types: Cigarettes  . Smokeless tobacco: Never Used  . Alcohol Use: No   OB History   Grav Para Term Preterm Abortions TAB SAB Ect Mult Living   5 3 3  2  2   3      Review of Systems  Constitutional: Positive for fever (subjective).  Respiratory: Positive for cough.   Cardiovascular: Negative for chest pain.  Gastrointestinal: Positive for nausea.  Genitourinary: Positive for vaginal bleeding (menstrual cycle). Negative for dysuria, frequency, vaginal discharge, vaginal pain and pelvic pain.  All other systems reviewed and  are negative.    Allergies  Morphine and related; Flagyl; Gabapentin; Paroxetine; and Vicodin  Home Medications   Current Outpatient Rx  Name  Route  Sig  Dispense  Refill  . albuterol (PROVENTIL HFA;VENTOLIN HFA) 108 (90 BASE) MCG/ACT inhaler   Inhalation   Inhale 2 puffs into the lungs every 6 (six) hours as needed for wheezing.   1 Inhaler   3   . ALPRAZolam (XANAX) 1 MG tablet   Oral   Take 1 mg by mouth 2 (two) times daily as needed for anxiety. For anxiety         . baclofen (LIORESAL) 10 MG tablet   Oral   Take 10 mg by mouth 3 (three) times daily.         [name] esomeprazole (NEXIUM) 40 MG capsule   Oral   Take 40  mg by mouth daily at 12 noon.         [name] HYDROcodone-acetaminophen (NORCO/VICODIN) 5-325 MG per tablet   Oral   Take 1 tablet by mouth every 6 (six) hours as needed for pain.         [name] ibuprofen (ADVIL,MOTRIN) 200 MG tablet   Oral   Take 800 mg by mouth every 6 (six) hours as needed for fever.         [name] lisinopril-hydrochlorothiazide (PRINZIDE,ZESTORETIC) 20-25 MG per tablet   Oral   Take 1 tablet by mouth daily.         . Lurasidone HCl (LATUDA) 20 MG TABS   Oral   Take 1 tablet by mouth at bedtime.         . topiramate (TOPAMAX) 25 MG tablet   Oral   Take 25 mg by mouth 3 (three) times daily.          BP 123/64  Pulse 61  Temp(Src) 98.8 F (37.1 C) (Oral)  Resp 18  SpO2 96%  LMP 04/01/2013 Physical Exam  Constitutional: She is oriented to person, place, and time. She appears well-developed and well-nourished. No distress.  HENT:  Head: Normocephalic and atraumatic.  Right Ear: External ear normal.  Left Ear: External ear normal.  Nose: Nose normal.  Mouth/Throat: Oropharynx is clear and moist. No oropharyngeal exudate.  Eyes: Conjunctivae are normal.  Neck: Normal range of motion. Neck supple.  Cardiovascular: Normal rate, regular rhythm and normal heart sounds.   Pulmonary/Chest: Effort normal and breath sounds normal. No respiratory distress. She exhibits no tenderness.  Patient w/ cough w/ sputum production  Abdominal: Soft. Bowel sounds are normal. She exhibits no distension. There is no tenderness. There is no rebound and no guarding.  Musculoskeletal: Normal range of motion.  Lymphadenopathy:    She has no cervical adenopathy.  Neurological: She is alert and oriented to person, place, and time.  Skin: Skin is warm and dry. She is not diaphoretic.   Exam performed by Francee Piccolo L,  exam chaperoned Date: 04/04/2013 Pelvic exam: normal external genitalia without evidence of trauma. VULVA: normal appearing vulva with no masses, tenderness  or lesion. VAGINA: normal appearing vagina with normal color and discharge, no lesions. CERVIX: normal appearing cervix without lesions, cervical motion tenderness absent, cervical os closed with out purulent discharge; vaginal discharge - clear, Wet prep and DNA probe for chlamydia and GC obtained.   ADNEXA: normal adnexa in size, nontender and no masses UTERUS: uterus is normal size, shape, consistency and nontender.   ED Course  Procedures (including critical care time) Labs Review Labs Reviewed  COMPREHENSIVE METABOLIC PANEL - Abnormal; Notable for the following:    Potassium 2.9 (*)    Alkaline Phosphatase 139 (*)    All other components within normal limits  WET PREP, GENITAL  GC/CHLAMYDIA PROBE AMP  CBC WITH DIFFERENTIAL  URINALYSIS, ROUTINE W REFLEX MICROSCOPIC  PREGNANCY, URINE   Imaging Review Dg Chest 2 View  04/04/2013   CLINICAL DATA:  Cough.  Shortness of breath.  EXAM: CHEST  2 VIEW  COMPARISON:  11/29/2012.  FINDINGS: Mediastinum and hilar structures normal. Mild increased interstitial markings noted bilaterally. Mild pneumonitis cannot be excluded. No pleural effusion pneumothorax. Heart size normal. Degenerative changes thoracic spine.  IMPRESSION: Minimal increase in interstitial markings bilaterally. Mild pneumonitis cannot be excluded.   Electronically Signed   By: Maisie Fus  Register   On: 04/04/2013 13:17    EKG Interpretation   None       MDM   1. CAP (community acquired pneumonia)     Afebrile, NAD, non-toxic appearing, AAOx4.   1) Pneumonitis: Patient has been diagnosed with CAP via chest xray. Pt is not ill appearing, immunocompromised, and does not have multiple co morbidities, therefore I feel like the they can be treated as an OP with abx therapy. Pt has been advised to return to the ED if symptoms worsen or they do not improve. Pt verbalizes understanding and is agreeable with plan.   2) Vaginal odor: Wet prep unremarkable. Few WBCs noted, but will  not purse treatment at this time. Advised ob/gyn follow up.   Return precautions discussed. Patient is agreeable to plan. Patient d/w with Dr. Deretha Emory, agrees with plan.       Jeannetta Ellis, PA-C 04/05/13 1546

## 2013-04-04 NOTE — ED Notes (Addendum)
Multiple complaints.Pt reports having cough x 2 weeks, productive cough with yellow sputum since yesterday and also having fever and n/v. No acute distress noted at triage, airway intact. Also reports vaginal discharge and odor.

## 2013-04-05 LAB — GC/CHLAMYDIA PROBE AMP
CT Probe RNA: NEGATIVE
GC Probe RNA: NEGATIVE

## 2013-04-08 NOTE — ED Provider Notes (Signed)
Medical screening examination/treatment/procedure(s) were performed by non-physician practitioner and as supervising physician I was immediately available for consultation/collaboration.  EKG Interpretation   None         Shelda Jakes, MD 04/08/13 [phone]

## 2013-06-05 ENCOUNTER — Emergency Department (HOSPITAL_COMMUNITY): Payer: Medicaid Other

## 2013-06-05 ENCOUNTER — Emergency Department (HOSPITAL_COMMUNITY)
Admission: EM | Admit: 2013-06-05 | Discharge: 2013-06-05 | Disposition: A | Discharge status: Home / Self Care | Payer: Medicaid Other | Attending: Emergency Medicine | Admitting: Emergency Medicine

## 2013-06-05 ENCOUNTER — Encounter (HOSPITAL_COMMUNITY): Payer: Self-pay | Admitting: Emergency Medicine

## 2013-06-05 DIAGNOSIS — F411 Generalized anxiety disorder: Secondary | ICD-10-CM | POA: Insufficient documentation

## 2013-06-05 DIAGNOSIS — Z888 Allergy status to other drugs, medicaments and biological substances status: Secondary | ICD-10-CM | POA: Insufficient documentation

## 2013-06-05 DIAGNOSIS — D649 Anemia, unspecified: Secondary | ICD-10-CM | POA: Insufficient documentation

## 2013-06-05 DIAGNOSIS — R197 Diarrhea, unspecified: Secondary | ICD-10-CM | POA: Insufficient documentation

## 2013-06-05 DIAGNOSIS — Z885 Allergy status to narcotic agent status: Secondary | ICD-10-CM | POA: Insufficient documentation

## 2013-06-05 DIAGNOSIS — M549 Dorsalgia, unspecified: Secondary | ICD-10-CM | POA: Insufficient documentation

## 2013-06-05 DIAGNOSIS — J45909 Unspecified asthma, uncomplicated: Secondary | ICD-10-CM | POA: Insufficient documentation

## 2013-06-05 DIAGNOSIS — I10 Essential (primary) hypertension: Secondary | ICD-10-CM | POA: Insufficient documentation

## 2013-06-05 DIAGNOSIS — Z79899 Other long term (current) drug therapy: Secondary | ICD-10-CM | POA: Insufficient documentation

## 2013-06-05 DIAGNOSIS — G8929 Other chronic pain: Secondary | ICD-10-CM | POA: Insufficient documentation

## 2013-06-05 DIAGNOSIS — R109 Unspecified abdominal pain: Secondary | ICD-10-CM

## 2013-06-05 DIAGNOSIS — Z8679 Personal history of other diseases of the circulatory system: Secondary | ICD-10-CM | POA: Insufficient documentation

## 2013-06-05 DIAGNOSIS — Z3202 Encounter for pregnancy test, result negative: Secondary | ICD-10-CM | POA: Insufficient documentation

## 2013-06-05 DIAGNOSIS — Z881 Allergy status to other antibiotic agents status: Secondary | ICD-10-CM | POA: Insufficient documentation

## 2013-06-05 DIAGNOSIS — R630 Anorexia: Secondary | ICD-10-CM | POA: Insufficient documentation

## 2013-06-05 DIAGNOSIS — Z8709 Personal history of other diseases of the respiratory system: Secondary | ICD-10-CM | POA: Insufficient documentation

## 2013-06-05 DIAGNOSIS — Z8742 Personal history of other diseases of the female genital tract: Secondary | ICD-10-CM | POA: Insufficient documentation

## 2013-06-05 DIAGNOSIS — Z9889 Other specified postprocedural states: Secondary | ICD-10-CM | POA: Insufficient documentation

## 2013-06-05 DIAGNOSIS — F17200 Nicotine dependence, unspecified, uncomplicated: Secondary | ICD-10-CM | POA: Insufficient documentation

## 2013-06-05 DIAGNOSIS — R110 Nausea: Secondary | ICD-10-CM | POA: Insufficient documentation

## 2013-06-05 DIAGNOSIS — F410 Panic disorder [episodic paroxysmal anxiety] without agoraphobia: Secondary | ICD-10-CM | POA: Insufficient documentation

## 2013-06-05 DIAGNOSIS — K219 Gastro-esophageal reflux disease without esophagitis: Secondary | ICD-10-CM | POA: Insufficient documentation

## 2013-06-05 LAB — CBC WITH DIFFERENTIAL/PLATELET
BASOS ABS: 0 10*3/uL (ref 0.0–0.1)
Basophils Relative: 0 % (ref 0–1)
EOS ABS: 0.1 10*3/uL (ref 0.0–0.7)
EOS PCT: 2 % (ref 0–5)
HCT: 41.3 % (ref 36.0–46.0)
Hemoglobin: 13.6 g/dL (ref 12.0–15.0)
Lymphocytes Relative: 28 % (ref 12–46)
Lymphs Abs: 2.4 10*3/uL (ref 0.7–4.0)
MCH: 28.3 pg (ref 26.0–34.0)
MCHC: 32.9 g/dL (ref 30.0–36.0)
MCV: 86 fL (ref 78.0–100.0)
Monocytes Absolute: 0.5 10*3/uL (ref 0.1–1.0)
Monocytes Relative: 5 % (ref 3–12)
NEUTROS PCT: 65 % (ref 43–77)
Neutro Abs: 5.7 10*3/uL (ref 1.7–7.7)
PLATELETS: 367 10*3/uL (ref 150–400)
RBC: 4.8 MIL/uL (ref 3.87–5.11)
RDW: 14.7 % (ref 11.5–15.5)
WBC: 8.7 10*3/uL (ref 4.0–10.5)

## 2013-06-05 LAB — URINALYSIS, ROUTINE W REFLEX MICROSCOPIC
BILIRUBIN URINE: NEGATIVE
Glucose, UA: NEGATIVE mg/dL
Hgb urine dipstick: NEGATIVE
KETONES UR: NEGATIVE mg/dL
LEUKOCYTES UA: NEGATIVE
NITRITE: NEGATIVE
PROTEIN: NEGATIVE mg/dL
Specific Gravity, Urine: 1.017 (ref 1.005–1.030)
UROBILINOGEN UA: 1 mg/dL (ref 0.0–1.0)
pH: 6 (ref 5.0–8.0)

## 2013-06-05 LAB — COMPREHENSIVE METABOLIC PANEL
ALK PHOS: 123 U/L — AB (ref 39–117)
ALT: 22 U/L (ref 0–35)
AST: 23 U/L (ref 0–37)
Albumin: 3.6 g/dL (ref 3.5–5.2)
BILIRUBIN TOTAL: 0.4 mg/dL (ref 0.3–1.2)
BUN: 6 mg/dL (ref 6–23)
CO2: 27 meq/L (ref 19–32)
CREATININE: 0.7 mg/dL (ref 0.50–1.10)
Calcium: 9.1 mg/dL (ref 8.4–10.5)
Chloride: 104 mEq/L (ref 96–112)
GFR calc Af Amer: >90 mL/min (ref >90)
GFR calc non Af Amer: >90 mL/min (ref >90)
Glucose, Bld: 118 mg/dL — ABNORMAL HIGH (ref 70–99)
Potassium: 3.1 mEq/L — ABNORMAL LOW (ref 3.7–5.3)
Sodium: 144 mEq/L (ref 137–147)
Total Protein: 7 g/dL (ref 6.0–8.3)

## 2013-06-05 LAB — POCT PREGNANCY, URINE: PREG TEST UR: NEGATIVE

## 2013-06-05 LAB — LIPASE, BLOOD: Lipase: 18 U/L (ref 11–59)

## 2013-06-05 MED ORDER — SODIUM CHLORIDE 0.9 % IV BOLUS (SEPSIS)
1000.0000 mL | Freq: Once | INTRAVENOUS | Status: AC
  Administered 2013-06-05: 1000 mL via INTRAVENOUS

## 2013-06-05 MED ORDER — HYDROMORPHONE HCL PF 1 MG/ML IJ SOLN
1.0000 mg | Freq: Once | INTRAMUSCULAR | Status: AC
  Administered 2013-06-05: 1 mg via INTRAVENOUS
  Filled 2013-06-05: qty 1

## 2013-06-05 MED ORDER — ONDANSETRON HCL 4 MG/2ML IJ SOLN
4.0000 mg | Freq: Once | INTRAMUSCULAR | Status: AC
  Administered 2013-06-05: 4 mg via INTRAVENOUS
  Filled 2013-06-05: qty 2

## 2013-06-05 MED ORDER — ALPRAZOLAM 0.5 MG PO TABS
1.0000 mg | ORAL_TABLET | Freq: Once | ORAL | Status: AC
  Administered 2013-06-05: 1 mg via ORAL
  Filled 2013-06-05: qty 2

## 2013-06-05 MED ORDER — ONDANSETRON HCL 4 MG PO TABS: 4.0000 mg | ORAL_TABLET | Freq: Four times a day (QID) | ORAL | Status: DC

## 2013-06-05 MED ORDER — OXYCODONE HCL 10 MG PO TABS: 10.0000 mg | ORAL_TABLET | Freq: Four times a day (QID) | ORAL | Status: DC | PRN

## 2013-06-05 NOTE — ED Notes (Signed)
Patient transported to X-ray 

## 2013-06-05 NOTE — ED Notes (Signed)
Pt c/o lower abdominal cramping.  Pt describes the pain as a contraction that radiates to back.  She has been menstruating since Tues, and the bleeding is no heavier than normal.

## 2013-06-05 NOTE — ED Provider Notes (Addendum)
CSN: [id number]     Arrival date & time 06/05/13  1245 History   First MD Initiated Contact with Patient 06/05/13 1355     Chief Complaint  Patient presents with  . Vaginal Bleeding  . abdominal cramping    (Consider location/radiation/quality/duration/timing/severity/associated sxs/prior Treatment) Patient is a 43 y.o. female presenting with abdominal pain. The history is provided by the patient.  Abdominal Pain Pain location:  Periumbilical Pain quality: cramping, sharp, squeezing and stabbing   Pain radiates to:  Back Pain severity:  Severe Onset quality:  Gradual Duration:  3 days Timing:  Intermittent Progression:  Waxing and waning Chronicity:  New Relieved by:  Nothing Worsened by:  Eating Ineffective treatments: rest, percocet 7.5. Associated symptoms: anorexia and nausea   Associated symptoms: no chest pain, no cough, no fever, no shortness of breath, no vaginal discharge and no vomiting   Associated symptoms comment:  Loose stool.  No frank diarrhea.  Also normal menses with normal bleeding starting several days ago Risk factors: multiple surgeries     Past Medical History  Diagnosis Date  . Hypertension   . Anxiety   . Panic attack   . TMJ disease     can open mouth wide but jaw pops  . Acid reflux   . Asthma     no inhaler use  . Heart murmur     developed during pregnancy- "leaky valve"  . Anemia   . SVD (spontaneous vaginal delivery)     x 3  . H/O endoscopy     checking for reflux  . Pleurisy   . Chronic abdominal pain   . Chronic nausea   . Chronic diarrhea   . Chronic back pain   . Chronic pain syndrome    Past Surgical History  Procedure Laterality Date  . Eye surgery    . Dilitation and curettage    . Ankle surgery    . Laparoscopic appendectomy  10/05/2011    Procedure: APPENDECTOMY LAPAROSCOPIC;  Surgeon: Valarie Merino, MD;  Location: [facility] OR;  Service: General;  Laterality: N/A;  . Cholecystectomy  10/12/2011    Procedure: LAPAROSCOPIC  CHOLECYSTECTOMY WITH INTRAOPERATIVE CHOLANGIOGRAM;  Surgeon: Wilmon Arms. Corliss Skains, MD;  Location: [facility] OR;  Service: General;  Laterality: N/A;  . Appendectomy    . Dilation and curettage of uterus    . Wisdom tooth extraction    . Laser ablation     Family History  Problem Relation Age of Onset  . Hypertension Other    History  Substance Use Topics  . Smoking status: Current Every Day Smoker -- 1.00 packs/day for 9 years    Types: Cigarettes  . Smokeless tobacco: Never Used  . Alcohol Use: No   OB History   Grav Para Term Preterm Abortions TAB SAB Ect Mult Living   5 3 3  2  2   3      Review of Systems  Constitutional: Negative for fever.  Respiratory: Negative for cough and shortness of breath.   Cardiovascular: Negative for chest pain.  Gastrointestinal: Positive for nausea, abdominal pain and anorexia. Negative for vomiting.  Genitourinary: Negative for vaginal discharge.  All other systems reviewed and are negative.    Allergies  Morphine and related; Fentanyl; Flagyl; Gabapentin; Paroxetine; and Vicodin  Home Medications   Current Outpatient Rx  Name  Route  Sig  Dispense  Refill  . albuterol (PROVENTIL HFA;VENTOLIN HFA) 108 (90 BASE) MCG/ACT inhaler   Inhalation   Inhale 2 puffs  into the lungs every 6 (six) hours as needed for wheezing.   1 Inhaler   3   . ALPRAZolam (XANAX) 1 MG tablet   Oral   Take 1 mg by mouth 2 (two) times daily as needed for anxiety. For anxiety         . azithromycin (ZITHROMAX Z-PAK) 250 MG tablet   Oral   Take 1 tablet (250 mg total) by mouth daily. 500mg  PO day 1, then 250mg  PO days 205   6 tablet   0   . baclofen (LIORESAL) 10 MG tablet   Oral   Take 10 mg by mouth 3 (three) times daily.         [name] esomeprazole (NEXIUM) 40 MG capsule   Oral   Take 40 mg by mouth daily at 12 noon.         [name] guaifenesin (ROBITUSSIN) 100 MG/5ML syrup   Oral   Take 5-10 mLs (100-200 mg total) by mouth every 4 (four) hours as needed for  cough.   60 mL   0   . guaiFENesin-codeine 100-10 MG/5ML syrup   Oral   Take 5 mLs by mouth 3 (three) times daily as needed for cough.   120 mL   0   . HYDROcodone-acetaminophen (NORCO/VICODIN) 5-325 MG per tablet   Oral   Take 1 tablet by mouth every 6 (six) hours as needed for pain.         [name] ibuprofen (ADVIL,MOTRIN) 200 MG tablet   Oral   Take 800 mg by mouth every 6 (six) hours as needed for fever.         [name] lisinopril-hydrochlorothiazide (PRINZIDE,ZESTORETIC) 20-25 MG per tablet   Oral   Take 1 tablet by mouth daily.         . Lurasidone HCl (LATUDA) 20 MG TABS   Oral   Take 1 tablet by mouth at bedtime.         . ondansetron (ZOFRAN ODT) 4 MG disintegrating tablet   Oral   Take 1 tablet (4 mg total) by mouth every 8 (eight) hours as needed for nausea or vomiting.   10 tablet   0   . topiramate (TOPAMAX) 25 MG tablet   Oral   Take 25 mg by mouth 3 (three) times daily.          BP 129/82  Pulse 93  Temp(Src) 98 F (36.7 C) (Oral)  Resp 16  Ht 5\' 7"  (1.702 m)  Wt 259 lb (117.482 kg)  BMI 40.56 kg/m2  SpO2 100%  LMP 06/02/2013 Physical Exam  Nursing note and vitals reviewed. Constitutional: She is oriented to person, place, and time. She appears well-developed and well-nourished. No distress.  HENT:  Head: Normocephalic and atraumatic.  Mouth/Throat: Oropharynx is clear and moist.  Eyes: Conjunctivae and EOM are normal. Pupils are equal, round, and reactive to light.  Neck: Normal range of motion. Neck supple.  Cardiovascular: Normal rate, regular rhythm and intact distal pulses.   No murmur heard. Pulmonary/Chest: Effort normal and breath sounds normal. No respiratory distress. She has no wheezes. She has no rales.  Abdominal: Soft. She exhibits no distension. There is tenderness in the periumbilical area. There is no rebound and no guarding.    Musculoskeletal: Normal range of motion. She exhibits no edema and no tenderness.  Neurological:  She is alert and oriented to person, place, and time.  Skin: Skin is warm and dry. No rash noted. No erythema.  Psychiatric: She  has a normal mood and affect. Her behavior is normal.    ED Course  Procedures (including critical care time) Labs Review Labs Reviewed  COMPREHENSIVE METABOLIC PANEL - Abnormal; Notable for the following:    Potassium 3.1 (*)    Glucose, Bld 118 (*)    Alkaline Phosphatase 123 (*)    All other components within normal limits  LIPASE, BLOOD  CBC WITH DIFFERENTIAL  URINALYSIS, ROUTINE W REFLEX MICROSCOPIC  URINALYSIS, ROUTINE W REFLEX MICROSCOPIC  POCT PREGNANCY, URINE   Imaging Review Dg Abd Acute W/chest  06/05/2013   CLINICAL DATA:  Pain.  EXAM: ACUTE ABDOMEN SERIES (ABDOMEN 2 VIEW & CHEST 1 VIEW)  COMPARISON:  CT of 11/29/2012.  FINDINGS: Soft tissue structures are unremarkable. Surgical clips in right upper quadrant consistent with prior cholecystectomy. Fluid levels are noted in nondistended loops of bowel most, most likely colon. Diarrheal illness could present in this fashion. No free air is identified. Calcifications in pelvis consistent with phleboliths 2  IMPRESSION: Nondistended loops of bowel containing fluid. Diarrhea ileus could present in this fashion. Followup abdominal series can be obtained for further evaluation as needed.   Electronically Signed   By: Maisie Fushomas  Register   On: 06/05/2013 16:36    EKG Interpretation   None       MDM   1. Abdominal pain      patient presenting with complaints of. Umbilical abdominal pain that started 3 days ago with nausea and loose stool. She is currently on her menstrual period but states she's having no lower abdominal menstrual cramping no excessive bleeding and it is no different than her usual period. She is taking Percocet at home without improvement in the pain. Patient does have a history of chronic nausea, diarrhea and abdominal pain. However given her history of multiple surgeries including  cholecystectomy and appendectomy will ensure that she is not developing ileus or obstruction today. Decreased bowel sounds on exam. Low suspicion for any pelvic cause of her pain today. Also appear slightly dehydrated we'll give IV fluids. Potassium is 3.1 today but otherwise labs are insignificant. Normal CBC, CMP, lipase and urine. Acute abdominal series pending  4:31 PM No signs of obstruction on AAS but possibly diarrhea ileus.  Pt has not been on any abx recently and doubt c.diff.  She is tolerating po's and will go home with clear liquid diet to advance as need.  Feel pain is from her typical chronic abd pain.  Pt is tolerating po's.  Will d/c home with pain and nausea meds.  Gwyneth SproutWhitney Damira Kem, MD 06/05/13 1636  Gwyneth SproutWhitney Adriano Bischof, MD 06/05/13 [phone]

## 2013-06-05 NOTE — Progress Notes (Signed)
ED CM [facility] Roxicodone prescription written by Dr. Lacretia NicksW. Anitra LauthPlunkett.

## 2013-06-05 NOTE — ED Notes (Signed)
MD at bedside. 

## 2013-06-05 NOTE — Discharge Instructions (Signed)
Abdominal Pain, Women °Abdominal (stomach, pelvic, or belly) pain can be caused by many things. It is important to tell your doctor: °· The location of the pain. °· Does it come and go or is it present all the time? °· Are there things that start the pain (eating certain foods, exercise)? °· Are there other symptoms associated with the pain (fever, nausea, vomiting, diarrhea)? °All of this is helpful to know when trying to find the cause of the pain. °CAUSES  °· Stomach: virus or bacteria infection, or ulcer. °· Intestine: appendicitis (inflamed appendix), regional ileitis (Crohn's disease), ulcerative colitis (inflamed colon), irritable bowel syndrome, diverticulitis (inflamed diverticulum of the colon), or cancer of the stomach or intestine. °· Gallbladder disease or stones in the gallbladder. °· Kidney disease, kidney stones, or infection. °· Pancreas infection or cancer. °· Fibromyalgia (pain disorder). °· Diseases of the female organs: °· Uterus: fibroid (non-cancerous) tumors or infection. °· Fallopian tubes: infection or tubal pregnancy. °· Ovary: cysts or tumors. °· Pelvic adhesions (scar tissue). °· Endometriosis (uterus lining tissue growing in the pelvis and on the pelvic organs). °· Pelvic congestion syndrome (female organs filling up with blood just before the menstrual period). °· Pain with the menstrual period. °· Pain with ovulation (producing an egg). °· Pain with an IUD (intrauterine device, birth control) in the uterus. °· Cancer of the female organs. °· Functional pain (pain not caused by a disease, may improve without treatment). °· Psychological pain. °· Depression. °DIAGNOSIS  °Your doctor will decide the seriousness of your pain by doing an examination. °· Blood tests. °· X-rays. °· Ultrasound. °· CT scan (computed tomography, special type of X-ray). °· MRI (magnetic resonance imaging). °· Cultures, for infection. °· Barium enema (dye inserted in the large intestine, to better view it with  X-rays). °· Colonoscopy (looking in intestine with a lighted tube). °· Laparoscopy (minor surgery, looking in abdomen with a lighted tube). °· Major abdominal exploratory surgery (looking in abdomen with a large incision). °TREATMENT  °The treatment will depend on the cause of the pain.  °· Many cases can be observed and treated at home. °· Over-the-counter medicines recommended by your caregiver. °· Prescription medicine. °· Antibiotics, for infection. °· Birth control pills, for painful periods or for ovulation pain. °· Hormone treatment, for endometriosis. °· Nerve blocking injections. °· Physical therapy. °· Antidepressants. °· Counseling with a psychologist or psychiatrist. °· Minor or major surgery. °HOME CARE INSTRUCTIONS  °· Do not take laxatives, unless directed by your caregiver. °· Take over-the-counter pain medicine only if ordered by your caregiver. Do not take aspirin because it can cause an upset stomach or bleeding. °· Try a clear liquid diet (broth or water) as ordered by your caregiver. Slowly move to a bland diet, as tolerated, if the pain is related to the stomach or intestine. °· Have a thermometer and take your temperature several times a day, and record it. °· Bed rest and sleep, if it helps the pain. °· Avoid sexual intercourse, if it causes pain. °· Avoid stressful situations. °· Keep your follow-up appointments and tests, as your caregiver orders. °· If the pain does not go away with medicine or surgery, you may try: °· Acupuncture. °· Relaxation exercises (yoga, meditation). °· Group therapy. °· Counseling. °SEEK MEDICAL CARE IF:  °· You notice certain foods cause stomach pain. °· Your home care treatment is not helping your pain. °· You need stronger pain medicine. °· You want your IUD removed. °· You feel faint or   lightheaded. °· You develop nausea and vomiting. °· You develop a rash. °· You are having side effects or an allergy to your medicine. °SEEK IMMEDIATE MEDICAL CARE IF:  °· Your  pain does not go away or gets worse. °· You have a fever. °· Your pain is felt only in portions of the abdomen. The right side could possibly be appendicitis. The left lower portion of the abdomen could be colitis or diverticulitis. °· You are passing blood in your stools (bright red or black tarry stools, with or without vomiting). °· You have blood in your urine. °· You develop chills, with or without a fever. °· You pass out. °MAKE SURE YOU:  °· Understand these instructions. °· Will watch your condition. °· Will get help right away if you are not doing well or get worse. °Document Released: 03/01/2007 Document Revised: 07/27/2011 Document Reviewed: 03/21/2009 °[employer]® Patient Information ©2014 [employer]. ° °

## 2013-06-17 IMAGING — CT CT ABD-PELV W/ CM
2 of 5 series · 14 of 32 positions shown, 19 images · IV contrast (water/omni  & 100ml omni 300)
Comparison: None.

CLINICAL DATA: Abdominal pain, distension.

CT ABDOMEN AND PELVIS WITH CONTRAST
TECHNIQUE: Multidetector CT imaging of the abdomen and pelvis was
performed following the standard protocol during bolus
administration of intravenous contrast.
Contrast: 100mL OMNIPAQUE IOHEXOL 300 MG/ML  SOLN

[Series 2: routine abdomen · axial · 0.98mm/px · z∈[-424,-64]mm · 7 of 98 slices shown, 12 images]
[im 13/98  soft-tissue]
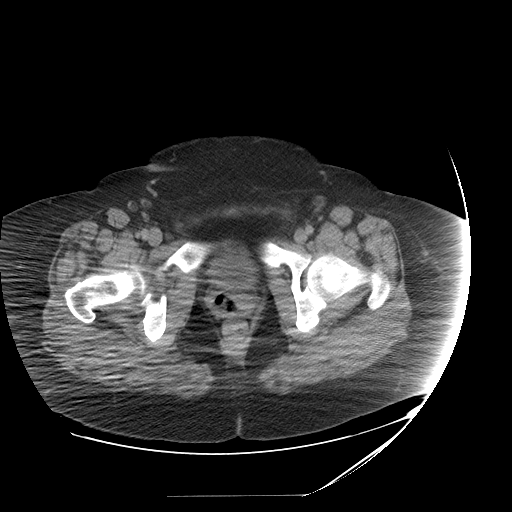
[im 13/98  bone]
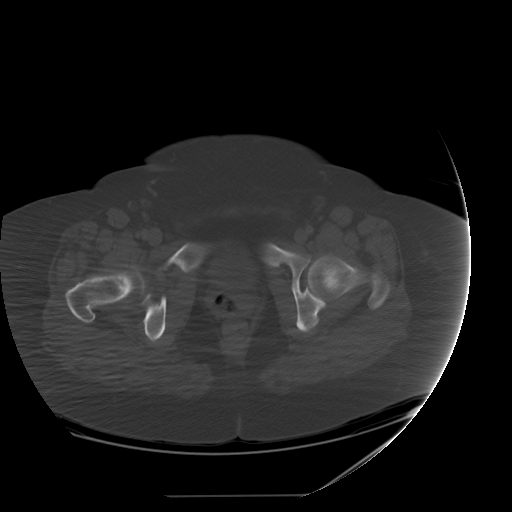
[im 25/98  soft-tissue]
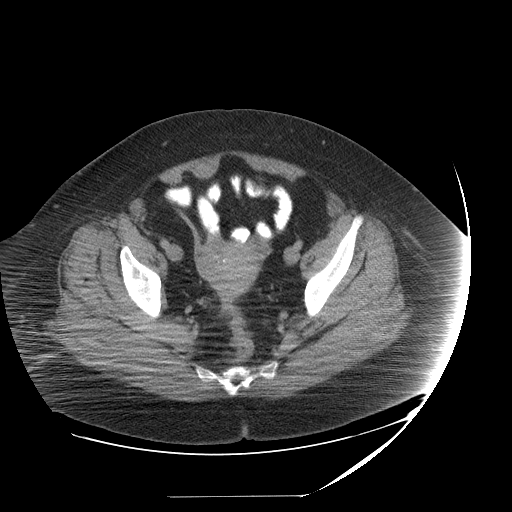
[im 37/98  soft-tissue]
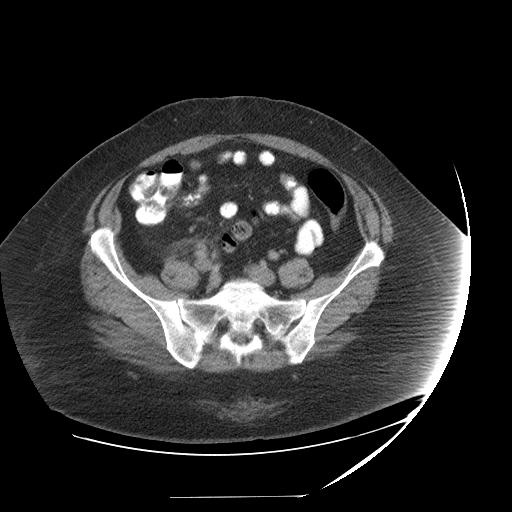
[im 49/98  soft-tissue]
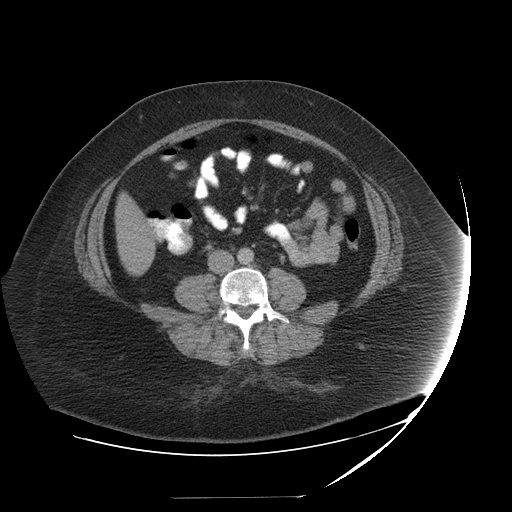
[im 49/98  lung]
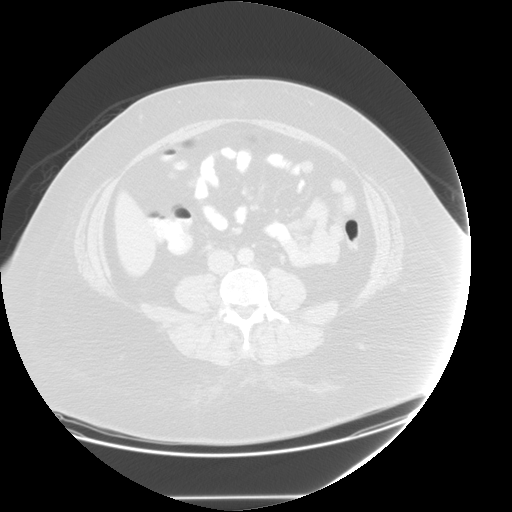
[im 61/98  soft-tissue]
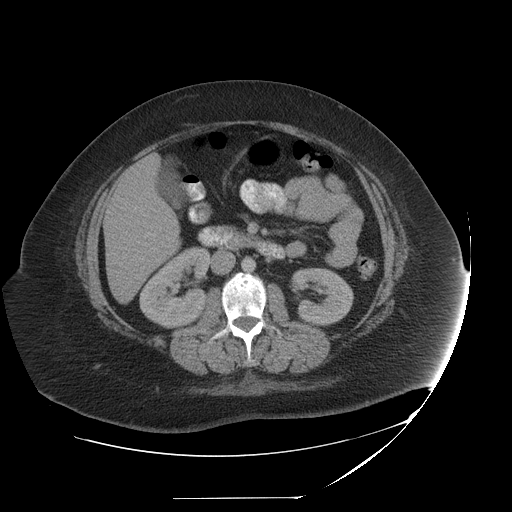
[im 61/98  lung]
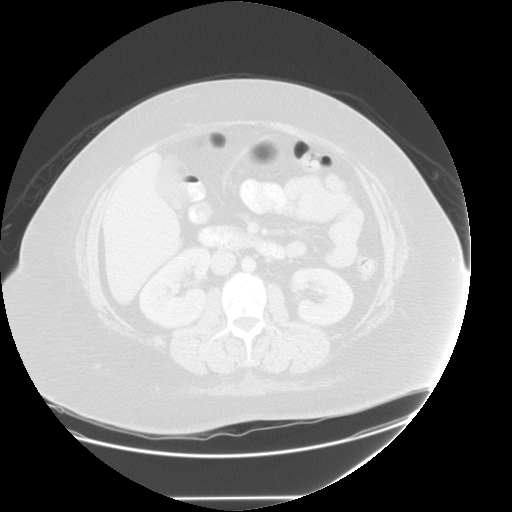
[im 73/98  soft-tissue]
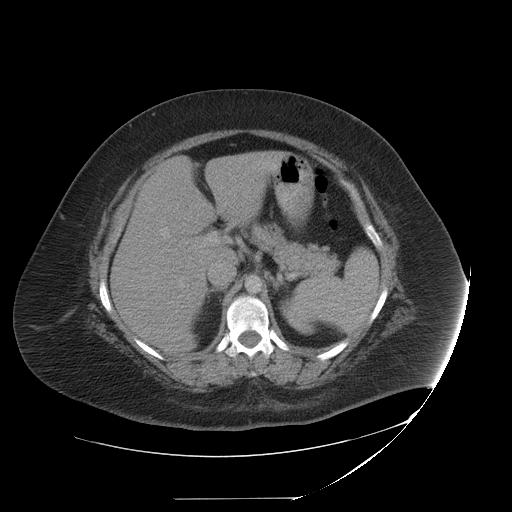
[im 73/98  lung]
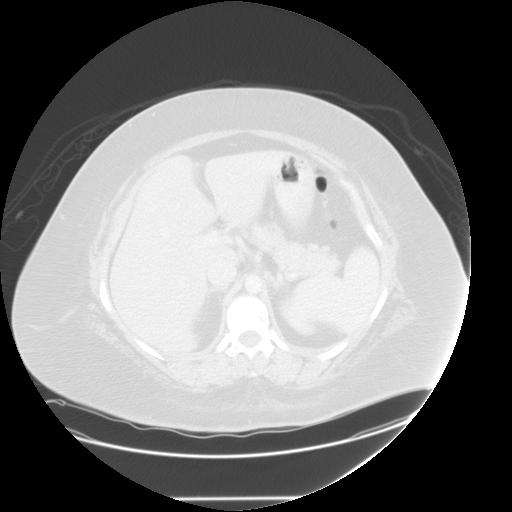
[im 85/98  soft-tissue]
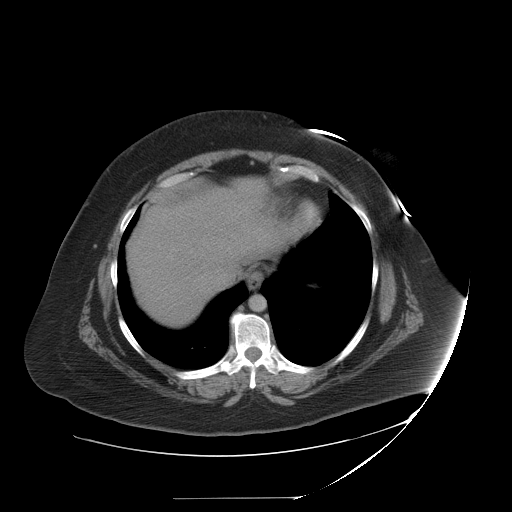
[im 85/98  lung]
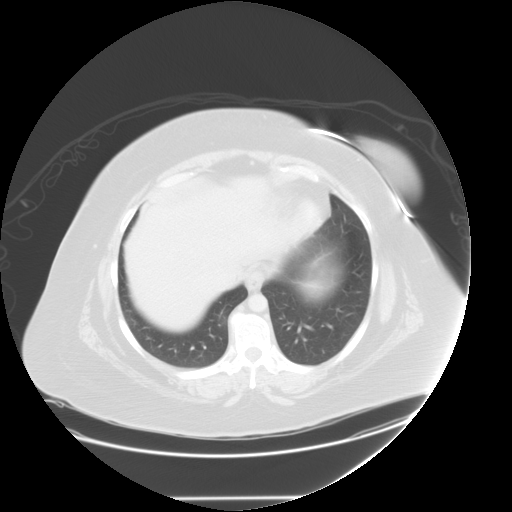

[Series 401: sag · sagittal · 0.98mm/px · 7 of 122 slices shown]
[im 14/122  soft-tissue]
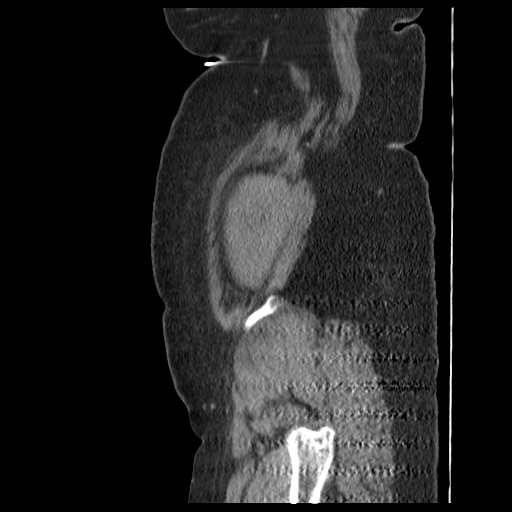
[im 27/122  soft-tissue]
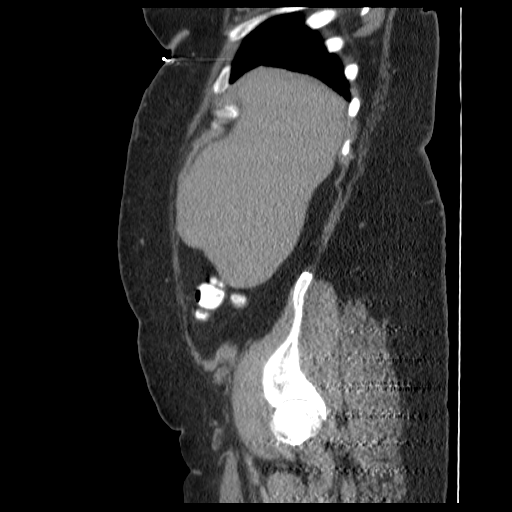
[im 41/122  soft-tissue]
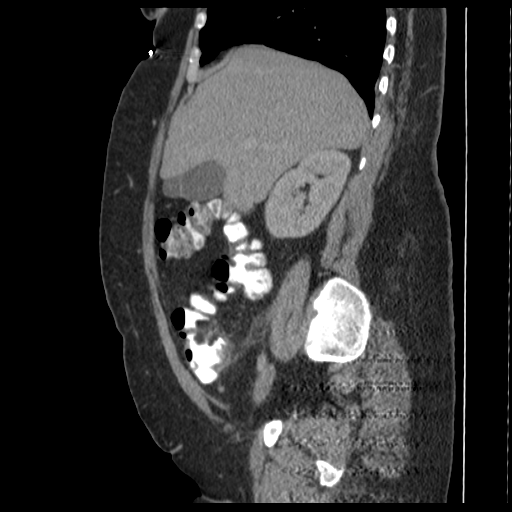
[im 54/122  soft-tissue]
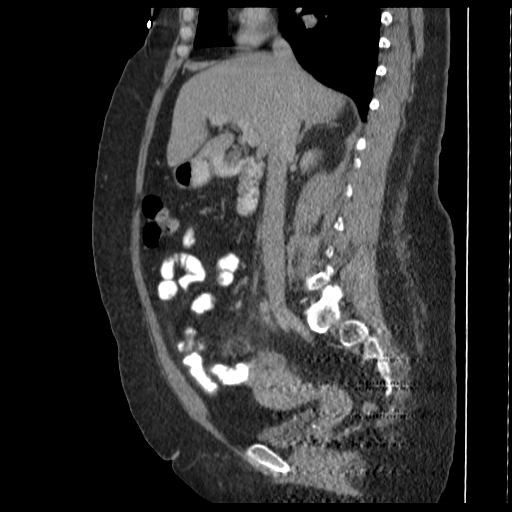
[im 68/122  soft-tissue]
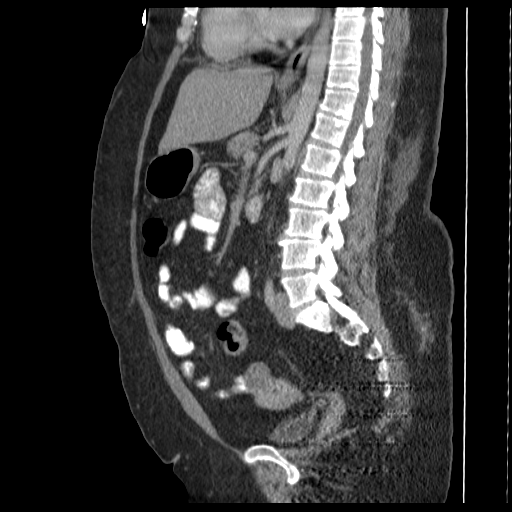
[im 81/122  soft-tissue]
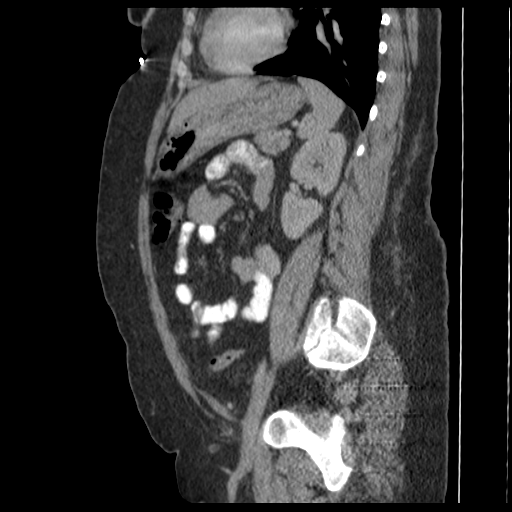
[im 95/122  soft-tissue]
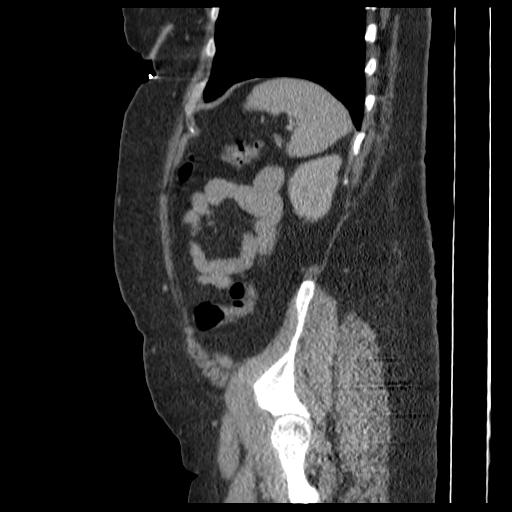

[14 of 32 positions shown; findings below may reference images not displayed]

FINDINGS: Limited images through the lung bases demonstrate no
significant appreciable abnormality. The heart size is within
normal limits. No pleural or pericardial effusion.

Hepatic steatosis.  No focal abnormality.  Unremarkable biliary
system, spleen, pancreas, adrenal glands.

Symmetric renal enhancement.  No hydronephrosis or hydroureter.

Acute appendicitis with the appendix measuring up to 9 mm in
diameter.  There is mild periappendiceal fat stranding.  No bowel
obstruction.  No CT evidence for colitis.  No free intraperitoneal
air or loculated fluid.

Decompressed bladder.  IUD within the uterus.  No adnexal mass.

Normal caliber vasculature.  No lymphadenopathy.

Multilevel degenerative changes of the imaged spine. Mild wedge
deformity of L2 and T11.  No definite acute or aggressive appearing
osseous lesion.
IMPRESSION: Acute appendicitis.

## 2014-01-27 ENCOUNTER — Emergency Department (HOSPITAL_COMMUNITY): Payer: Medicaid Other

## 2014-01-27 ENCOUNTER — Emergency Department (HOSPITAL_COMMUNITY)
Admission: EM | Admit: 2014-01-27 | Discharge: 2014-01-27 | Disposition: A | Discharge status: Home / Self Care | Payer: Medicaid Other | Attending: Emergency Medicine | Admitting: Emergency Medicine

## 2014-01-27 ENCOUNTER — Encounter (HOSPITAL_COMMUNITY): Payer: Self-pay | Admitting: Emergency Medicine

## 2014-01-27 DIAGNOSIS — S1093XA Contusion of unspecified part of neck, initial encounter: Secondary | ICD-10-CM | POA: Diagnosis not present

## 2014-01-27 DIAGNOSIS — S0003XA Contusion of scalp, initial encounter: Secondary | ICD-10-CM | POA: Insufficient documentation

## 2014-01-27 DIAGNOSIS — Z862 Personal history of diseases of the blood and blood-forming organs and certain disorders involving the immune mechanism: Secondary | ICD-10-CM | POA: Insufficient documentation

## 2014-01-27 DIAGNOSIS — S0990XA Unspecified injury of head, initial encounter: Secondary | ICD-10-CM | POA: Diagnosis not present

## 2014-01-27 DIAGNOSIS — H9319 Tinnitus, unspecified ear: Secondary | ICD-10-CM | POA: Diagnosis not present

## 2014-01-27 DIAGNOSIS — Z79899 Other long term (current) drug therapy: Secondary | ICD-10-CM | POA: Insufficient documentation

## 2014-01-27 DIAGNOSIS — S0993XA Unspecified injury of face, initial encounter: Secondary | ICD-10-CM | POA: Diagnosis not present

## 2014-01-27 DIAGNOSIS — IMO0002 Reserved for concepts with insufficient information to code with codable children: Secondary | ICD-10-CM | POA: Insufficient documentation

## 2014-01-27 DIAGNOSIS — J45909 Unspecified asthma, uncomplicated: Secondary | ICD-10-CM | POA: Insufficient documentation

## 2014-01-27 DIAGNOSIS — R011 Cardiac murmur, unspecified: Secondary | ICD-10-CM | POA: Insufficient documentation

## 2014-01-27 DIAGNOSIS — G8929 Other chronic pain: Secondary | ICD-10-CM | POA: Insufficient documentation

## 2014-01-27 DIAGNOSIS — F17200 Nicotine dependence, unspecified, uncomplicated: Secondary | ICD-10-CM | POA: Insufficient documentation

## 2014-01-27 DIAGNOSIS — F410 Panic disorder [episodic paroxysmal anxiety] without agoraphobia: Secondary | ICD-10-CM | POA: Diagnosis not present

## 2014-01-27 DIAGNOSIS — K219 Gastro-esophageal reflux disease without esophagitis: Secondary | ICD-10-CM | POA: Diagnosis not present

## 2014-01-27 DIAGNOSIS — S199XXA Unspecified injury of neck, initial encounter: Secondary | ICD-10-CM

## 2014-01-27 DIAGNOSIS — I10 Essential (primary) hypertension: Secondary | ICD-10-CM | POA: Diagnosis not present

## 2014-01-27 DIAGNOSIS — S0083XA Contusion of other part of head, initial encounter: Secondary | ICD-10-CM | POA: Insufficient documentation

## 2014-01-27 MED ORDER — HYDROMORPHONE HCL PF 1 MG/ML IJ SOLN
1.0000 mg | Freq: Once | INTRAMUSCULAR | Status: AC
  Administered 2014-01-27: 1 mg via INTRAMUSCULAR
  Filled 2014-01-27: qty 1

## 2014-01-27 MED ORDER — PREDNISONE 20 MG PO TABS: 40.0000 mg | ORAL_TABLET | Freq: Every day | ORAL | Status: DC

## 2014-01-27 MED ORDER — OXYCODONE-ACETAMINOPHEN 5-325 MG PO TABS: 1.0000 | ORAL_TABLET | Freq: Four times a day (QID) | ORAL | Status: DC | PRN
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
## 2014-01-27 NOTE — Discharge Instructions (Signed)
Please follow up with your primary care physician in 1-2 days. If you do not have one please call the [facility] number listed above. Please take pain medication and/or muscle relaxants as prescribed and as needed for pain. Please do not drive on narcotic pain medication or on muscle relaxants. Please read all discharge instructions and return precautions.    Concussion A concussion, or closed-head injury, is a brain injury caused by a direct blow to the head or by a quick and sudden movement (jolt) of the head or neck. Concussions are usually not life-threatening. Even so, the effects of a concussion can be serious. If you have had a concussion before, you are more likely to experience concussion-like symptoms after a direct blow to the head.  CAUSES  Direct blow to the head, such as from running into another player during a soccer game, being hit in a fight, or hitting your head on a hard surface.  A jolt of the head or neck that causes the brain to move back and forth inside the skull, such as in a car crash. SIGNS AND SYMPTOMS The signs of a concussion can be hard to notice. Early on, they may be missed by you, family members, and health care providers. You may look fine but act or feel differently. Symptoms are usually temporary, but they may last for days, weeks, or even longer. Some symptoms may appear right away while others may not show up for hours or days. Every head injury is different. Symptoms include:  Mild to moderate headaches that will not go away.  A feeling of pressure inside your head.  Having more trouble than usual:  Learning or remembering things you have heard.  Answering questions.  Paying attention or concentrating.  Organizing daily tasks.  Making decisions and solving problems.  Slowness in thinking, acting or reacting, speaking, or reading.  Getting lost or being easily confused.  Feeling tired all the time or lacking energy  (fatigued).  Feeling drowsy.  Sleep disturbances.  Sleeping more than usual.  Sleeping less than usual.  Trouble falling asleep.  Trouble sleeping (insomnia).  Loss of balance or feeling lightheaded or dizzy.  Nausea or vomiting.  Numbness or tingling.  Increased sensitivity to:  Sounds.  Lights.  Distractions.  Vision problems or eyes that tire easily.  Diminished sense of taste or smell.  Ringing in the ears.  Mood changes such as feeling sad or anxious.  Becoming easily irritated or angry for little or no reason.  Lack of motivation.  Seeing or hearing things other people do not see or hear (hallucinations). DIAGNOSIS Your health care provider can usually diagnose a concussion based on a description of your injury and symptoms. He or she will ask whether you passed out (lost consciousness) and whether you are having trouble remembering events that happened right before and during your injury. Your evaluation might include:  A brain scan to look for signs of injury to the brain. Even if the test shows no injury, you may still have a concussion.  Blood tests to be sure other problems are not present. TREATMENT  Concussions are usually treated in an emergency department, in urgent care, or at a clinic. You may need to stay in the hospital overnight for further treatment.  Tell your health care provider if you are taking any medicines, including prescription medicines, over-the-counter medicines, and natural remedies. Some medicines, such as blood thinners (anticoagulants) and aspirin, may increase the chance of  complications. Also tell your health care provider whether you have had alcohol or are taking illegal drugs. This information may affect treatment. °· Your health care provider will send you home with important instructions to follow. °· How fast you will recover from a concussion depends on many factors. These factors include how severe your concussion is,  what part of your brain was injured, your age, and how healthy you were before the concussion. °· Most people with mild injuries recover fully. Recovery can take time. In general, recovery is slower in older persons. Also, persons who have had a concussion in the past or have other medical problems may find that it takes longer to recover from their current injury. °HOME CARE INSTRUCTIONS °General Instructions °· Carefully follow the directions your health care provider gave you. °· Only take over-the-counter or prescription medicines for pain, discomfort, or fever as directed by your health care provider. °· Take only those medicines that your health care provider has approved. °· Do not drink alcohol until your health care provider says you are well enough to do so. Alcohol and certain other drugs may slow your recovery and can put you at risk of further injury. °· If it is harder than usual to remember things, write them down. °· If you are easily distracted, try to do one thing at a time. For example, do not try to watch TV while fixing dinner. °· Talk with family members or close friends when making important decisions. °· Keep all follow-up appointments. Repeated evaluation of your symptoms is recommended for your recovery. °· Watch your symptoms and tell others to do the same. Complications sometimes occur after a concussion. Older adults with a brain injury may have a higher risk of serious complications, such as a blood clot on the brain. °· Tell your teachers, school nurse, school counselor, coach, athletic trainer, or work manager about your injury, symptoms, and restrictions. Tell them about what you can or cannot do. They should watch for: °¨ Increased problems with attention or concentration. °¨ Increased difficulty remembering or learning new information. °¨ Increased time needed to complete tasks or assignments. °¨ Increased irritability or decreased ability to cope with stress. °¨ Increased  symptoms. °· Rest. Rest helps the brain to heal. Make sure you: °¨ Get plenty of sleep at night. Avoid staying up late at night. °¨ Keep the same bedtime hours on weekends and weekdays. °¨ Rest during the day. Take daytime naps or rest breaks when you feel tired. °· Limit activities that require a lot of thought or concentration. These include: °¨ Doing homework or job-related work. °¨ Watching TV. °¨ Working on the computer. °· Avoid any situation where there is potential for another head injury (football, hockey, soccer, basketball, martial arts, downhill snow sports and horseback riding). Your condition will get worse every time you experience a concussion. You should avoid these activities until you are evaluated by the appropriate follow-up health care providers. °Returning To Your Regular Activities °You will need to return to your normal activities slowly, not all at once. You must give your body and brain enough time for recovery. °· Do not return to sports or other athletic activities until your health care provider tells you it is safe to do so. °· Ask your health care provider when you can drive, ride a bicycle, or operate heavy machinery. Your ability to react may be slower after a brain injury. Never do these activities if you are dizzy. °· Ask your health   care provider about when you can return to work or school. Preventing Another Concussion It is very important to avoid another brain injury, especially before you have recovered. In rare cases, another injury can lead to permanent brain damage, brain swelling, or death. The risk of this is greatest during the first 7-10 days after a head injury. Avoid injuries by:  Wearing a seat belt when riding in a car.  Drinking alcohol only in moderation.  Wearing a helmet when biking, skiing, skateboarding, skating, or doing similar activities.  Avoiding activities that could lead to a second concussion, such as contact or recreational sports, until  your health care provider says it is okay.  Taking safety measures in your home.  Remove clutter and tripping hazards from floors and stairways.  Use grab bars in bathrooms and handrails by stairs.  Place non-slip mats on floors and in bathtubs.  Improve lighting in dim areas. SEEK MEDICAL CARE IF:  You have increased problems paying attention or concentrating.  You have increased difficulty remembering or learning new information.  You need more time to complete tasks or assignments than before.  You have increased irritability or decreased ability to cope with stress.  You have more symptoms than before. Seek medical care if you have any of the following symptoms for more than 2 weeks after your injury:  Lasting (chronic) headaches.  Dizziness or balance problems.  Nausea.  Vision problems.  Increased sensitivity to noise or light.  Depression or mood swings.  Anxiety or irritability.  Memory problems.  Difficulty concentrating or paying attention.  Sleep problems.  Feeling tired all the time. SEEK IMMEDIATE MEDICAL CARE IF:  You have severe or worsening headaches. These may be a sign of a blood clot in the brain.  You have weakness (even if only in one hand, leg, or part of the face).  You have numbness.  You have decreased coordination.  You vomit repeatedly.  You have increased sleepiness.  One pupil is larger than the other.  You have convulsions.  You have slurred speech.  You have increased confusion. This may be a sign of a blood clot in the brain.  You have increased restlessness, agitation, or irritability.  You are unable to recognize people or places.  You have neck pain.  It is difficult to wake you up.  You have unusual behavior changes.  You lose consciousness. MAKE SURE YOU:  Understand these instructions.  Will watch your condition.  Will get help right away if you are not doing well or get worse. Document  Released: 07/25/2003 Document Revised: 05/09/2013 Document Reviewed: 11/24/2012 [facility] Patient Information 2015 [place], [place]. This information is not intended to replace advice given to you by your health care provider. Make sure you discuss any questions you have with your health care provider. Assault, General Assault includes any behavior, whether intentional or reckless, which results in bodily injury to another person and/or damage to property. Included in this would be any behavior, intentional or reckless, that by its nature would be understood (interpreted) by a reasonable person as intent to harm another person or to damage his/her property. Threats may be oral or written. They may be communicated through regular mail, computer, fax, or phone. These threats may be direct or implied. FORMS OF ASSAULT INCLUDE: Physically assaulting a person. This includes physical threats to inflict physical harm as well as: Slapping. Hitting. Poking. Kicking. Punching. Pushing. Arson. Sabotage. Equipment vandalism. Damaging or destroying property. Throwing or hitting objects. Displaying  a weapon or an object that appears to be a weapon in a threatening manner. Carrying a firearm of any kind. Using a weapon to harm someone. Using greater physical size/strength to intimidate another. Making intimidating or threatening gestures. Bullying. Hazing. Intimidating, threatening, hostile, or abusive language directed toward another person. It communicates the intention to engage in violence against that person. And it leads a reasonable person to expect that violent behavior may occur. Stalking another person. IF IT HAPPENS AGAIN: Immediately call for emergency help (911 in [place] If someone poses clear and immediate danger to you, seek legal authorities to have a protective or restraining order put in place. Less threatening assaults can at least be reported to authorities. STEPS TO TAKE IF A  SEXUAL ASSAULT HAS HAPPENED Go to an area of safety. This may include a shelter or staying with a friend. Stay away from the area where you have been attacked. A large percentage of sexual assaults are caused by a friend, relative or associate. If medications were given by your caregiver, take them as directed for the full length of time prescribed. Only take over-the-counter or prescription medicines for pain, discomfort, or fever as directed by your caregiver. If you have come in contact with a sexual disease, find out if you are to be tested again. If your caregiver is concerned about the HIV/AIDS virus, he/she may require you to have continued testing for several months. For the protection of your privacy, test results can not be given over the phone. Make sure you receive the results of your test. If your test results are not back during your visit, make an appointment with your caregiver to find out the results. Do not assume everything is normal if you have not heard from your caregiver or the medical facility. It is important for you to follow up on all of your test results. File appropriate papers with authorities. This is important in all assaults, even if it has occurred in a family or by a friend. SEEK MEDICAL CARE IF: You have new problems because of your injuries. You have problems that may be because of the medicine you are taking, such as: Rash. Itching. Swelling. Trouble breathing. You develop belly (abdominal) pain, feel sick to your stomach (nausea) or are vomiting. You begin to run a temperature. You need supportive care or referral to a rape crisis center. These are centers with trained personnel who can help you get through this ordeal. SEEK IMMEDIATE MEDICAL CARE IF: You are afraid of being threatened, beaten, or abused. In [place] call 911. You receive new injuries related to abuse. You develop severe pain in any area injured in the assault or have any change in your  condition that concerns you. You faint or lose consciousness. You develop chest pain or shortness of breath. Document Released: 05/04/2005 Document Revised: 07/27/2011 Document Reviewed: 12/21/2007 [facility] Patient Information 2015 [place], [place]. This information is not intended to replace advice given to you by your health care provider. Make sure you discuss any questions you have with your health care provider. Facial or Scalp Contusion A facial or scalp contusion is a deep bruise on the face or head. Injuries to the face and head generally cause a lot of swelling, especially around the eyes. Contusions are the result of an injury that caused bleeding under the skin. The contusion may turn blue, purple, or yellow. Minor injuries will give you a painless contusion, but more severe contusions may stay painful and swollen for a  few weeks.  CAUSES  A facial or scalp contusion is caused by a blunt injury or trauma to the face or head area.  SIGNS AND SYMPTOMS   Swelling of the injured area.   Discoloration of the injured area.   Tenderness, soreness, or pain in the injured area.  DIAGNOSIS  The diagnosis can be made by taking a medical history and doing a physical exam. An X-ray exam, CT scan, or MRI may be needed to determine if there are any associated injuries, such as broken bones (fractures). TREATMENT  Often, the best treatment for a facial or scalp contusion is applying cold compresses to the injured area. Over-the-counter medicines may also be recommended for pain control.  HOME CARE INSTRUCTIONS   Only take over-the-counter or prescription medicines as directed by your health care provider.   Apply ice to the injured area.   Put ice in a plastic bag.   Place a towel between your skin and the bag.   Leave the ice on for 20 minutes, 2-3 times a day.  SEEK MEDICAL CARE IF:  You have bite problems.   You have pain with chewing.   You are concerned about facial  defects. SEEK IMMEDIATE MEDICAL CARE IF:  You have severe pain or a headache that is not relieved by medicine.   You have unusual sleepiness, confusion, or personality changes.   You throw up (vomit).   You have a persistent nosebleed.   You have double vision or blurred vision.   You have fluid drainage from your nose or ear.   You have difficulty walking or using your arms or legs.  MAKE SURE YOU:   Understand these instructions.  Will watch your condition.  Will get help right away if you are not doing well or get worse. Document Released: 06/11/2004 Document Revised: 02/22/2013 Document Reviewed: 12/15/2012 [facility] Patient Information 2015 [place], [place]. This information is not intended to replace advice given to you by your health care provider. Make sure you discuss any questions you have with your health care provider.
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
## 2014-01-27 NOTE — ED Notes (Signed)
LEO at bedside

## 2014-01-27 NOTE — ED Notes (Signed)
Patient returned from CT scan.
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
## 2014-01-28 NOTE — ED Provider Notes (Signed)
Medical screening examination/treatment/procedure(s) were performed by non-physician practitioner and as supervising physician I was immediately available for consultation/collaboration.   Daizha Anand L Haileyann Staiger, MD 01/28/14 1021 

## 2014-02-04 ENCOUNTER — Encounter (HOSPITAL_COMMUNITY): Payer: Self-pay | Admitting: Emergency Medicine

## 2014-02-04 ENCOUNTER — Emergency Department (HOSPITAL_COMMUNITY)
Admission: EM | Admit: 2014-02-04 | Discharge: 2014-02-05 | Disposition: A | Discharge status: Home / Self Care | Payer: Medicaid Other | Attending: Emergency Medicine | Admitting: Emergency Medicine

## 2014-02-04 DIAGNOSIS — M5441 Lumbago with sciatica, right side: Secondary | ICD-10-CM

## 2014-02-04 DIAGNOSIS — Z8719 Personal history of other diseases of the digestive system: Secondary | ICD-10-CM | POA: Insufficient documentation

## 2014-02-04 DIAGNOSIS — Z8659 Personal history of other mental and behavioral disorders: Secondary | ICD-10-CM | POA: Diagnosis not present

## 2014-02-04 DIAGNOSIS — Z862 Personal history of diseases of the blood and blood-forming organs and certain disorders involving the immune mechanism: Secondary | ICD-10-CM | POA: Diagnosis not present

## 2014-02-04 DIAGNOSIS — J45909 Unspecified asthma, uncomplicated: Secondary | ICD-10-CM | POA: Insufficient documentation

## 2014-02-04 DIAGNOSIS — I10 Essential (primary) hypertension: Secondary | ICD-10-CM | POA: Diagnosis not present

## 2014-02-04 DIAGNOSIS — G894 Chronic pain syndrome: Secondary | ICD-10-CM | POA: Diagnosis not present

## 2014-02-04 DIAGNOSIS — M545 Low back pain, unspecified: Secondary | ICD-10-CM | POA: Diagnosis present

## 2014-02-04 DIAGNOSIS — M5430 Sciatica, unspecified side: Secondary | ICD-10-CM | POA: Diagnosis not present

## 2014-02-04 DIAGNOSIS — F17200 Nicotine dependence, unspecified, uncomplicated: Secondary | ICD-10-CM | POA: Insufficient documentation

## 2014-02-04 DIAGNOSIS — R011 Cardiac murmur, unspecified: Secondary | ICD-10-CM | POA: Diagnosis not present

## 2014-02-04 MED ORDER — HYDROMORPHONE HCL 2 MG/ML IJ SOLN
2.0000 mg | Freq: Once | INTRAMUSCULAR | Status: AC
  Administered 2014-02-04: 2 mg via INTRAMUSCULAR

## 2014-02-04 MED ORDER — DIAZEPAM 5 MG/ML IJ SOLN
5.0000 mg | Freq: Once | INTRAMUSCULAR | Status: AC
  Administered 2014-02-04: 5 mg via INTRAMUSCULAR
  Filled 2014-02-04: qty 2

## 2014-02-04 MED ORDER — HYDROMORPHONE HCL 2 MG/ML IJ SOLN
2.0000 mg | Freq: Once | INTRAMUSCULAR | Status: DC
  Filled 2014-02-04: qty 1
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
## 2014-02-05 MED ORDER — PREDNISONE 20 MG PO TABS: ORAL_TABLET | ORAL | Status: DC

## 2014-02-05 MED ORDER — OXYCODONE-ACETAMINOPHEN 5-325 MG PO TABS: 1.0000 | ORAL_TABLET | ORAL | Status: DC | PRN

## 2014-02-05 MED ORDER — HYDROMORPHONE HCL 1 MG/ML IJ SOLN
1.0000 mg | Freq: Once | INTRAMUSCULAR | Status: DC
  Filled 2014-02-05: qty 1

## 2014-02-05 MED ORDER — METHOCARBAMOL 500 MG PO TABS: 500.0000 mg | ORAL_TABLET | Freq: Two times a day (BID) | ORAL | Status: DC

## 2014-02-05 MED ORDER — MELOXICAM 15 MG PO TABS: 15.0000 mg | ORAL_TABLET | Freq: Every day | ORAL | Status: DC

## 2014-02-05 MED ORDER — DICLOFENAC SODIUM 1 % TD GEL: 1.0000 "application " | Freq: Four times a day (QID) | TRANSDERMAL | Status: DC

## 2014-02-05 MED ORDER — HYDROMORPHONE HCL 1 MG/ML IJ SOLN
1.0000 mg | Freq: Once | INTRAMUSCULAR | Status: AC
  Administered 2014-02-05: 1 mg via INTRAMUSCULAR

## 2014-02-05 NOTE — Discharge Instructions (Signed)
1. Medications: robaxin, mobic, Percocet, prednisone, Voltaren gel usual home medications 2. Treatment: rest, drink plenty of fluids, gentle stretching as discussed, alternate ice and heat 3. Follow Up: Please followup with your primary doctor for discussion of your diagnoses and further evaluation after today's visit; if you do not have a primary care doctor use the resource guide provided to find one;  please also followup with your pain management clinic. Return to emergency department for loss of bowel or bladder control or other concerning symptoms    Back Exercises Back exercises help treat and prevent back injuries. The goal of back exercises is to increase the strength of your abdominal and back muscles and the flexibility of your back. These exercises should be started when you no longer have back pain. Back exercises include:  Pelvic Tilt. Lie on your back with your knees bent. Tilt your pelvis until the lower part of your back is against the floor. Hold this position 5 to 10 sec and repeat 5 to 10 times.  Knee to Chest. Pull first 1 knee up against your chest and hold for 20 to 30 seconds, repeat this with the other knee, and then both knees. This may be done with the other leg straight or bent, whichever feels better.  Sit-Ups or Curl-Ups. Bend your knees 90 degrees. Start with tilting your pelvis, and do a partial, slow sit-up, lifting your trunk only 30 to 45 degrees off the floor. Take at least 2 to 3 seconds for each sit-up. Do not do sit-ups with your knees out straight. If partial sit-ups are difficult, simply do the above but with only tightening your abdominal muscles and holding it as directed.  Hip-Lift. Lie on your back with your knees flexed 90 degrees. Push down with your feet and shoulders as you raise your hips a couple inches off the floor; hold for 10 seconds, repeat 5 to 10 times.  Back arches. Lie on your stomach, propping yourself up on bent elbows. Slowly press on  your hands, causing an arch in your low back. Repeat 3 to 5 times. Any initial stiffness and discomfort should lessen with repetition over time.  Shoulder-Lifts. Lie face down with arms beside your body. Keep hips and torso pressed to floor as you slowly lift your head and shoulders off the floor. Do not overdo your exercises, especially in the beginning. Exercises may cause you some mild back discomfort which lasts for a few minutes; however, if the pain is more severe, or lasts for more than 15 minutes, do not continue exercises until you see your caregiver. Improvement with exercise therapy for back problems is slow.  See your caregivers for assistance with developing a proper back exercise program. Document Released: 06/11/2004 Document Revised: 07/27/2011 Document Reviewed: 03/05/2011 [facility] Patient Information 2015 [place], [place]. This information is not intended to replace advice given to you by your health care provider. Make sure you discuss any questions you have with your health care provider.   Sciatica Sciatica is pain, weakness, numbness, or tingling along the path of the sciatic nerve. The nerve starts in the lower back and runs down the back of each leg. The nerve controls the muscles in the lower leg and in the back of the knee, while also providing sensation to the back of the thigh, lower leg, and the sole of your foot. Sciatica is a symptom of another medical condition. For instance, nerve damage or certain conditions, such as a herniated disk or bone spur on  the spine, pinch or put pressure on the sciatic nerve. This causes the pain, weakness, or other sensations normally associated with sciatica. Generally, sciatica only affects one side of the body. CAUSES   Herniated or slipped disc.  Degenerative disk disease.  A pain disorder involving the narrow muscle in the buttocks (piriformis syndrome).  Pelvic injury or fracture.  Pregnancy.  Tumor (rare). SYMPTOMS    Symptoms can vary from mild to very severe. The symptoms usually travel from the low back to the buttocks and down the back of the leg. Symptoms can include:  Mild tingling or dull aches in the lower back, leg, or hip.  Numbness in the back of the calf or sole of the foot.  Burning sensations in the lower back, leg, or hip.  Sharp pains in the lower back, leg, or hip.  Leg weakness.  Severe back pain inhibiting movement. These symptoms may get worse with coughing, sneezing, laughing, or prolonged sitting or standing. Also, being overweight may worsen symptoms. DIAGNOSIS  Your caregiver will perform a physical exam to look for common symptoms of sciatica. He or she may ask you to do certain movements or activities that would trigger sciatic nerve pain. Other tests may be performed to find the cause of the sciatica. These may include:  Blood tests.  X-rays.  Imaging tests, such as an MRI or CT scan. TREATMENT  Treatment is directed at the cause of the sciatic pain. Sometimes, treatment is not necessary and the pain and discomfort goes away on its own. If treatment is needed, your caregiver may suggest:  Over-the-counter medicines to relieve pain.  Prescription medicines, such as anti-inflammatory medicine, muscle relaxants, or narcotics.  Applying heat or ice to the painful area.  Steroid injections to lessen pain, irritation, and inflammation around the nerve.  Reducing activity during periods of pain.  Exercising and stretching to strengthen your abdomen and improve flexibility of your spine. Your caregiver may suggest losing weight if the extra weight makes the back pain worse.  Physical therapy.  Surgery to eliminate what is pressing or pinching the nerve, such as a bone spur or part of a herniated disk. HOME CARE INSTRUCTIONS   Only take over-the-counter or prescription medicines for pain or discomfort as directed by your caregiver.  Apply ice to the affected area for  20 minutes, 3-4 times a day for the first 48-72 hours. Then try heat in the same way.  Exercise, stretch, or perform your usual activities if these do not aggravate your pain.  Attend physical therapy sessions as directed by your caregiver.  Keep all follow-up appointments as directed by your caregiver.  Do not wear high heels or shoes that do not provide proper support.  Check your mattress to see if it is too soft. A firm mattress may lessen your pain and discomfort. SEEK IMMEDIATE MEDICAL CARE IF:   You lose control of your bowel or bladder (incontinence).  You have increasing weakness in the lower back, pelvis, buttocks, or legs.  You have redness or swelling of your back.  You have a burning sensation when you urinate.  You have pain that gets worse when you lie down or awakens you at night.  Your pain is worse than you have experienced in the past.  Your pain is lasting longer than 4 weeks.  You are suddenly losing weight without reason. MAKE SURE YOU:  Understand these instructions.  Will watch your condition.  Will get help right away if you are  not doing well or get worse. Document Released: 04/28/2001 Document Revised: 11/03/2011 Document Reviewed: 09/13/2011 [facility] Patient Information 2015 [place], [place]. This information is not intended to replace advice given to you by your health care provider. Make sure you discuss any questions you have with your health care provider.

## 2014-02-05 NOTE — ED Notes (Signed)
Patient is alert and oriented x3.  She was given DC instructions and follow up visit instructions.  Patient gave verbal understanding. She was DC ambulatory under her own power to home.  V/S stable.  He was not showing any signs of distress on DC 

## 2014-02-05 NOTE — ED Provider Notes (Signed)
Medical screening examination/treatment/procedure(s) were performed by non-physician practitioner and as supervising physician I was immediately available for consultation/collaboration.  Toy Cookey, MD 02/05/14 1302
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
## 2014-02-16 ENCOUNTER — Emergency Department (HOSPITAL_COMMUNITY): Payer: Medicaid Other

## 2014-02-16 ENCOUNTER — Emergency Department (HOSPITAL_COMMUNITY)
Admission: EM | Admit: 2014-02-16 | Discharge: 2014-02-17 | Disposition: A | Discharge status: Home / Self Care | Payer: Medicaid Other | Attending: Emergency Medicine | Admitting: Emergency Medicine

## 2014-02-16 ENCOUNTER — Encounter (HOSPITAL_COMMUNITY): Payer: Self-pay | Admitting: Emergency Medicine

## 2014-02-16 DIAGNOSIS — I10 Essential (primary) hypertension: Secondary | ICD-10-CM | POA: Insufficient documentation

## 2014-02-16 DIAGNOSIS — Z862 Personal history of diseases of the blood and blood-forming organs and certain disorders involving the immune mechanism: Secondary | ICD-10-CM | POA: Insufficient documentation

## 2014-02-16 DIAGNOSIS — J45909 Unspecified asthma, uncomplicated: Secondary | ICD-10-CM | POA: Insufficient documentation

## 2014-02-16 DIAGNOSIS — Z8659 Personal history of other mental and behavioral disorders: Secondary | ICD-10-CM | POA: Insufficient documentation

## 2014-02-16 DIAGNOSIS — Z7952 Long term (current) use of systemic steroids: Secondary | ICD-10-CM | POA: Diagnosis not present

## 2014-02-16 DIAGNOSIS — G894 Chronic pain syndrome: Secondary | ICD-10-CM | POA: Diagnosis not present

## 2014-02-16 DIAGNOSIS — Z720 Tobacco use: Secondary | ICD-10-CM | POA: Insufficient documentation

## 2014-02-16 DIAGNOSIS — Z3202 Encounter for pregnancy test, result negative: Secondary | ICD-10-CM | POA: Diagnosis not present

## 2014-02-16 DIAGNOSIS — M5442 Lumbago with sciatica, left side: Secondary | ICD-10-CM

## 2014-02-16 DIAGNOSIS — Z8719 Personal history of other diseases of the digestive system: Secondary | ICD-10-CM | POA: Insufficient documentation

## 2014-02-16 DIAGNOSIS — Z791 Long term (current) use of non-steroidal anti-inflammatories (NSAID): Secondary | ICD-10-CM | POA: Insufficient documentation

## 2014-02-16 DIAGNOSIS — M5440 Lumbago with sciatica, unspecified side: Secondary | ICD-10-CM | POA: Insufficient documentation

## 2014-02-16 DIAGNOSIS — Z79899 Other long term (current) drug therapy: Secondary | ICD-10-CM | POA: Insufficient documentation

## 2014-02-16 DIAGNOSIS — M5441 Lumbago with sciatica, right side: Secondary | ICD-10-CM

## 2014-02-16 DIAGNOSIS — R011 Cardiac murmur, unspecified: Secondary | ICD-10-CM | POA: Insufficient documentation

## 2014-02-16 DIAGNOSIS — M7989 Other specified soft tissue disorders: Secondary | ICD-10-CM | POA: Diagnosis not present

## 2014-02-16 LAB — URINALYSIS, ROUTINE W REFLEX MICROSCOPIC
Bilirubin Urine: NEGATIVE
GLUCOSE, UA: NEGATIVE mg/dL
Hgb urine dipstick: NEGATIVE
Ketones, ur: NEGATIVE mg/dL
Leukocytes, UA: NEGATIVE
Nitrite: NEGATIVE
PH: 7 (ref 5.0–8.0)
Protein, ur: NEGATIVE mg/dL
SPECIFIC GRAVITY, URINE: 1.007 (ref 1.005–1.030)
Urobilinogen, UA: 0.2 mg/dL (ref 0.0–1.0)

## 2014-02-16 LAB — CBC WITH DIFFERENTIAL/PLATELET
Basophils Absolute: 0 10*3/uL (ref 0.0–0.1)
Basophils Relative: 0 % (ref 0–1)
EOS PCT: 3 % (ref 0–5)
Eosinophils Absolute: 0.3 10*3/uL (ref 0.0–0.7)
HCT: 39.3 % (ref 36.0–46.0)
Hemoglobin: 12.9 g/dL (ref 12.0–15.0)
LYMPHS ABS: 2.4 10*3/uL (ref 0.7–4.0)
LYMPHS PCT: 20 % (ref 12–46)
MCH: 30.4 pg (ref 26.0–34.0)
MCHC: 32.8 g/dL (ref 30.0–36.0)
MCV: 92.5 fL (ref 78.0–100.0)
Monocytes Absolute: 0.6 10*3/uL (ref 0.1–1.0)
Monocytes Relative: 5 % (ref 3–12)
Neutro Abs: 8.3 10*3/uL — ABNORMAL HIGH (ref 1.7–7.7)
Neutrophils Relative %: 72 % (ref 43–77)
Platelets: 319 10*3/uL (ref 150–400)
RBC: 4.25 MIL/uL (ref 3.87–5.11)
RDW: 13.6 % (ref 11.5–15.5)
WBC: 11.6 10*3/uL — AB (ref 4.0–10.5)

## 2014-02-16 LAB — BASIC METABOLIC PANEL
Anion gap: 10 (ref 5–15)
BUN: 7 mg/dL (ref 6–23)
CALCIUM: 8.9 mg/dL (ref 8.4–10.5)
CO2: 28 meq/L (ref 19–32)
Chloride: 102 mEq/L (ref 96–112)
Creatinine, Ser: 0.7 mg/dL (ref 0.50–1.10)
GFR calc Af Amer: >90 mL/min (ref >90)
GFR calc non Af Amer: >90 mL/min (ref >90)
GLUCOSE: 92 mg/dL (ref 70–99)
Potassium: 3.5 mEq/L — ABNORMAL LOW (ref 3.7–5.3)
SODIUM: 140 meq/L (ref 137–147)

## 2014-02-16 LAB — PRO B NATRIURETIC PEPTIDE: PRO B NATRI PEPTIDE: 143.2 pg/mL — AB (ref 0–125)

## 2014-02-16 LAB — POC URINE PREG, ED: PREG TEST UR: NEGATIVE

## 2014-02-16 LAB — TROPONIN I: Troponin I: <0.3 ng/mL (ref <0.30)

## 2014-02-16 MED ORDER — FUROSEMIDE 40 MG PO TABS: 40.0000 mg | ORAL_TABLET | Freq: Every day | ORAL | Status: DC

## 2014-02-16 MED ORDER — METOCLOPRAMIDE HCL 10 MG PO TABS
10.0000 mg | ORAL_TABLET | Freq: Once | ORAL | Status: AC
  Administered 2014-02-16: 10 mg via ORAL
  Filled 2014-02-16: qty 1

## 2014-02-16 MED ORDER — KETOROLAC TROMETHAMINE 60 MG/2ML IM SOLN
30.0000 mg | Freq: Once | INTRAMUSCULAR | Status: AC
  Administered 2014-02-16: 30 mg via INTRAMUSCULAR
  Filled 2014-02-16: qty 2

## 2014-02-16 MED ORDER — DIPHENHYDRAMINE HCL 25 MG PO CAPS
25.0000 mg | ORAL_CAPSULE | Freq: Once | ORAL | Status: AC
  Administered 2014-02-16: 25 mg via ORAL
  Filled 2014-02-16: qty 1

## 2014-02-16 NOTE — Discharge Instructions (Signed)
Back Pain, Adult Back pain is very common. The pain often gets better over time. The cause of back pain is usually not dangerous. Most people can learn to manage their back pain on their own.  HOME CARE   Stay active. Start with short walks on flat ground if you can. Try to walk farther each day.  Do not sit, drive, or stand in one place for more than 30 minutes. Do not stay in bed.  Do not avoid exercise or work. Activity can help your back heal faster.  Be careful when you bend or lift an object. Bend at your knees, keep the object close to you, and do not twist.  Sleep on a firm mattress. Lie on your side, and bend your knees. If you lie on your back, put a pillow under your knees.  Only take medicines as told by your doctor.  Put ice on the injured area.  Put ice in a plastic bag.  Place a towel between your skin and the bag.  Leave the ice on for 15-20 minutes, 03-04 times a day for the first 2 to 3 days. After that, you can switch between ice and heat packs.  Ask your doctor about back exercises or massage.  Avoid feeling anxious or stressed. Find good ways to deal with stress, such as exercise. GET HELP RIGHT AWAY IF:   Your pain does not go away with rest or medicine.  Your pain does not go away in 1 week.  You have new problems.  You do not feel well.  The pain spreads into your legs.  You cannot control when you poop (bowel movement) or pee (urinate).  Your arms or legs feel weak or lose feeling (numbness).  You feel sick to your stomach (nauseous) or throw up (vomit).  You have belly (abdominal) pain.  You feel like you may pass out (faint). MAKE SURE YOU:   Understand these instructions.  Will watch your condition.  Will get help right away if you are not doing well or get worse. Document Released: 10/21/2007 Document Revised: 07/27/2011 Document Reviewed: 09/05/2013 [facility] Patient Information 2015 [place], [place]. This information is not  intended to replace advice given to you by your health care provider. Make sure you discuss any questions you have with your health care provider. Edema Edema is an abnormal buildup of fluids in your bodytissues. Edema is somewhatdependent on gravity to pull the fluid to the lowest place in your body. That makes the condition more common in the legs and thighs (lower extremities). Painless swelling of the feet and ankles is common and becomes more likely as you get older. It is also common in looser tissues, like around your eyes.  When the affected area is squeezed, the fluid may move out of that spot and leave a dent for a few moments. This dent is called pitting.  CAUSES  There are many possible causes of edema. Eating too much salt and being on your feet or sitting for a long time can cause edema in your legs and ankles. Hot weather may make edema worse. Common medical causes of edema include:  Heart failure.  Liver disease.  Kidney disease.  Weak blood vessels in your legs.  Cancer.  An injury.  Pregnancy.  Some medications.  Obesity. SYMPTOMS  Edema is usually painless.Your skin may look swollen or shiny.  DIAGNOSIS  Your health care provider may be able to diagnose edema by asking about your medical history and doing  a physical exam. You may need to have tests such as X-rays, an electrocardiogram, or blood tests to check for medical conditions that may cause edema.  TREATMENT  Edema treatment depends on the cause. If you have heart, liver, or kidney disease, you need the treatment appropriate for these conditions. General treatment may include:  Elevation of the affected body part above the level of your heart.  Compression of the affected body part. Pressure from elastic bandages or support stockings squeezes the tissues and forces fluid back into the blood vessels. This keeps fluid from entering the tissues.  Restriction of fluid and salt intake.  Use of a water pill  (diuretic). These medications are appropriate only for some types of edema. They pull fluid out of your body and make you urinate more often. This gets rid of fluid and reduces swelling, but diuretics can have side effects. Only use diuretics as directed by your health care provider. HOME CARE INSTRUCTIONS   Keep the affected body part above the level of your heart when you are lying down.   Do not sit still or stand for prolonged periods.   Do not put anything directly under your knees when lying down.  Do not wear constricting clothing or garters on your upper legs.   Exercise your legs to work the fluid back into your blood vessels. This may help the swelling go down.   Wear elastic bandages or support stockings to reduce ankle swelling as directed by your health care provider.   Eat a low-salt diet to reduce fluid if your health care provider recommends it.   Only take medicines as directed by your health care provider. SEEK MEDICAL CARE IF:   Your edema is not responding to treatment.  You have heart, liver, or kidney disease and notice symptoms of edema.  You have edema in your legs that does not improve after elevating them.   You have sudden and unexplained weight gain. SEEK IMMEDIATE MEDICAL CARE IF:   You develop shortness of breath or chest pain.   You cannot breathe when you lie down.  You develop pain, redness, or warmth in the swollen areas.   You have heart, liver, or kidney disease and suddenly get edema.  You have a fever and your symptoms suddenly get worse. MAKE SURE YOU:   Understand these instructions.  Will watch your condition.  Will get help right away if you are not doing well or get worse. Document Released: 05/04/2005 Document Revised: 09/18/2013 Document Reviewed: 02/24/2013 [facility] Patient Information 2015 [place], [place]. This information is not intended to replace advice given to you by your health care provider. Make sure you  discuss any questions you have with your health care provider.   Emergency Department Resource Guide 1) Find a Doctor and Pay Out of Pocket Although you won't have to find out who is covered by your insurance plan, it is a good idea to ask around and get recommendations. You will then need to call the office and see if the doctor you have chosen will accept you as a new patient and what types of options they offer for patients who are self-pay. Some doctors offer discounts or will set up payment plans for their patients who do not have insurance, but you will need to ask so you aren't surprised when you get to your appointment.  2) Contact Your Local Health Department Not all health departments have doctors that can see patients for sick visits, but many do,  so it is worth a call to see if yours does. If you don't know where your local health department is, you can check in your phone book. The CDC also has a tool to help you locate your state's health department, and many state websites also have listings of all of their local health departments.  3) Find a Walk-in Clinic If your illness is not likely to be very severe or complicated, you may want to try a walk in clinic. These are popping up all over the country in pharmacies, drugstores, and shopping centers. They're usually staffed by nurse practitioners or physician assistants that have been trained to treat common illnesses and complaints. They're usually fairly quick and inexpensive. However, if you have serious medical issues or chronic medical problems, these are probably not your best option.  No Primary Care Doctor: - Call Health Connect at  [phone] - they can help you locate a primary care doctor that  accepts your insurance, provides certain services, etc. - Physician Referral Service- [phone]  Chronic Pain Problems: Organization         Address  Phone   Notes  [facility]  [phone] Patients need to  be referred by their primary care doctor.   Medication Assistance: Organization         Address  Phone   Notes  [facility] Medication [facility] [address] [place] [place], [place] [address] [phone] --Must be a resident of [facility] -- Must have NO insurance coverage whatsoever (no Medicaid/ Medicare, etc.) -- The pt. MUST have a primary care doctor that directs their care regularly and follows them in the community   [facility]  [phone]   [employer]  [phone]    Agencies that provide inexpensive medical care: Organization         Address  Phone   Notes  [facility]  [phone]   [facility]    [phone]   [facility] [address] [place], [place] [address] [phone]   [facility] [address], [place] [phone]   Planned Parenthood    [phone]   [facility]    [phone]   [facility]  [address], [place] Phone:  [phone], Fax:  [phone] Hours of Operation:  9 am - 6 pm, M-F.  Also accepts Medicaid/Medicare and self-pay.  [facility]  [address], [place] Phone: [phone], Fax: [phone]. Hours of Operation:  8:30 am - 5:30 pm, M-F.  Also accepts Medicaid and self-pay.  [facility] [place] [address] [place] Phone: [phone]   [facility] [address] Natasha Bence [place], [place] [phone], Ext. [phone] Mondays & Thursdays: 7-9 AM.  First 15 patients are seen on a first come, first serve basis.    Medicaid-accepting [facility] Providers:  Organization         Address  Phone   Notes  [facility] [address], [place] [phone] Also accepts self-pay patients.  [facility] [address] Laurell Josephs [place], [place]  [phone]   [facility] [address], [place] [phone]   [facility] [address] [place] [phone]   Renaye Rakers [phone]  [address], [place]   [phone] Only accepts [place] patients after they have their name applied to their card.   Self-Pay (no insurance) in [facility]:  Organization         Address  Phone   Notes  Sickle Cell Patients, [facility] [address] [place], [place] [phone]   [facility] [address] [place], [place] [phone]   [facility] [place]  [address], [place] [phone]   [facility]/Dr. Osei-Bonsu  [address], [place] or [address], [place] [phone] Phone number for both [place] and [place] locations is the same.  [facility] [address] [place] [address], [place] [phone]   [facility] [address], [place] or [address] [phone]   [facility] [address] [place] [phone], phone; [phone], fax Sees patients 1st and 3rd Saturday of every month.  Must not qualify for public or private insurance (i.e. Medicaid, Medicare, [place] Health Choice, Veterans' Benefits)  Household income should be no more than 200% of the poverty level The clinic cannot treat you if you are pregnant or think you are pregnant  Sexually transmitted diseases are not treated at the clinic.    Dental Care: Organization         Address  Phone  Notes  [facility] [address] [place], [place] [phone] Accepts children up to age 69 who are enrolled in [place] or [place] Health Choice; pregnant women with a Medicaid card; and children who have applied for Medicaid or [place] Health Choice, but were declined, whose parents can  pay a reduced fee at time of service.  [facility] Department of [facility]  [address], [place] [phone] Accepts children up to age 67 who are enrolled in [place] or [place] Health Choice; pregnant women with a Medicaid card; and children who have applied for Medicaid or [place] Health Choice, but were declined, whose parents can pay a reduced fee at time of service.  [facility]  [address] [place], [place] [phone] Patients are seen by appointment only. Walk-ins are not accepted. [facility] will see patients 19 years of age and older. Monday - Tuesday (8am-5pm) Most Wednesdays (8:30-5pm) $30 per visit, cash only  [facility] Adult [facility] Access [facility]  [address], [facility] [phone] Patients are seen by appointment only. Walk-ins are not accepted. [facility] will see patients 33 years of age and older. One Wednesday Evening (Monthly: Volunteer Based).  $30 per visit, cash only  [employer]  [phone] for adults; Children under age 53, call Graduate Pediatric Dentistry at [phone]. Children aged 63-14, please call [phone] to request a pediatric application.  Dental services are provided in all areas of dental care including fillings, crowns and bridges, complete and partial dentures, implants, gum treatment, root canals, and extractions. Preventive care is also provided. Treatment is provided to both adults and children. Patients are selected via a lottery and there is often a waiting list.   [facility] [address], [place]  [phone] [email or web address]   [facility] [address] [place], [place] [phone], Ext. [phone] Second and Fourth  Thursday of each month, opens at 6:30 AM; Clinic ends at 9 AM.  Patients are seen on a first-come first-served basis, and a limited number are seen during each clinic.   [facility]  [address], [place], [place] [phone]   Eligibility Requirements You must have lived in [place], [place], or [place] counties for at least the last three months.   You cannot be eligible for state or federal sponsored National Cityhealthcare insurance, including CIG[employer]eterans Administration, [place]icaid, or [employer]icare.   You generally cannot be eligible for healthcare insurance through your employer.    How to apply: Eligibility screenings are held every Tuesday and Wednesday afternoon from 1:00 pm until 4:00 pm. You do not need an appointment for the interview!  [facility] [address], [place], [place] [phone]   [facility]  [phone]   [facility]  [phone]   [facility]  [phone]    Behavioral Health Resources in the Community: Intensive Outpatient Programs Organization         Address  Phone  Notes  [facility] [address], [place], [place] [phone]   [facility] Outpatient [address], [place], [place] [phone]   [facility] [address], [place], [place]  [phone]   [facility] [address],  [place], [place] [phone] or [phone]   Substance Abuse Resources Organization         Address  Phone  Notes  [facility]  [phone]   [facility]  [phone]   [facility]  [phone]   [facility]  [phone]   Residential & Outpatient Substance Abuse Program  [phone]   Psychological Services Organization         Address  Phone  Notes  [facility]  [phone]- [phone]   [facility]  [phone]- [phone]   [facility] [address], [place] [phone] or [phone]    Mobile Crisis Teams Organization         Address  Phone  Notes  [facility], Mobile Crisis Care Unit  [phone]    [facility]  [address] [place], [place] [phone]   Doristine LocksSharon DeEsch [address] [place] [phone]    Self-Help/Support Groups Organization         Address  Phone             Notes  [facility] of [place] - variety of support groups  336- [phone] Call for more information  Narcotics Anonymous (NA), [facility] [address], [place]  2 meetings at this location   Statisticianesidential Treatment Programs Organization         Address  Phone  Notes  [facility] [address],    [place]  [phone]   [facility]  [address], [place] [phone], [place], [place] [phone]   [facility] [address], [place] [phone] Admissions: 8am-3pm M-F  [facility] [address]    [place], [place] [phone]   [facility] [address], [place], [place] [phone]   [facility] [address]  [place], [place] [phone]   [facility] - Intensive Outpatient [address], [place], [place] [phone]   [facility] ([facility] [address]  [place], [place] [phone] or [phone]   [facility] ([facility]) [address], [place], [place] [phone] Accepts Medicaid  [facility] [address],  [place] [phone] Substance Abuse/Addiction Treatment   [facility] Organization         Address  Phone  Notes  [facility]  [phone]   Angie Fava, PhD [address] Ervin Knack [place], [place]   [phone] or [phone]   [facility]   [address] [place], [place] [phone]   [facility] [address] [place], [place] [phone] Insurance/Medicaid/sponsorship through [facility] [address]                                     [place], [place] [phone] Therapy/tele-psych/case  [facility] [address], [place] [phone]    Dr. Lolly Mustache  [phone]   [facility] [place]  [employer] [facility] Dept. 1) [address] [place] 2) [address] [place] 3)  [address], [place] [phone]  [phone]   [facility] Child Abuse Hotline [phone] or [phone] (After Hours)
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
## 2014-03-19 ENCOUNTER — Encounter (HOSPITAL_COMMUNITY): Payer: Self-pay | Admitting: Emergency Medicine

## 2014-03-30 ENCOUNTER — Ambulatory Visit: Payer: Medicaid Other | Admitting: Cardiology

## 2022-03-10 ENCOUNTER — Emergency Department (HOSPITAL_BASED_OUTPATIENT_CLINIC_OR_DEPARTMENT_OTHER): Payer: Medicaid Other | Admitting: Radiology

## 2022-03-10 ENCOUNTER — Encounter (HOSPITAL_BASED_OUTPATIENT_CLINIC_OR_DEPARTMENT_OTHER): Payer: Self-pay | Admitting: Emergency Medicine

## 2022-03-10 ENCOUNTER — Encounter (HOSPITAL_COMMUNITY): Payer: Self-pay

## 2022-03-10 ENCOUNTER — Inpatient Hospital Stay (HOSPITAL_BASED_OUTPATIENT_CLINIC_OR_DEPARTMENT_OTHER)
Admission: EM | Admit: 2022-03-10 | Discharge: 2022-03-14 | DRG: 193 | Disposition: A | Discharge status: Home / Self Care | Payer: Medicaid Other | Attending: Internal Medicine | Admitting: Internal Medicine

## 2022-03-10 ENCOUNTER — Other Ambulatory Visit: Payer: Self-pay

## 2022-03-10 ENCOUNTER — Emergency Department (HOSPITAL_BASED_OUTPATIENT_CLINIC_OR_DEPARTMENT_OTHER): Payer: Medicaid Other

## 2022-03-10 DIAGNOSIS — Z8249 Family history of ischemic heart disease and other diseases of the circulatory system: Secondary | ICD-10-CM | POA: Diagnosis not present

## 2022-03-10 DIAGNOSIS — Z6841 Body Mass Index (BMI) 40.0 and over, adult: Secondary | ICD-10-CM | POA: Diagnosis present

## 2022-03-10 DIAGNOSIS — J45909 Unspecified asthma, uncomplicated: Secondary | ICD-10-CM | POA: Diagnosis not present

## 2022-03-10 DIAGNOSIS — Z883 Allergy status to other anti-infective agents status: Secondary | ICD-10-CM | POA: Diagnosis not present

## 2022-03-10 DIAGNOSIS — Z888 Allergy status to other drugs, medicaments and biological substances status: Secondary | ICD-10-CM | POA: Diagnosis not present

## 2022-03-10 DIAGNOSIS — G894 Chronic pain syndrome: Secondary | ICD-10-CM | POA: Diagnosis not present

## 2022-03-10 DIAGNOSIS — I10 Essential (primary) hypertension: Secondary | ICD-10-CM | POA: Diagnosis present

## 2022-03-10 DIAGNOSIS — F17201 Nicotine dependence, unspecified, in remission: Secondary | ICD-10-CM

## 2022-03-10 DIAGNOSIS — J189 Pneumonia, unspecified organism: Secondary | ICD-10-CM | POA: Diagnosis not present

## 2022-03-10 DIAGNOSIS — F410 Panic disorder [episodic paroxysmal anxiety] without agoraphobia: Secondary | ICD-10-CM | POA: Diagnosis present

## 2022-03-10 DIAGNOSIS — K219 Gastro-esophageal reflux disease without esophagitis: Secondary | ICD-10-CM | POA: Diagnosis present

## 2022-03-10 DIAGNOSIS — Z20822 Contact with and (suspected) exposure to covid-19: Secondary | ICD-10-CM | POA: Diagnosis present

## 2022-03-10 DIAGNOSIS — Z79899 Other long term (current) drug therapy: Secondary | ICD-10-CM | POA: Diagnosis not present

## 2022-03-10 DIAGNOSIS — Z9049 Acquired absence of other specified parts of digestive tract: Secondary | ICD-10-CM | POA: Diagnosis not present

## 2022-03-10 DIAGNOSIS — Z23 Encounter for immunization: Secondary | ICD-10-CM | POA: Diagnosis not present

## 2022-03-10 DIAGNOSIS — F17210 Nicotine dependence, cigarettes, uncomplicated: Secondary | ICD-10-CM | POA: Diagnosis not present

## 2022-03-10 DIAGNOSIS — J9601 Acute respiratory failure with hypoxia: Secondary | ICD-10-CM | POA: Diagnosis not present

## 2022-03-10 DIAGNOSIS — R911 Solitary pulmonary nodule: Secondary | ICD-10-CM | POA: Diagnosis present

## 2022-03-10 DIAGNOSIS — Z885 Allergy status to narcotic agent status: Secondary | ICD-10-CM | POA: Diagnosis not present

## 2022-03-10 DIAGNOSIS — B348 Other viral infections of unspecified site: Secondary | ICD-10-CM | POA: Diagnosis present

## 2022-03-10 DIAGNOSIS — Z720 Tobacco use: Secondary | ICD-10-CM

## 2022-03-10 DIAGNOSIS — Z87891 Personal history of nicotine dependence: Secondary | ICD-10-CM

## 2022-03-10 LAB — COMPREHENSIVE METABOLIC PANEL
ALT: 31 U/L (ref 0–44)
AST: 28 U/L (ref 15–41)
Albumin: 3.8 g/dL (ref 3.5–5.0)
Alkaline Phosphatase: 121 U/L (ref 38–126)
Anion gap: 9 (ref 5–15)
BUN: 10 mg/dL (ref 6–20)
CO2: 34 mmol/L — ABNORMAL HIGH (ref 22–32)
Calcium: 9.2 mg/dL (ref 8.9–10.3)
Chloride: 100 mmol/L (ref 98–111)
Creatinine, Ser: 0.59 mg/dL (ref 0.44–1.00)
GFR, Estimated: >60 mL/min (ref >60)
Glucose, Bld: 123 mg/dL — ABNORMAL HIGH (ref 70–99)
Potassium: 3.1 mmol/L — ABNORMAL LOW (ref 3.5–5.1)
Sodium: 143 mmol/L (ref 135–145)
Total Bilirubin: 0.6 mg/dL (ref 0.3–1.2)
Total Protein: 6.2 g/dL — ABNORMAL LOW (ref 6.5–8.1)

## 2022-03-10 LAB — RESPIRATORY PANEL BY PCR

## 2022-03-10 LAB — TROPONIN I (HIGH SENSITIVITY)
Troponin I (High Sensitivity): 36 ng/L — ABNORMAL HIGH (ref <18)
Troponin I (High Sensitivity): 26 ng/L — ABNORMAL HIGH (ref <18)

## 2022-03-10 LAB — BLOOD GAS, VENOUS
Acid-Base Excess: 8.7 mmol/L — ABNORMAL HIGH (ref 0.0–2.0)
Bicarbonate: 35 mmol/L — ABNORMAL HIGH (ref 20.0–28.0)
O2 Saturation: 53.2 %
Patient temperature: 37
pCO2, Ven: 54 mmHg (ref 44–60)
pH, Ven: 7.42 (ref 7.25–7.43)
pO2, Ven: 31 mmHg — CL (ref 32–45)

## 2022-03-10 LAB — CBC WITH DIFFERENTIAL/PLATELET
Abs Immature Granulocytes: 0.05 10*3/uL (ref 0.00–0.07)
Basophils Absolute: 0 10*3/uL (ref 0.0–0.1)
Basophils Relative: 0 %
Eosinophils Absolute: 0 10*3/uL (ref 0.0–0.5)
Eosinophils Relative: 0 %
HCT: 43.8 % (ref 36.0–46.0)
Hemoglobin: 14 g/dL (ref 12.0–15.0)
Immature Granulocytes: 1 %
Lymphocytes Relative: 20 %
Lymphs Abs: 1.4 10*3/uL (ref 0.7–4.0)
MCH: 29.4 pg (ref 26.0–34.0)
MCHC: 32 g/dL (ref 30.0–36.0)
MCV: 92 fL (ref 80.0–100.0)
Monocytes Absolute: 0.6 10*3/uL (ref 0.1–1.0)
Monocytes Relative: 8 %
Neutro Abs: 5.2 10*3/uL (ref 1.7–7.7)
Neutrophils Relative %: 71 %
Platelets: 255 10*3/uL (ref 150–400)
RBC: 4.76 MIL/uL (ref 3.87–5.11)
RDW: 12.6 % (ref 11.5–15.5)
WBC: 7.3 10*3/uL (ref 4.0–10.5)
nRBC: 0 % (ref 0.0–0.2)

## 2022-03-10 LAB — URINALYSIS, ROUTINE W REFLEX MICROSCOPIC
Bilirubin Urine: NEGATIVE
Glucose, UA: NEGATIVE mg/dL
Hgb urine dipstick: NEGATIVE
Ketones, ur: NEGATIVE mg/dL
Leukocytes,Ua: NEGATIVE
Nitrite: NEGATIVE
Specific Gravity, Urine: >1.046 — ABNORMAL HIGH (ref 1.005–1.030)
pH: 6.5 (ref 5.0–8.0)

## 2022-03-10 LAB — LACTIC ACID, PLASMA: Lactic Acid, Venous: 1.1 mmol/L (ref 0.5–1.9)

## 2022-03-10 LAB — RESP PANEL BY RT-PCR (FLU A&B, COVID) ARPGX2
Influenza A by PCR: NEGATIVE
Influenza B by PCR: NEGATIVE
SARS Coronavirus 2 by RT PCR: NEGATIVE

## 2022-03-10 LAB — HCG, QUANTITATIVE, PREGNANCY: hCG, Beta Chain, Quant, S: 4 m[IU]/mL (ref <5)

## 2022-03-10 LAB — BRAIN NATRIURETIC PEPTIDE: B Natriuretic Peptide: 281.2 pg/mL — ABNORMAL HIGH (ref 0.0–100.0)

## 2022-03-10 MED ORDER — SODIUM CHLORIDE 0.9 % IV SOLN
1.0000 g | Freq: Once | INTRAVENOUS | Status: AC
  Administered 2022-03-10: 1 g via INTRAVENOUS
  Filled 2022-03-10: qty 10

## 2022-03-10 MED ORDER — SODIUM CHLORIDE 0.9 % IV SOLN: INTRAVENOUS | Status: DC | PRN

## 2022-03-10 MED ORDER — ALBUTEROL SULFATE (2.5 MG/3ML) 0.083% IN NEBU: 2.5000 mg | INHALATION_SOLUTION | RESPIRATORY_TRACT | Status: DC | PRN

## 2022-03-10 MED ORDER — ENOXAPARIN SODIUM 40 MG/0.4ML IJ SOSY
40.0000 mg | PREFILLED_SYRINGE | INTRAMUSCULAR | Status: DC
  Administered 2022-03-10 – 2022-03-13 (×4): 40 mg via SUBCUTANEOUS
  Filled 2022-03-10 (×4): qty 0.4

## 2022-03-10 MED ORDER — IPRATROPIUM-ALBUTEROL 0.5-2.5 (3) MG/3ML IN SOLN
3.0000 mL | RESPIRATORY_TRACT | Status: DC
  Administered 2022-03-10 – 2022-03-14 (×20): 3 mL via RESPIRATORY_TRACT
  Filled 2022-03-10 (×20): qty 3

## 2022-03-10 MED ORDER — ALBUTEROL (5 MG/ML) CONTINUOUS INHALATION SOLN
10.0000 mg/h | INHALATION_SOLUTION | RESPIRATORY_TRACT | Status: DC
  Administered 2022-03-10: 10 mg/h via RESPIRATORY_TRACT
  Filled 2022-03-10: qty 20

## 2022-03-10 MED ORDER — POTASSIUM CHLORIDE CRYS ER 20 MEQ PO TBCR
40.0000 meq | EXTENDED_RELEASE_TABLET | Freq: Once | ORAL | Status: AC
  Administered 2022-03-10: 40 meq via ORAL
  Filled 2022-03-10: qty 2

## 2022-03-10 MED ORDER — IPRATROPIUM-ALBUTEROL 0.5-2.5 (3) MG/3ML IN SOLN
3.0000 mL | Freq: Once | RESPIRATORY_TRACT | Status: AC
  Administered 2022-03-10: 3 mL via RESPIRATORY_TRACT
  Filled 2022-03-10: qty 3

## 2022-03-10 MED ORDER — ALBUTEROL SULFATE (2.5 MG/3ML) 0.083% IN NEBU
INHALATION_SOLUTION | RESPIRATORY_TRACT | Status: AC
  Filled 2022-03-10: qty 12

## 2022-03-10 MED ORDER — ACETAMINOPHEN 325 MG PO TABS
650.0000 mg | ORAL_TABLET | Freq: Four times a day (QID) | ORAL | Status: DC | PRN
  Administered 2022-03-11 – 2022-03-13 (×5): 650 mg via ORAL
  Filled 2022-03-10 (×5): qty 2

## 2022-03-10 MED ORDER — PREDNISONE 50 MG PO TABS
50.0000 mg | ORAL_TABLET | Freq: Every day | ORAL | Status: DC
  Administered 2022-03-11: 50 mg via ORAL
  Filled 2022-03-10: qty 1

## 2022-03-10 MED ORDER — IOHEXOL 350 MG/ML SOLN
100.0000 mL | Freq: Once | INTRAVENOUS | Status: AC | PRN
  Administered 2022-03-10: 75 mL via INTRAVENOUS

## 2022-03-10 MED ORDER — SODIUM CHLORIDE 0.9% FLUSH
3.0000 mL | Freq: Two times a day (BID) | INTRAVENOUS | Status: DC
  Administered 2022-03-10 – 2022-03-14 (×8): 3 mL via INTRAVENOUS

## 2022-03-10 MED ORDER — ALBUTEROL SULFATE HFA 108 (90 BASE) MCG/ACT IN AERS: 2.0000 | INHALATION_SPRAY | RESPIRATORY_TRACT | Status: DC | PRN

## 2022-03-10 MED ORDER — POLYETHYLENE GLYCOL 3350 17 G PO PACK: 17.0000 g | PACK | Freq: Every day | ORAL | Status: DC | PRN

## 2022-03-10 MED ORDER — ALBUTEROL SULFATE (2.5 MG/3ML) 0.083% IN NEBU
INHALATION_SOLUTION | RESPIRATORY_TRACT | Status: AC
  Administered 2022-03-10: 5 mg via RESPIRATORY_TRACT
  Filled 2022-03-10: qty 6

## 2022-03-10 MED ORDER — ACETAMINOPHEN 500 MG PO TABS
1000.0000 mg | ORAL_TABLET | Freq: Once | ORAL | Status: AC
  Administered 2022-03-10: 1000 mg via ORAL
  Filled 2022-03-10: qty 2

## 2022-03-10 MED ORDER — METHYLPREDNISOLONE SODIUM SUCC 125 MG IJ SOLR
125.0000 mg | Freq: Once | INTRAMUSCULAR | Status: AC
  Administered 2022-03-10: 125 mg via INTRAVENOUS
  Filled 2022-03-10: qty 2

## 2022-03-10 MED ORDER — ACETAMINOPHEN 650 MG RE SUPP: 650.0000 mg | Freq: Four times a day (QID) | RECTAL | Status: DC | PRN

## 2022-03-10 MED ORDER — ALBUTEROL SULFATE (2.5 MG/3ML) 0.083% IN NEBU: 10.0000 mg/h | INHALATION_SOLUTION | RESPIRATORY_TRACT | Status: AC

## 2022-03-10 MED ORDER — SODIUM CHLORIDE 0.9 % IV SOLN
500.0000 mg | Freq: Once | INTRAVENOUS | Status: AC
  Administered 2022-03-10: 500 mg via INTRAVENOUS
  Filled 2022-03-10: qty 5

## 2022-03-10 NOTE — ED Notes (Signed)
Report given to the RN on the Floor.  

## 2022-03-10 NOTE — ED Notes (Signed)
Report attempted, RN not currently available.

## 2022-03-10 NOTE — Plan of Care (Signed)
[facility]   [name] (DOB 03-31-2071) MRN: [id number]  The patient is a 51 year old obese female who presents with flulike symptoms for last week with chest congestion.  She is noted to have an elevated BNP and complained of some chest discomfort because of cough and has had fever.  She does not have a history of the CHF, and does not appear volume overloaded but was wheezing and dyspneic on exertion.  Further imaging was done and shows atypical pneumonia.  She had to be placed on 4 L and then heated high flow nasal cannula 7 L due to her continued desaturations.  She continued be febrile and I asked the EDP to initiate antibiotics.  Patient was accepted to the progressive unit given her continued desaturations and high flow nasal cannula.  She will need further work-up for this atypical pneumonia and be treated with antibiotics and fluids with close monitoring of her respiratory status.[name]

## 2022-03-10 NOTE — ED Notes (Signed)
Urine was aware but unable to currently.

## 2022-03-10 NOTE — ED Triage Notes (Addendum)
Pt arrived POV. Pt caox4. Pt became SOB ambulating into triage RM. Pt reports last wed she began feeling "like she had cold" with congestion and chills and weakness started Sat. Expiratory wheezing in all fields.  Initial SpO2 after ambulating to triage 78% RA, increased to 83% at rest. Placed on O2 via [place] which increased SpO2 to low 90's. Neb initiated by RT.   Hx daily smoking

## 2022-03-10 NOTE — ED Notes (Signed)
Report given to [facility]. 

## 2022-03-10 NOTE — ED Provider Notes (Signed)
[facility] EMERGENCY DEPT Provider Note   CSN: [id number] Arrival date & time: 03/10/22  1003     History  No chief complaint on file.   [name] is a 51 y.o. female who presents to the emergency department with concerns for flulike symptoms onset last week.  Notes that she has sick contacts at home with her children who symptoms started after hers.  Has associated productive cough (white sputum), chest pain (due to the cough and noted as a stabbing sensation to the left-sided chest, left lateral ribs), fever, shortness of breath, nasal congestion.  She notes that her cough is worse when laying down at night.  She denies chest pain at rest, and notes that she only has chest pain after coughing persistently.  Has tried ibuprofen and Tylenol PM for her symptoms.  Does not wear oxygen at home.  Denies rhinorrhea, sore throat.  Patient notes that she smokes approximately 9 cigarettes daily for the past 7 years.  Denies past medical history of MI, cardiac catheterization, CAD, diabetes, stents, asthma, or COPD.  The history is provided by the patient. No language interpreter was used.       Home Medications Prior to Admission medications   Medication Sig Start Date End Date Taking? Authorizing Provider  furosemide (LASIX) 40 MG tablet Take 1 tablet (40 mg total) by mouth daily. 02/16/14   Mirian Mo, MD  meloxicam (MOBIC) 15 MG tablet Take 1 tablet (15 mg total) by mouth daily. 02/05/14   Muthersbaugh, Dahlia Client, PA-C  methocarbamol (ROBAXIN) 500 MG tablet Take 1 tablet (500 mg total) by mouth 2 (two) times daily. 02/05/14   Muthersbaugh, Dahlia Client, PA-C  oxyCODONE-acetaminophen (PERCOCET/ROXICET) 5-325 MG per tablet Take 1-2 tablets by mouth every 4 (four) hours as needed for moderate pain or severe pain. 02/05/14   Muthersbaugh, Dahlia Client, PA-C  predniSONE (DELTASONE) 20 MG tablet 3 tabs po daily x 3 days, then 2 tabs x 3 days, then 1.5 tabs x 3 days, then 1 tab x 3 days, then  0.5 tabs x 3 days 02/05/14   Muthersbaugh, Dahlia Client, PA-C      Allergies    Morphine and related, Fentanyl, Flagyl [metronidazole], Gabapentin, Paroxetine, and Vicodin [hydrocodone-acetaminophen]    Review of Systems   Review of Systems  Constitutional:  Positive for fever.  HENT:  Positive for congestion. Negative for rhinorrhea and sore throat.   Respiratory:  Positive for cough (white sputum) and shortness of breath.   Cardiovascular:  Positive for chest pain.  All other systems reviewed and are negative.   Physical Exam Updated Vital Signs BP (!) 176/108 (BP Location: Left Arm)   Pulse 90   Temp (!) 100.4 F (38 C) (Oral)   Resp (!) 30   SpO2 94%  Physical Exam Vitals and nursing note reviewed.  Constitutional:      General: She is not in acute distress.    Appearance: She is not diaphoretic.     Comments: Nasal cannula in place.  Patient with some difficulty speaking and complete sentences.  HENT:     Head: Normocephalic and atraumatic.     Mouth/Throat:     Pharynx: No oropharyngeal exudate.  Eyes:     General: No scleral icterus.    Conjunctiva/sclera: Conjunctivae normal.  Cardiovascular:     Rate and Rhythm: Normal rate and regular rhythm.     Pulses: Normal pulses.     Heart sounds: Normal heart sounds.  Pulmonary:     Effort: Pulmonary effort  is normal. No respiratory distress.     Breath sounds: Wheezing present.     Comments: Expiratory wheezing noted diffusely throughout lung fields. Abdominal:     General: Bowel sounds are normal.     Palpations: Abdomen is soft. There is no mass.     Tenderness: There is no abdominal tenderness. There is no guarding or rebound.  Musculoskeletal:        General: Normal range of motion.     Cervical back: Normal range of motion and neck supple.  Skin:    General: Skin is warm and dry.  Neurological:     Mental Status: She is alert.  Psychiatric:        Behavior: Behavior normal.     ED Results / Procedures /  Treatments   Labs (all labs ordered are listed, but only abnormal results are displayed) Labs Reviewed  COMPREHENSIVE METABOLIC PANEL - Abnormal; Notable for the following components:      Result Value   Potassium 3.1 (*)    CO2 34 (*)    Glucose, Bld 123 (*)    Total Protein 6.2 (*)    All other components within normal limits  BRAIN NATRIURETIC PEPTIDE - Abnormal; Notable for the following components:   B Natriuretic Peptide 281.2 (*)    All other components within normal limits  TROPONIN I (HIGH SENSITIVITY) - Abnormal; Notable for the following components:   Troponin I (High Sensitivity) 36 (*)    All other components within normal limits  TROPONIN I (HIGH SENSITIVITY) - Abnormal; Notable for the following components:   Troponin I (High Sensitivity) 26 (*)    All other components within normal limits  RESP PANEL BY RT-PCR (FLU A&B, COVID) ARPGX2  CULTURE, BLOOD (ROUTINE X 2)  CULTURE, BLOOD (ROUTINE X 2)  RESPIRATORY PANEL BY PCR  LACTIC ACID, PLASMA  CBC WITH DIFFERENTIAL/PLATELET  HCG, QUANTITATIVE, PREGNANCY  LACTIC ACID, PLASMA  URINALYSIS, ROUTINE W REFLEX MICROSCOPIC    EKG EKG Interpretation  Date/Time:  Tuesday March 10 2022 10:45:16 EDT Ventricular Rate:  67 PR Interval:  116 QRS Duration: 80 QT Interval:  418 QTC Calculation: 441 R Axis:   34 Text Interpretation: Normal sinus rhythm Anterior infarct , age undetermined ST & T wave abnormality, consider inferolateral ischemia Abnormal ECG st changes not seen on prior from 12 years ago Otherwise no significant change Confirmed by Melene Plan [phone]) on 03/10/2022 10:49:24 AM  Radiology CT Angio Chest PE W and/or Wo Contrast  Result Date: 03/10/2022 CLINICAL DATA:  Pulmonary embolus suspected EXAM: CT ANGIOGRAPHY CHEST WITH CONTRAST TECHNIQUE: Multidetector CT imaging of the chest was performed using the standard protocol during bolus administration of intravenous contrast. Multiplanar CT image  reconstructions and MIPs were obtained to evaluate the vascular anatomy. RADIATION DOSE REDUCTION: This exam was performed according to the departmental dose-optimization program which includes automated exposure control, adjustment of the mA and/or kV according to patient size and/or use of iterative reconstruction technique. CONTRAST:  17mL OMNIPAQUE IOHEXOL 350 MG/ML SOLN COMPARISON:  None Available. FINDINGS: Cardiovascular: Cardiac contours are upper limits of normal in size. Normal caliber thoracic aorta with no significant atherosclerotic disease. No visible coronary artery calcifications. Dilated main pulmonary artery measuring up to 3.9 cm. No evidence of central pulmonary embolus, suboptimal contrast opacification limits evaluation of the segmental and subsegmental pulmonary arteries. Mediastinum/Nodes: Esophagus and thyroid are unremarkable. Mildly enlarged bilateral hilar lymph nodes, likely reactive. Reference hilar lymph node measuring 1.1 cm in short axis on series  3, image 76. Lungs/Pleura: Central airways are patent. Bilateral bronchial wall thickening. Bilateral patchy ground-glass opacities. Solid pulmonary nodule of the lower lobe measuring 5 mm on series 6, image 77. Linear consolidations of the right middle lobe and lingula, likely due to atelectasis. No pleural effusion or pneumothorax. Upper Abdomen: Cholecystectomy clips.  No acute abnormality. Musculoskeletal: No chest wall abnormality. No acute or significant osseous findings. Review of the MIP images confirms the above findings. IMPRESSION: 1. No evidence of central pulmonary embolus, suboptimal contrast opacification limits evaluation of the segmental and subsegmental pulmonary arteries. 2. Bilateral bronchial wall thickening and patchy bilateral ground-glass opacities, findings are likely due to atypical infectious process including viral etiology. 3. Mildly enlarged bilateral hilar lymph nodes, likely reactive. 4. Small solid pulmonary  nodule of the right lower lobe measuring 5 mm. No follow-up needed if patient is low-risk.This recommendation follows the consensus statement: Guidelines for Management of Incidental Pulmonary Nodules Detected on CT Images: From the Fleischner Society 2017; Radiology 2017; 284:228-243. Electronically Signed   By: Yetta Glassman M.D.   On: 03/10/2022 13:53   DG Chest Port 1 View  Result Date: 03/10/2022 CLINICAL DATA:  Short of breath.  Flu like symptoms with fever EXAM: PORTABLE CHEST 1 VIEW COMPARISON:  Chest 02/16/2014 FINDINGS: Mild cardiac enlargement without heart failure. Lungs clear without infiltrate or effusion. No acute skeletal abnormality. IMPRESSION: No active disease. Electronically Signed   By: Franchot Gallo M.D.   On: 03/10/2022 12:09    Procedures Procedures    Medications Ordered in ED Medications  albuterol (VENTOLIN HFA) 108 (90 Base) MCG/ACT inhaler 2 puff (has no administration in time range)  albuterol (PROVENTIL,VENTOLIN) solution continuous neb (10 mg/hr Nebulization New Bag/Given 03/10/22 1103)  albuterol (PROVENTIL) (2.5 MG/3ML) 0.083% nebulizer solution (has no administration in time range)  albuterol (PROVENTIL) (2.5 MG/3ML) 0.083% nebulizer solution (5 mg Nebulization Given 03/10/22 1035)    ED Course/ Medical Decision Making/ A&P Clinical Course as of 03/10/22 1551  Tue Mar 10, 2022  1245 Pt re-evaluated and lungs sounds improved with treatment regimen. Pt able to speak in clear complete sentences. Pt with noted improvement of symptoms with treatment regimen in the ED. Discussed with patient lab and imaging findings. Answered all available questions. [SB]  1311 B Natriuretic Peptide(!): 281.2 [SB]  5409 Notified that patient O2 decreased to 86% while on the stretcher on 4L via [place]. Changed to high flow [place] on 6 L and O2 at 92%. [SB]  8119 Consult with hospitalist, Dr. Alfredia Ferguson who recommends admission and obtaining blood cultures, antibiotics for atypical  pneumonia, and respiratory panel.  [SB]    Clinical Course User Index [SB] Kharter Sestak A, PA-C                           Medical Decision Making Amount and/or Complexity of Data Reviewed Labs: ordered. Decision-making details documented in ED Course. Radiology: ordered.  Risk OTC drugs. Prescription drug management. Decision regarding hospitalization.   Patient presents to the ED complaining of symptoms last week.  Patient has associated shortness of breath, chest pain (due to persistent cough).  Tried conservative therapies at home.  Sick contacts at home who symptoms started after hers.  Initial vital signs, patient febrile at 100.4, tachypneic, hypoxic at 78% on room air.  Patient placed on 4 L via nasal cannula and O2 increased to 94%.  Patient does not wear oxygen at baseline.  On exam patient with nasal cannula  in place.  Patient with some difficulty speaking in complete sentences.  No pitting edema noted to bilateral lower extremities.  Expiratory wheezing noted diffusely throughout all lung fields.  No recent echo.  Differential diagnosis includes COPD, CHF, ACS, pneumothorax, pneumonia, COVID, flu.  Co morbidities that complicate the patient evaluation: Hypertension (patient not currently being treated at this time)  Labs:  I ordered, and personally interpreted labs.  The pertinent results include:   Initial troponin at 36, delta troponin at 26. COVID and flu swabs negative. BNP elevated to 81.2 CBC unremarkable. CMP with mild hypokalemia at 3.1, repleted in the ED, otherwise unremarkable. Initial lactic at 1.1  Imaging: I ordered imaging studies including chest x-ray, CTA chest I independently visualized and interpreted imaging which showed: Chest x-ray without acute cardiopulmonary findings.  CTA chest with  1. No evidence of central pulmonary embolus, suboptimal contrast  opacification limits evaluation of the segmental and subsegmental  pulmonary arteries.  2.  Bilateral bronchial wall thickening and patchy bilateral  ground-glass opacities, findings are likely due to atypical  infectious process including viral etiology.  3. Mildly enlarged bilateral hilar lymph nodes, likely reactive.  4. Small solid pulmonary nodule of the right lower lobe measuring 5  mm.   I agree with the radiologist interpretation  Medications:  I ordered medication including DuoNeb, Solu-Medrol, Tylenol, potassium, Zithromax, Rocephin for antibiotic and fever Reevaluation of the patient after these medicines and interventions, I reevaluated the patient and found that they have improved I have reviewed the patients home medicines and have made adjustments as needed   Consultations: I requested consultation with the Hospitalist, Dr. Marland Mcalpine and discussed lab and imaging findings as well as pertinent plan - they recommend: admission to the hospital.  Disposition: Presenting suspicious for hypoxia in the setting of likely atypical pneumonia.  Doubt COVID, flu, PE at this time.  Patient without a history of CHF however elevated BNP, feel the patient can benefit from further evaluation in the hospital.  After consideration of the diagnostic results and the patients response to treatment, I feel that the patient would benefit from Admission to the hospital.  Discussed with patient plans for admission to the hospital.  Answered all well questions.  Patient agreeable with admission at this time.      This chart was dictated using voice recognition software, Dragon. Despite the best efforts of this provider to proofread and correct errors, errors may still occur which can change documentation meaning.   Final Clinical Impression(s) / ED Diagnoses Final diagnoses:  Atypical pneumonia  Acute respiratory failure with hypoxia [facility])    Rx / DC Orders ED Discharge Orders     None         Safir Michalec A, PA-C 03/10/22 1558    Melene Plan, DO 03/11/22 [phone]

## 2022-03-10 NOTE — H&P (Signed)
History and Physical   [name]:[id number] DOB: 09-19-1970 DOA: 03/10/2022  PCP: Patient, No Pcp Per   Patient coming from: Home  Chief Complaint: Flulike symptoms  HPI: [name] is a 51 y.o. female with medical history significant of anemia, anxiety, panic attacks, cholecystitis status postcholecystectomy, endometritis, GERD, obesity, hypertension presenting with flulike symptoms.  Patient reports 1 week of flulike symptoms.  She does have sick contacts that include her children by other symptoms started after hers.  Her symptoms have included a productive cough with whitish sputum, chest pain secondary to the cough, fever, shortness of breath, congestion, general weakness.  She has had no significant improvement with over-the-counter medications.  No history of asthma nor COPD but she is a smoker and has smoked around 9 cigarettes a day for 7 years.  She denies chills, abdominal pain, constipation, diarrhea, nausea, vomiting.  ED Course: Vital signs in the ED significant for fever to 100.4, blood pressure in the 140s to 170 systolic, heart rate in the 80s to 100s, respiratory rate in the teens to 20s.  Requiring 4 to 7 L to maintain saturations.  Lab work-up included CMP with potassium 3.1, bicarb 34, glucose 123, protein 6.2.  CBC within normal limits.  BNP elevated to 81.  Lactic acid normal with repeat pending.  Troponin downtrending at 36 and then 26 on repeat.  Respiratory panel for flu and COVID-negative.  Full respiratory viral panel positive for parainfluenza virus 4.  Urinalysis with protein only.  Blood cultures pending.  Chest x-ray showed no acute abnormality.  CTA PE study was negative for PE but showed bilateral bronchial wall thickening and groundglass opacities suggestive of atypical pneumonia.  Also noted was lymphadenopathy likely reactive and a 5 mm pulmonary nodule with recommendation for no follow-up with low risk.  Patient received Tylenol, ceftriaxone,  azithromycin, Solu-Medrol, 40 mEq p.o. potassium, DuoNeb, albuterol in the ED.  Review of Systems: As per HPI otherwise all other systems reviewed and are negative.  Past Medical History:  Diagnosis Date   Acid reflux    Anemia    Anxiety    Asthma    no inhaler use   Chronic abdominal pain    Chronic back pain    Chronic diarrhea    Chronic nausea    Chronic pain syndrome    H/O endoscopy    checking for reflux   Heart murmur    developed during pregnancy- "leaky valve"   Hypertension    Panic attack    Pleurisy    SVD (spontaneous vaginal delivery)    x 3   TMJ disease    can open mouth wide but jaw pops    Past Surgical History:  Procedure Laterality Date   ANKLE SURGERY     APPENDECTOMY     CHOLECYSTECTOMY  10/12/2011   Procedure: LAPAROSCOPIC CHOLECYSTECTOMY WITH INTRAOPERATIVE CHOLANGIOGRAM;  Surgeon: Wilmon Arms. Corliss Skains, MD;  Location: [facility] OR;  Service: General;  Laterality: N/A;   DILATION AND CURETTAGE OF UTERUS     dilitation and curettage     EYE SURGERY     LAPAROSCOPIC APPENDECTOMY  10/05/2011   Procedure: APPENDECTOMY LAPAROSCOPIC;  Surgeon: Valarie Merino, MD;  Location: [facility] OR;  Service: General;  Laterality: N/A;   LASER ABLATION     WISDOM TOOTH EXTRACTION      Social History  reports that she has been smoking cigarettes. She has a 9.00 pack-year smoking history. She has never used smokeless tobacco. She reports  that she does not drink alcohol and does not use drugs.  Allergies  Allergen Reactions   Morphine And Related Hives and Shortness Of Breath    Severe chest pain, tolerates percocet/Dilaudid   Fentanyl    Flagyl [Metronidazole]    Gabapentin    Paroxetine Other (See Comments)    REACTION: delerium and panic reaction Pt states she is allergic to all antidepressants   Vicodin [Hydrocodone-Acetaminophen]     Family History  Problem Relation Age of Onset   Hypertension Other   Reviewed on admission  Prior to Admission medications    Medication Sig Start Date End Date Taking? Authorizing Provider  ibuprofen (ADVIL) 200 MG tablet Take 200 mg by mouth every 6 (six) hours as needed.   Yes [provider]  furosemide (LASIX) 40 MG tablet Take 1 tablet (40 mg total) by mouth daily. Patient not taking: Reported on 03/10/2022 02/16/14   Mirian Mo, MD  meloxicam (MOBIC) 15 MG tablet Take 1 tablet (15 mg total) by mouth daily. Patient not taking: Reported on 03/10/2022 02/05/14   Muthersbaugh, Dahlia Client, PA-C  methocarbamol (ROBAXIN) 500 MG tablet Take 1 tablet (500 mg total) by mouth 2 (two) times daily. Patient not taking: Reported on 03/10/2022 02/05/14   Muthersbaugh, Dahlia Client, PA-C  oxyCODONE-acetaminophen (PERCOCET/ROXICET) 5-325 MG per tablet Take 1-2 tablets by mouth every 4 (four) hours as needed for moderate pain or severe pain. Patient not taking: Reported on 03/10/2022 02/05/14   Muthersbaugh, Dahlia Client, PA-C  predniSONE (DELTASONE) 20 MG tablet 3 tabs po daily x 3 days, then 2 tabs x 3 days, then 1.5 tabs x 3 days, then 1 tab x 3 days, then 0.5 tabs x 3 days Patient not taking: Reported on 03/10/2022 02/05/14   Dierdre Forth, PA-C    Physical Exam: Vitals:   03/10/22 1900 03/10/22 1930 03/10/22 2000 03/10/22 2056  BP: (!) 146/88 (!) 140/86 (!) 144/89 (!) 151/90  Pulse: 87 83 86 85  Resp:  (!) 24 (!) 22 20  Temp:    98.4 F (36.9 C)  TempSrc:    Oral  SpO2: 92% 90% 91% 93%    Physical Exam Constitutional:      General: She is not in acute distress.    Appearance: Normal appearance. She is obese.  HENT:     Head: Normocephalic and atraumatic.     Mouth/Throat:     Mouth: Mucous membranes are moist.     Pharynx: Oropharynx is clear.  Eyes:     Extraocular Movements: Extraocular movements intact.     Pupils: Pupils are equal, round, and reactive to light.  Cardiovascular:     Rate and Rhythm: Normal rate and regular rhythm.     Pulses: Normal pulses.     Heart sounds: Normal heart sounds.   Pulmonary:     Effort: Pulmonary effort is normal. No respiratory distress.     Breath sounds: Wheezing present.  Abdominal:     General: Bowel sounds are normal. There is no distension.     Palpations: Abdomen is soft.     Tenderness: There is no abdominal tenderness.  Musculoskeletal:        General: No swelling or deformity.  Skin:    General: Skin is warm and dry.  Neurological:     General: No focal deficit present.     Mental Status: Mental status is at baseline.    Labs on Admission: I have personally reviewed following labs and imaging studies  CBC: Recent Labs  Lab 03/10/22 1042  WBC 7.3  NEUTROABS 5.2  HGB 14.0  HCT 43.8  MCV 92.0  PLT 255    Basic Metabolic Panel: Recent Labs  Lab 03/10/22 1042  NA 143  K 3.1*  CL 100  CO2 34*  GLUCOSE 123*  BUN 10  CREATININE 0.59  CALCIUM 9.2    GFR: CrCl cannot be calculated (Unknown ideal weight.).  Liver Function Tests: Recent Labs  Lab 03/10/22 1042  AST 28  ALT 31  ALKPHOS 121  BILITOT 0.6  PROT 6.2*  ALBUMIN 3.8    Urine analysis:    Component Value Date/Time   COLORURINE YELLOW 03/10/2022 1700   APPEARANCEUR CLEAR 03/10/2022 1700   LABSPEC >1.046 (H) 03/10/2022 1700   PHURINE 6.5 03/10/2022 1700   GLUCOSEU NEGATIVE 03/10/2022 1700   HGBUR NEGATIVE 03/10/2022 1700   HGBUR negative 11/03/2006 1545   BILIRUBINUR NEGATIVE 03/10/2022 1700   KETONESUR NEGATIVE 03/10/2022 1700   PROTEINUR TRACE (A) 03/10/2022 1700   UROBILINOGEN 0.2 02/16/2014 2200   NITRITE NEGATIVE 03/10/2022 1700   LEUKOCYTESUR NEGATIVE 03/10/2022 1700    Radiological Exams on Admission: CT Angio Chest PE W and/or Wo Contrast  Result Date: 03/10/2022 CLINICAL DATA:  Pulmonary embolus suspected EXAM: CT ANGIOGRAPHY CHEST WITH CONTRAST TECHNIQUE: Multidetector CT imaging of the chest was performed using the standard protocol during bolus administration of intravenous contrast. Multiplanar CT image reconstructions and  MIPs were obtained to evaluate the vascular anatomy. RADIATION DOSE REDUCTION: This exam was performed according to the departmental dose-optimization program which includes automated exposure control, adjustment of the mA and/or kV according to patient size and/or use of iterative reconstruction technique. CONTRAST:  24mL OMNIPAQUE IOHEXOL 350 MG/ML SOLN COMPARISON:  None Available. FINDINGS: Cardiovascular: Cardiac contours are upper limits of normal in size. Normal caliber thoracic aorta with no significant atherosclerotic disease. No visible coronary artery calcifications. Dilated main pulmonary artery measuring up to 3.9 cm. No evidence of central pulmonary embolus, suboptimal contrast opacification limits evaluation of the segmental and subsegmental pulmonary arteries. Mediastinum/Nodes: Esophagus and thyroid are unremarkable. Mildly enlarged bilateral hilar lymph nodes, likely reactive. Reference hilar lymph node measuring 1.1 cm in short axis on series 3, image 76. Lungs/Pleura: Central airways are patent. Bilateral bronchial wall thickening. Bilateral patchy ground-glass opacities. Solid pulmonary nodule of the lower lobe measuring 5 mm on series 6, image 77. Linear consolidations of the right middle lobe and lingula, likely due to atelectasis. No pleural effusion or pneumothorax. Upper Abdomen: Cholecystectomy clips.  No acute abnormality. Musculoskeletal: No chest wall abnormality. No acute or significant osseous findings. Review of the MIP images confirms the above findings. IMPRESSION: 1. No evidence of central pulmonary embolus, suboptimal contrast opacification limits evaluation of the segmental and subsegmental pulmonary arteries. 2. Bilateral bronchial wall thickening and patchy bilateral ground-glass opacities, findings are likely due to atypical infectious process including viral etiology. 3. Mildly enlarged bilateral hilar lymph nodes, likely reactive. 4. Small solid pulmonary nodule of the right  lower lobe measuring 5 mm. No follow-up needed if patient is low-risk.This recommendation follows the consensus statement: Guidelines for Management of Incidental Pulmonary Nodules Detected on CT Images: From the Fleischner Society 2017; Radiology 2017; 284:228-243. Electronically Signed   By: Allegra Lai M.D.   On: 03/10/2022 13:53   DG Chest Port 1 View  Result Date: 03/10/2022 CLINICAL DATA:  Short of breath.  Flu like symptoms with fever EXAM: PORTABLE CHEST 1 VIEW COMPARISON:  Chest 02/16/2014 FINDINGS: Mild cardiac enlargement without heart failure.  Lungs clear without infiltrate or effusion. No acute skeletal abnormality. IMPRESSION: No active disease. Electronically Signed   By: Franchot Gallo M.D.   On: 03/10/2022 12:09    EKG: Independently reviewed.  Normal sinus rhythm at 67 bpm.  Nonspecific T wave changes.  Assessment/Plan Principal Problem:   Atypical pneumonia Active Problems:   Morbid obesity ([place])   Acute respiratory failure with hypoxia ([place])   Bold that > Patient presenting with flulike symptoms and noted to be hypoxic in the ED requiring increasing amounts of supplemental oxygen ultimately 7 L. > Imaging showed bilateral bronchial wall thickening and groundglass opacities suggestive of atypical pneumonia. > Work-up included respiratory panel for flu and COVID which was negative.  Patient was positive for parainfluenza virus 4 on full RVP. > Given lack of leukocytosis this is most likely viral pneumonia secondary to parainfluenza virus 4. > Did receive antibiotics during the course of work-up in the ED.  At this time do not suspect superimposed bacterial infection.  We will check procalcitonin to further rule out bacterial superinfection. - Continue to monitor on progressive unit - Hold off on further antibiotics - Continue with scheduled DuoNebs and as needed albuterol - Daily prednisone - Follow-up procalcitonin - Supplemental oxygen as needed, wean as  tolerated - Check VBG given elevated bicarb - Trend fever curve and WBC - Supportive care  Obesity - Noted  History of anemia, anxiety, panic attacks, GERD, hypertension - Not currently on any medication for these  DVT prophylaxis: Lovenox Code Status:   Full Family Communication:  None on admission Disposition Plan:   Patient is from:  Home  Anticipated DC to:  Home  Anticipated DC date:  2 to 4 days  Anticipated DC barriers: None  Consults called:  None Admission status:  Inpatient, progressive  Severity of Illness: The appropriate patient status for this patient is INPATIENT. Inpatient status is judged to be reasonable and necessary in order to provide the required intensity of service to ensure the patient's safety. The patient's presenting symptoms, physical exam findings, and initial radiographic and laboratory data in the context of their chronic comorbidities is felt to place them at high risk for further clinical deterioration. Furthermore, it is not anticipated that the patient will be medically stable for discharge from the hospital within 2 midnights of admission.   * I certify that at the point of admission it is my clinical judgment that the patient will require inpatient hospital care spanning beyond 2 midnights from the point of admission due to high intensity of service, high risk for further deterioration and high frequency of surveillance required.Marcelyn Bruins MD [facility]  How to contact the [facility] Attending or Consulting provider [place] or covering provider during after hours [place], for this patient?   Check the care team in [facility] and look for a) attending/consulting [facility] provider listed and b) the [facility] team listed Log into [email or web address] and use [place]'s universal password to access. If you do not have the password, please contact the hospital operator. Locate the [facility] provider you are looking for under [facility] and page to a number  that you can be directly reached. If you still have difficulty reaching the provider, please page the [facility] (Director on Call) for the Hospitalists listed on amion for assistance.  03/10/2022, 10:39 PM

## 2022-03-10 NOTE — Progress Notes (Signed)
Critical Lab..  Notified by lab at 2205 Lab: PO2- 31 Provider paged: Olena Heckle Time Page: [phone] No new orders recieved

## 2022-03-11 ENCOUNTER — Other Ambulatory Visit: Payer: Self-pay

## 2022-03-11 DIAGNOSIS — B348 Other viral infections of unspecified site: Secondary | ICD-10-CM

## 2022-03-11 DIAGNOSIS — J189 Pneumonia, unspecified organism: Secondary | ICD-10-CM | POA: Diagnosis not present

## 2022-03-11 HISTORY — DX: Other viral infections of unspecified site: B34.8

## 2022-03-11 HISTORY — DX: Pneumonia, unspecified organism: J18.9

## 2022-03-11 LAB — CBC
HCT: 41.3 % (ref 36.0–46.0)
Hemoglobin: 13.1 g/dL (ref 12.0–15.0)
MCH: 29.5 pg (ref 26.0–34.0)
MCHC: 31.7 g/dL (ref 30.0–36.0)
MCV: 93 fL (ref 80.0–100.0)
Platelets: 244 10*3/uL (ref 150–400)
RBC: 4.44 MIL/uL (ref 3.87–5.11)
RDW: 12.6 % (ref 11.5–15.5)
WBC: 10.1 10*3/uL (ref 4.0–10.5)
nRBC: 0 % (ref 0.0–0.2)

## 2022-03-11 LAB — COMPREHENSIVE METABOLIC PANEL
ALT: 29 U/L (ref 0–44)
AST: 18 U/L (ref 15–41)
Albumin: 3.1 g/dL — ABNORMAL LOW (ref 3.5–5.0)
Alkaline Phosphatase: 107 U/L (ref 38–126)
Anion gap: 7 (ref 5–15)
BUN: 13 mg/dL (ref 6–20)
CO2: 33 mmol/L — ABNORMAL HIGH (ref 22–32)
Calcium: 8.4 mg/dL — ABNORMAL LOW (ref 8.9–10.3)
Chloride: 101 mmol/L (ref 98–111)
Creatinine, Ser: 0.68 mg/dL (ref 0.44–1.00)
GFR, Estimated: >60 mL/min (ref >60)
Glucose, Bld: 127 mg/dL — ABNORMAL HIGH (ref 70–99)
Potassium: 3.1 mmol/L — ABNORMAL LOW (ref 3.5–5.1)
Sodium: 141 mmol/L (ref 135–145)
Total Bilirubin: 0.7 mg/dL (ref 0.3–1.2)
Total Protein: 6.1 g/dL — ABNORMAL LOW (ref 6.5–8.1)

## 2022-03-11 LAB — MAGNESIUM: Magnesium: 2.2 mg/dL (ref 1.7–2.4)

## 2022-03-11 LAB — PROCALCITONIN
Procalcitonin: <0.1 ng/mL
Procalcitonin: <0.1 ng/mL

## 2022-03-11 LAB — HIV ANTIBODY (ROUTINE TESTING W REFLEX): HIV Screen 4th Generation wRfx: NONREACTIVE

## 2022-03-11 MED ORDER — PNEUMOCOCCAL 20-VAL CONJ VACC 0.5 ML IM SUSY
0.5000 mL | PREFILLED_SYRINGE | INTRAMUSCULAR | Status: AC
  Administered 2022-03-14: 0.5 mL via INTRAMUSCULAR
  Filled 2022-03-11: qty 0.5

## 2022-03-11 MED ORDER — SODIUM CHLORIDE 0.9 % IV SOLN
500.0000 mg | INTRAVENOUS | Status: DC
  Administered 2022-03-11: 500 mg via INTRAVENOUS
  Filled 2022-03-11: qty 5

## 2022-03-11 MED ORDER — GUAIFENESIN-DM 100-10 MG/5ML PO SYRP
5.0000 mL | ORAL_SOLUTION | ORAL | Status: DC | PRN
  Administered 2022-03-11 – 2022-03-14 (×15): 5 mL via ORAL
  Filled 2022-03-11 (×15): qty 10

## 2022-03-11 MED ORDER — ORAL CARE MOUTH RINSE: 15.0000 mL | OROMUCOSAL | Status: DC | PRN

## 2022-03-11 MED ORDER — SODIUM CHLORIDE 0.9 % IV SOLN
1.0000 g | INTRAVENOUS | Status: DC
  Administered 2022-03-11 – 2022-03-14 (×4): 1 g via INTRAVENOUS
  Filled 2022-03-11 (×4): qty 10

## 2022-03-11 MED ORDER — INFLUENZA VAC SPLIT QUAD 0.5 ML IM SUSY
0.5000 mL | PREFILLED_SYRINGE | INTRAMUSCULAR | Status: AC
  Administered 2022-03-14: 0.5 mL via INTRAMUSCULAR
  Filled 2022-03-11: qty 0.5

## 2022-03-11 MED ORDER — METHYLPREDNISOLONE SODIUM SUCC 40 MG IJ SOLR
40.0000 mg | Freq: Two times a day (BID) | INTRAMUSCULAR | Status: DC
  Administered 2022-03-11 – 2022-03-14 (×7): 40 mg via INTRAVENOUS
  Filled 2022-03-11 (×7): qty 1

## 2022-03-11 MED ORDER — POTASSIUM CHLORIDE CRYS ER 20 MEQ PO TBCR
40.0000 meq | EXTENDED_RELEASE_TABLET | ORAL | Status: AC
  Administered 2022-03-11 (×2): 40 meq via ORAL
  Filled 2022-03-11 (×2): qty 2

## 2022-03-11 NOTE — Plan of Care (Signed)

## 2022-03-11 NOTE — TOC Initial Note (Signed)
Transition of Care [facility]) - Initial/Assessment Note    Patient Details  Name: [name] MRN: [id number] Date of Birth: 05/24/1970  Transition of [facility]) CM/SW Contact:    Golda Acre, RN Phone Number: 03/11/2022, 8:22 AM  Clinical Narrative:                  Transition of Care [facility]) Screening Note   Patient Details  Name: [name] Date of Birth: 1971/03/15   Transition of Care [facility]) CM/SW Contact:    Golda Acre, RN Phone Number: 03/11/2022, 8:22 AM    Transition of Care Department [facility]) has reviewed patient and no TOC needs have been identified at this time. We will continue to monitor patient advancement through interdisciplinary progression rounds. If new patient transition needs arise, please place a TOC consult.    Expected Discharge Plan: Home/Self Care Barriers to Discharge: Continued Medical Work up   Patient Goals and CMS Choice Patient states their goals for this hospitalization and ongoing recovery are:: to go home CMS Medicare.gov Compare Post Acute Care list provided to:: Patient    Expected Discharge Plan and Services Expected Discharge Plan: Home/Self Care   Discharge Planning Services: CM Consult   Living arrangements for the past 2 months: Single Family Home                                      Prior Living Arrangements/Services Living arrangements for the past 2 months: Single Family Home Lives with:: Self Patient language and need for interpreter reviewed:: Yes Do you feel safe going back to the place where you live?: Yes            Criminal Activity/Legal Involvement Pertinent to Current Situation/Hospitalization: No - Comment as needed  Activities of Daily Living Home Assistive Devices/Equipment: None ADL Screening (condition at time of admission) Patient's cognitive ability adequate to safely complete daily activities?: Yes Is the patient deaf or have difficulty hearing?: No Does the patient have  difficulty seeing, even when wearing glasses/contacts?: No Does the patient have difficulty concentrating, remembering, or making decisions?: No Patient able to express need for assistance with ADLs?: No Does the patient have difficulty dressing or bathing?: No Independently performs ADLs?: Yes (appropriate for developmental age) Does the patient have difficulty walking or climbing stairs?: No Weakness of Legs: None Weakness of Arms/Hands: None  Permission Sought/Granted                  Emotional Assessment Appearance:: Appears stated age Attitude/Demeanor/Rapport: Engaged Affect (typically observed): Calm Orientation: : Oriented to Self, Oriented to Place, Oriented to  Time, Oriented to Situation Alcohol / Substance Use: Tobacco Use (current tobacco use smokes 9 cigarettes a day) Psych Involvement: No (comment)  Admission diagnosis:  Atypical pneumonia [J18.9] Acute respiratory failure with hypoxia (HCC) [J96.01] Patient Active Problem List   Diagnosis Date Noted   Atypical pneumonia 03/10/2022   Acute respiratory failure with hypoxia (HCC) 03/10/2022   Bloody diarrhea 12/30/2011   Nausea and vomiting 12/30/2011   Menorrhagia 11/06/2011   Endometritis 11/06/2011   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 07/15/2006   ANXIETY 07/15/2006   PANIC ATTACKS 07/15/2006   OBSESSIVE COMPUL. DISORDER 07/15/2006   HYPERTENSION, BENIGN SYSTEMIC 07/15/2006   GASTROESOPHAGEAL REFLUX, NO ESOPHAGITIS 07/15/2006  PCP:  Patient, No Pcp Per Pharmacy:   [id number] - [place], [place] [address] [place] [address] Phone: [phone] Fax: [place], [place] [address] [place]  [address] Phone: [phone] Fax: [place] [address] Phone: [phone] Fax: [phone]     Social Determinants of Health (SDOH) Interventions    Readmission Risk Interventions   No data to display

## 2022-03-11 NOTE — Progress Notes (Signed)
Pts BP 170/97. Pt lying in bed resting. No scheduled or PRN meds ordered. Provider notified.

## 2022-03-11 NOTE — Progress Notes (Addendum)
[facility] Progress Note  Patient: [name]     LOV:[id number]  DOA: 03/10/2022   PCP: Patient, No Pcp Per       Brief hospital course: This is a 51 year old female with hypertension and nicotine abuse who presents to the hospital for shortness of breath. CT scan of the chest revealed patchy bilateral groundglass opacities and mildly enlarged bilateral hilar lymph nodes. Also noted was a small solid pulmonary nodule in the right lower lobe measuring 5 mm. Temperature noted to be 100.4, respiratory rate 20s to 30s.  Subjective:  She continues to have some shortness of breath and cough.  Assessment and Plan: Principal Problem:   CAP (community acquired pneumonia)   Parainfluenza infection -She does admit to having a few episodes of vomiting at home early on when her respiratory symptoms started -difficult to tell if she has aspiration pneumonia versus bacterial pneumonia in the setting of a viral infection -Continue ceftriaxone and azithromycin along with Solu-Medrol  Active Problems:    Acute respiratory failure with hypoxia ([place]) While in the ED, she was noted to have a pulse ox of 86% while on 4 L nasal cannula. -Per RN today, pulse ox was 86% on 4 L-she is currently requiring 5 to 6 L  Nicotine abuse - She smokes "9 cigarettes/day" - Have counseled her regarding quitting    Morbid obesity ([place]) Body mass index is 42.26 kg/m.      Code Status: Full Code DVT prophylaxis:  enoxaparin (LOVENOX) injection 40 mg Start: 03/10/22 2200    Consultants: None Level of Care: Level of care: Progressive  Objective:   Vitals:   03/11/22 0815 03/11/22 1200 03/11/22 1334 03/11/22 1347  BP:   (!) 160/94   Pulse:   86   Resp:   20   Temp:   98.4 F (36.9 C)   TempSrc:   Oral   SpO2: 96% 91% 94%   Weight:    111.7 kg  Height:    5\' 4"  (1.626 m)   Filed Weights   03/11/22 1347  Weight: 111.7 kg   Exam: General exam: Appears comfortable  HEENT: oral mucosa  moist Respiratory system: Wheezing bilaterally with cough Cardiovascular system: S1 & S2 heard  Gastrointestinal system: Abdomen soft, non-tender, nondistended. Normal bowel sounds   Extremities: No cyanosis, clubbing or edema Psychiatry:  Mood & affect appropriate.    Imaging and lab data was personally reviewed    CBC: Recent Labs  Lab 03/10/22 1042 03/11/22 0439  WBC 7.3 10.1  NEUTROABS 5.2  --   HGB 14.0 13.1  HCT 43.8 41.3  MCV 92.0 93.0  PLT 255 884   Basic Metabolic Panel: Recent Labs  Lab 03/10/22 1042 03/11/22 0439  NA 143 141  K 3.1* 3.1*  CL 100 101  CO2 34* 33*  GLUCOSE 123* 127*  BUN 10 13  CREATININE 0.59 0.68  CALCIUM 9.2 8.4*  MG  --  2.2   GFR: Estimated Creatinine Clearance: 102.9 mL/min (by C-G formula based on SCr of 0.68 mg/dL).  Scheduled Meds:  enoxaparin (LOVENOX) injection  40 mg Subcutaneous Q24H   [START ON 03/12/2022] influenza vac split quadrivalent PF  0.5 mL Intramuscular Tomorrow-1000   ipratropium-albuterol  3 mL Nebulization Q4H   methylPREDNISolone (SOLU-MEDROL) injection  40 mg Intravenous Q12H   [START ON 03/12/2022] pneumococcal 20-valent conjugate vaccine  0.5 mL Intramuscular Tomorrow-1000   sodium chloride flush  3 mL Intravenous Q12H   Continuous Infusions:  sodium chloride Stopped (  03/10/22 1725)   azithromycin 500 mg (03/11/22 1636)   cefTRIAXone (ROCEPHIN)  IV 1 g (03/11/22 1543)     LOS: 1 day   Author: Debbe Odea  03/11/2022 5:44 PM

## 2022-03-12 DIAGNOSIS — J189 Pneumonia, unspecified organism: Secondary | ICD-10-CM | POA: Diagnosis not present

## 2022-03-12 LAB — BASIC METABOLIC PANEL
Anion gap: 7 (ref 5–15)
BUN: 15 mg/dL (ref 6–20)
CO2: 30 mmol/L (ref 22–32)
Calcium: 8.8 mg/dL — ABNORMAL LOW (ref 8.9–10.3)
Chloride: 106 mmol/L (ref 98–111)
Creatinine, Ser: 0.72 mg/dL (ref 0.44–1.00)
GFR, Estimated: >60 mL/min (ref >60)
Glucose, Bld: 147 mg/dL — ABNORMAL HIGH (ref 70–99)
Potassium: 3.7 mmol/L (ref 3.5–5.1)
Sodium: 143 mmol/L (ref 135–145)

## 2022-03-12 LAB — PROCALCITONIN: Procalcitonin: <0.1 ng/mL

## 2022-03-12 MED ORDER — AZITHROMYCIN 250 MG PO TABS
500.0000 mg | ORAL_TABLET | Freq: Every day | ORAL | Status: DC
  Administered 2022-03-12 – 2022-03-13 (×2): 500 mg via ORAL
  Filled 2022-03-12 (×2): qty 2

## 2022-03-12 MED ORDER — IBUPROFEN 200 MG PO TABS
400.0000 mg | ORAL_TABLET | ORAL | Status: DC | PRN
  Administered 2022-03-12 – 2022-03-14 (×6): 400 mg via ORAL
  Filled 2022-03-12 (×6): qty 2

## 2022-03-12 MED ORDER — HYDRALAZINE HCL 20 MG/ML IJ SOLN
10.0000 mg | Freq: Once | INTRAMUSCULAR | Status: AC
  Administered 2022-03-12: 10 mg via INTRAVENOUS
  Filled 2022-03-12: qty 1

## 2022-03-12 NOTE — Plan of Care (Signed)
  Problem: Clinical Measurements: Goal: Ability to maintain clinical measurements within normal limits will improve Outcome: Progressing Goal: Diagnostic test results will improve Outcome: Progressing Goal: Respiratory complications will improve Outcome: Progressing   

## 2022-03-12 NOTE — Progress Notes (Signed)
Pt BP elevated 181/105 HR 75. Provider notified. New orders for hydralazine received and administered, which was effective. BP now 146/80.

## 2022-03-12 NOTE — Progress Notes (Signed)
SATURATION QUALIFICATIONS: (This note is used to comply with regulatory documentation for home oxygen)  Patient Saturations on Room Air at Rest = 84%  Patient Saturations on Room Air while Ambulating = N/A%  Patient Saturations on 6 Liters of oxygen while Ambulating = 92%  Please briefly explain why patient needs home oxygen: Pt saturation on 4L at rest was 88, RN had to increase  it to 6L to come up to 93% at rest. Will continue to monitor pt

## 2022-03-12 NOTE — Progress Notes (Signed)
[facility] Progress Note  Patient: [name]     GUY:[id number]  DOA: 03/10/2022   PCP: Patient, No Pcp Per       Brief hospital course: This is a 51 year old female with hypertension and nicotine abuse who presents to the hospital for shortness of breath. CT scan of the chest revealed patchy bilateral groundglass opacities and mildly enlarged bilateral hilar lymph nodes. Also noted was a small solid pulmonary nodule in the right lower lobe measuring 5 mm. Temperature noted to be 100.4, respiratory rate 20s to 30s.  Subjective:  She has ongoing cough which is now making her chest hurt. She feels short of breath with a short amount of ambulation and needs to rest afterwards.   Assessment and Plan: Principal Problem:   CAP (community acquired pneumonia)   Parainfluenza infection -She does admit to having a few episodes of vomiting at home early on when her respiratory symptoms started -difficult to tell if she has aspiration pneumonia versus bacterial pneumonia in the setting of a viral infection - she continues to have wheezing and dyspnea today -Continue ceftriaxone and azithromycin along with Solu-Medrol - continue Duonebs - Add Motrin for chest pain and add incentive spirometry  Active Problems:    Acute respiratory failure with hypoxia ([place]) -while in the ED, she was noted to have a pulse ox of 86% while on 4 L nasal cannula. -  pulse ox dropping to 87% on 3 L today  Nicotine abuse - She smokes "9 cigarettes/day" - Have counseled her regarding quitting    Morbid obesity ([place]) Body mass index is 42.26 kg/m.      Code Status: Full Code DVT prophylaxis:  enoxaparin (LOVENOX) injection 40 mg Start: 03/10/22 2200    Consultants: None Level of Care: Level of care: Progressive  Objective:   Vitals:   03/12/22 0743 03/12/22 0745 03/12/22 1152 03/12/22 1311  BP:    (!) 166/106  Pulse:    84  Resp:      Temp:    98.5 F (36.9 C)  TempSrc:    Oral   SpO2: (!) 89% (!) 89% 90% 91%  Weight:      Height:       Filed Weights   03/11/22 1347  Weight: 111.7 kg   Exam: General exam: Appears comfortable  HEENT: oral mucosa moist Respiratory system: ongoing b/l wheezing, cough and tachypnea Cardiovascular system: S1 & S2 heard  Gastrointestinal system: Abdomen soft, non-tender, nondistended. Normal bowel sounds   Extremities: No cyanosis, clubbing or edema Psychiatry:  Mood & affect appropriate.    Imaging and lab data was personally reviewed    CBC: Recent Labs  Lab 03/10/22 1042 03/11/22 0439  WBC 7.3 10.1  NEUTROABS 5.2  --   HGB 14.0 13.1  HCT 43.8 41.3  MCV 92.0 93.0  PLT 255 563    Basic Metabolic Panel: Recent Labs  Lab 03/10/22 1042 03/11/22 0439 03/12/22 0430  NA 143 141 143  K 3.1* 3.1* 3.7  CL 100 101 106  CO2 34* 33* 30  GLUCOSE 123* 127* 147*  BUN 10 13 15   CREATININE 0.59 0.68 0.72  CALCIUM 9.2 8.4* 8.8*  MG  --  2.2  --     GFR: Estimated Creatinine Clearance: 102.9 mL/min (by C-G formula based on SCr of 0.72 mg/dL).  Scheduled Meds:  azithromycin  500 mg Oral q1800   enoxaparin (LOVENOX) injection  40 mg Subcutaneous Q24H   influenza vac split quadrivalent PF  0.5 mL Intramuscular Tomorrow-1000   ipratropium-albuterol  3 mL Nebulization Q4H   methylPREDNISolone (SOLU-MEDROL) injection  40 mg Intravenous Q12H   pneumococcal 20-valent conjugate vaccine  0.5 mL Intramuscular Tomorrow-1000   sodium chloride flush  3 mL Intravenous Q12H   Continuous Infusions:  sodium chloride Stopped (03/10/22 1725)   cefTRIAXone (ROCEPHIN)  IV 1 g (03/11/22 1543)     LOS: 2 days   Author: Debbe Odea  03/12/2022 2:49 PM

## 2022-03-12 NOTE — Progress Notes (Signed)
PHARMACIST - PHYSICIAN COMMUNICATION  CONCERNING: Antibiotic IV to Oral Route Change Policy  RECOMMENDATION: This patient is receiving azithromycin by the intravenous route.  Based on criteria approved by the Pharmacy and Therapeutics Committee, the antibiotic(s) is/are being converted to the equivalent oral dose form(s).   DESCRIPTION: These criteria include:  Patient being treated for a respiratory tract infection, urinary tract infection, cellulitis or clostridium difficile associated diarrhea if on metronidazole  The patient is not neutropenic and does not exhibit a GI malabsorption state  The patient is eating (either orally or via tube) and/or has been taking other orally administered medications for a least 24 hours  The patient is improving clinically and has a Tmax < 100.5  If you have questions about this conversion, please contact the Pharmacy Department  []  ( [phone] )  [place] []  ( [phone] )  [facility] []  ( [phone]  [place] []  ( [phone] )  [facility] [x]  ( [phone] )  [facility]  

## 2022-03-12 NOTE — Progress Notes (Signed)
Mobility Specialist - Progress Note   03/12/22 1502  Mobility  Activity Ambulated with assistance in hallway  Level of Assistance Standby assist, set-up cues, supervision of patient - no hands on  Assistive Device Other (Comment) (Hallway Rails)  Distance Ambulated (ft) 500 ft  Range of Motion/Exercises Active  Activity Response Tolerated well  Mobility Referral Yes  $Mobility charge 1 Mobility   Pt was found in bed and agreeable to ambulate. Pt had x2 standing rest breaks due to feeling SOB. At EOS returned to bed with all necessities in reach.   Ferd Hibbs Mobility Specialist

## 2022-03-12 NOTE — Progress Notes (Signed)
Mobility Specialist - Progress Note  Pre-mobility: 91% SpO2 During mobility: 88% SpO2 Post-mobility: 92% SPO2   03/12/22 1100  Oxygen Therapy  O2 Device Nasal Cannula  O2 Flow Rate (L/min) 6 L/min  Mobility  Activity Ambulated with assistance in hallway  Level of Assistance Standby assist, set-up cues, supervision of patient - no hands on  Assistive Device Other (Comment) (Hallway Rails)  Distance Ambulated (ft) 450 ft  Range of Motion/Exercises Active  Activity Response Tolerated well  Mobility Referral Yes  $Mobility charge 1 Mobility   Pt was found in bed and agreeable to ambulate. Pt ambulated to bathroom before going to hallway. During ambulation took 1 standing rest break due to feeling SOB. At EOS returned to bed with all necessities in reach and RN notified of session.  Ferd Hibbs Mobility Specialist

## 2022-03-13 DIAGNOSIS — B348 Other viral infections of unspecified site: Secondary | ICD-10-CM

## 2022-03-13 DIAGNOSIS — J189 Pneumonia, unspecified organism: Principal | ICD-10-CM

## 2022-03-13 DIAGNOSIS — J9601 Acute respiratory failure with hypoxia: Secondary | ICD-10-CM

## 2022-03-13 MED ORDER — SALINE SPRAY 0.65 % NA SOLN: 1.0000 | NASAL | Status: DC | PRN

## 2022-03-13 MED ORDER — TRAMADOL HCL 50 MG PO TABS
50.0000 mg | ORAL_TABLET | Freq: Once | ORAL | Status: AC
  Administered 2022-03-13: 50 mg via ORAL
  Filled 2022-03-13: qty 1

## 2022-03-13 NOTE — TOC Progression Note (Signed)
Transition of Care [facility]) - Progression Note    Patient Details  Name: [name] MRN: [id number] Date of Birth: 09/18/1970  [facility]) CM/SW Contact  Purcell Mouton, RN Phone Number: 03/13/2022, 4:05 PM  Clinical Narrative:    Spoke with pt concerning home O2. [employer] was selection, referral given to [facility] in house rep. Pt states that she has [place] Medicaid.    Expected Discharge Plan: Home/Self Care Barriers to Discharge: Continued Medical Work up  Expected Discharge Plan and Services Expected Discharge Plan: Home/Self Care   Discharge Planning Services: CM Consult   Living arrangements for the past 2 months: Single Family Home                                       Social Determinants of Health (SDOH) Interventions    Readmission Risk Interventions     No data to display

## 2022-03-13 NOTE — Progress Notes (Signed)
Mobility Specialist - Progress Note    03/13/22 1455  Mobility  Activity Ambulated with assistance in hallway  Level of Assistance Contact guard assist, steadying assist  Assistive Device Other (Comment) (Hallway Rails)  Distance Ambulated (ft) 200 ft  Range of Motion/Exercises Active  Activity Response Tolerated well  Mobility Referral Yes  $Mobility charge 1 Mobility   Pt was found in bed and agreeable to ambulate. Ambulated to bathroom before going into the hallway. Had x1 standing rest break during ambulation and at EOS returned to sit EOB with all necessities in reach. RN was notified of session.  Ferd Hibbs Mobility Specialist

## 2022-03-13 NOTE — Progress Notes (Signed)
Pt found on 2lpm sats 87%, pt titrated to 3lpm

## 2022-03-13 NOTE — Progress Notes (Signed)
SATURATION QUALIFICATIONS: (This note is used to comply with regulatory documentation for home oxygen)  Patient Saturations on Room Air at Rest =84%  Patient Saturations on Room Air while Ambulating = N/A%  Patient Saturations on 3 Liters of oxygen while Ambulating = 90%  Please briefly explain why patient needs home oxygen: Pt below 87% on RA at rest, 93% at rest with 3L oxygen.

## 2022-03-13 NOTE — Progress Notes (Signed)
Mobility Specialist - Progress Note   03/13/22 0915  Oxygen Therapy  O2 Device Nasal Cannula  O2 Flow Rate (L/min) 2 L/min  Mobility  Activity Ambulated independently in hallway  Level of Assistance Contact guard assist, steadying assist  Assistive Device None  Distance Ambulated (ft) 150 ft  Range of Motion/Exercises Active  Activity Response Tolerated well  Mobility Referral Yes  $Mobility charge 1 Mobility   Pt was found in bed and agreeable to ambulate. Pt took x1 standing rest break due to feeling SOB. Was able to return to sit EOB with all necessities in reach and respiratory in room.  Ferd Hibbs Mobility Specialist

## 2022-03-13 NOTE — Progress Notes (Signed)
[facility] Progress Note  Patient: [name]     [name]:[id number]  DOA: 03/10/2022   PCP: Patient, No Pcp Per       Brief hospital course: This is a 51 year old female with hypertension and nicotine abuse who presents to the hospital for shortness of breath. CT scan of the chest revealed patchy bilateral groundglass opacities and mildly enlarged bilateral hilar lymph nodes. Also noted was a small solid pulmonary nodule in the right lower lobe measuring 5 mm. Temperature noted to be 100.4, respiratory rate 20s to 30s.  Subjective:  She continues to have shortness of breath with mild to moderate ambulation.  She also continues to have a cough.  Assessment and Plan: Principal Problem:   CAP (community acquired pneumonia)   Parainfluenza infection -She does admit to having a few episodes of vomiting at home early on when her respiratory symptoms started -difficult to tell if she has aspiration pneumonia versus bacterial pneumonia in the setting of a viral infection  -Continue ceftriaxone and azithromycin along with Solu-Medrol- she is still wheezing and short of breath - continue Duonebs - Add Motrin for chest pain and add incentive spirometry  Active Problems:    Acute respiratory failure with hypoxia ([name]) -while in the ED, she was noted to have a pulse ox of 86% while on 4 L nasal cannula. -  pulse ox dropping to 84% on room air today- requiring 3 L to stay at 90%  Nicotine abuse - She smokes "9 cigarettes/day" - Have counseled her regarding quitting    Morbid obesity ([name]) Body mass index is 42.26 kg/m.      Code Status: Full Code DVT prophylaxis:  enoxaparin (LOVENOX) injection 40 mg Start: 03/10/22 2200    Consultants: None Level of Care: Level of care: Progressive  Objective:   Vitals:   03/13/22 0919 03/13/22 1132 03/13/22 1344 03/13/22 1535  BP:   (!) 157/100   Pulse:   83   Resp:   (!) 22   Temp:   98.3 F (36.8 C)   TempSrc:   Oral   SpO2:  92% 93% (!) 89% 90%  Weight:      Height:       Filed Weights   03/11/22 1347  Weight: 111.7 kg   Exam: General exam: Appears comfortable  HEENT: oral mucosa moist Respiratory system: extensive b/l wheezing an rhonchi still present Cardiovascular system: S1 & S2 heard  Gastrointestinal system: Abdomen soft, non-tender, nondistended. Normal bowel sounds   Extremities: No cyanosis, clubbing or edema Psychiatry:  Mood & affect appropriate.    Imaging and lab data was personally reviewed    CBC: Recent Labs  Lab 03/10/22 1042 03/11/22 0439  WBC 7.3 10.1  NEUTROABS 5.2  --   HGB 14.0 13.1  HCT 43.8 41.3  MCV 92.0 93.0  PLT 255 194    Basic Metabolic Panel: Recent Labs  Lab 03/10/22 1042 03/11/22 0439 03/12/22 0430  NA 143 141 143  K 3.1* 3.1* 3.7  CL 100 101 106  CO2 34* 33* 30  GLUCOSE 123* 127* 147*  BUN 10 13 15   CREATININE 0.59 0.68 0.72  CALCIUM 9.2 8.4* 8.8*  MG  --  2.2  --     GFR: Estimated Creatinine Clearance: 102.9 mL/min (by C-G formula based on SCr of 0.72 mg/dL).  Scheduled Meds:  azithromycin  500 mg Oral q1800   enoxaparin (LOVENOX) injection  40 mg Subcutaneous Q24H   influenza vac split quadrivalent PF  0.5 mL Intramuscular Tomorrow-1000   ipratropium-albuterol  3 mL Nebulization Q4H   methylPREDNISolone (SOLU-MEDROL) injection  40 mg Intravenous Q12H   pneumococcal 20-valent conjugate vaccine  0.5 mL Intramuscular Tomorrow-1000   sodium chloride flush  3 mL Intravenous Q12H   Continuous Infusions:  sodium chloride Stopped (03/10/22 1725)   cefTRIAXone (ROCEPHIN)  IV 1 g (03/13/22 1532)     LOS: 3 days   Author: Calvert Cantor  03/13/2022 3:48 PM

## 2022-03-14 ENCOUNTER — Other Ambulatory Visit (HOSPITAL_COMMUNITY): Payer: Self-pay

## 2022-03-14 DIAGNOSIS — Z720 Tobacco use: Secondary | ICD-10-CM

## 2022-03-14 DIAGNOSIS — R911 Solitary pulmonary nodule: Secondary | ICD-10-CM

## 2022-03-14 DIAGNOSIS — Z87891 Personal history of nicotine dependence: Secondary | ICD-10-CM

## 2022-03-14 DIAGNOSIS — F17201 Nicotine dependence, unspecified, in remission: Secondary | ICD-10-CM

## 2022-03-14 MED ORDER — COMBIVENT RESPIMAT 20-100 MCG/ACT IN AERS
1.0000 | INHALATION_SPRAY | Freq: Four times a day (QID) | RESPIRATORY_TRACT | 0 refills | Status: DC
  Filled 2022-03-14: qty 4, 30d supply, fill #0
  Filled 2022-03-14 (×2): qty 1, 30d supply, fill #0

## 2022-03-14 MED ORDER — PREDNISONE 10 MG PO TABS
40.0000 mg | ORAL_TABLET | Freq: Every day | ORAL | 0 refills | Status: AC
  Filled 2022-03-14: qty 20, 5d supply, fill #0

## 2022-03-14 MED ORDER — CEFUROXIME AXETIL 500 MG PO TABS
500.0000 mg | ORAL_TABLET | Freq: Two times a day (BID) | ORAL | 0 refills | Status: AC
  Filled 2022-03-14: qty 6, 3d supply, fill #0

## 2022-03-14 MED ORDER — IPRATROPIUM-ALBUTEROL 0.5-2.5 (3) MG/3ML IN SOLN: 3.0000 mL | Freq: Two times a day (BID) | RESPIRATORY_TRACT | Status: DC

## 2022-03-14 MED ORDER — ALBUTEROL SULFATE HFA 108 (90 BASE) MCG/ACT IN AERS
2.0000 | INHALATION_SPRAY | Freq: Four times a day (QID) | RESPIRATORY_TRACT | 2 refills | Status: AC | PRN
  Filled 2022-03-14: qty 18, 25d supply, fill #0
  Filled 2022-07-14: qty 18, 25d supply, fill #1

## 2022-03-14 MED ORDER — ACETAMINOPHEN 325 MG PO TABS: 650.0000 mg | ORAL_TABLET | Freq: Four times a day (QID) | ORAL | Status: DC | PRN

## 2022-03-14 MED ORDER — TRAMADOL HCL 50 MG PO TABS
50.0000 mg | ORAL_TABLET | Freq: Two times a day (BID) | ORAL | 0 refills | Status: DC | PRN
  Filled 2022-03-14: qty 14, 7d supply, fill #0

## 2022-03-14 MED ORDER — GUAIFENESIN-DM 100-10 MG/5ML PO SYRP
5.0000 mL | ORAL_SOLUTION | ORAL | 0 refills | Status: DC | PRN
  Filled 2022-03-14: qty 118, 4d supply, fill #0

## 2022-03-14 NOTE — TOC Progression Note (Addendum)
Transition of Care [facility]) - Progression Note    Patient Details  Name: [name] MRN: [id number] Date of Birth: 04/07/1971  Transition of [facility]) CM/SW Contact  Henrietta Dine, RN Phone Number: 03/14/2022, 2:23 PM  Clinical Narrative:    Contacted for [place]; spoke with pt in room; the pt says she worked until she was hospitalized; the pt say she can pay $3/medication; also explained that one [place] per year can be provided; she verbalized understanding; meds previously sent to [facility] outpatient pharmacy; [facility] letter provided to pt ID # [id number]; she will have her brother pick up the medication for her because of her isolation; also spoke with Jasmine at [place] because the pt's oxygen has not been delivered to her room; Delana Meyer says it will be delivered in the next 20-30 min; contact information for [employer] added to follow up provider section of d/c instructions; no TOC needs.   Expected Discharge Plan: Home/Self Care Barriers to Discharge: Continued Medical Work up  Expected Discharge Plan and Services Expected Discharge Plan: Home/Self Care   Discharge Planning Services: CM Consult   Living arrangements for the past 2 months: Single Family Home Expected Discharge Date: 03/14/22                                     Social Determinants of Health (SDOH) Interventions    Readmission Risk Interventions     No data to display

## 2022-03-14 NOTE — Discharge Summary (Signed)
Physician Discharge Summary  [name] IRS:[id number] DOB: Dec 30, 1970 DOA: 03/10/2022  PCP: Patient, No Pcp Per  Admit date: 03/10/2022 Discharge date: 03/14/2022 Discharging to: home Recommendations for Outpatient Follow-up:  Wean O2 as outpatient  Consults:  none Procedures:  none   Discharge Diagnoses:   Principal Problem:   Acute respiratory failure with hypoxia ([facility]) Active Problems:   CAP (community acquired pneumonia)   Parainfluenza infection   Morbid obesity ([facility])   Pulmonary nodule   Nicotine abuse     Hospital Course:  This is a 51 year old female with hypertension and nicotine abuse who presents to the hospital for shortness of breath.  CT scan of the chest revealed patchy bilateral groundglass opacities and mildly enlarged bilateral hilar lymph nodes. Also noted was a small solid pulmonary nodule in the right lower lobe measuring 5 mm. Temperature noted to be 100.4, respiratory rate 20s to 30s.  Principal Problem:   CAP (community acquired pneumonia)   Parainfluenza infection -She does admit to having a few episodes of vomiting at home early on when her respiratory symptoms started -difficult to tell if she has aspiration pneumonia versus bacterial pneumonia in the setting of a viral infection  -treated with ceftriaxone and azithromycin along with Solu-Medrol  - transition to oral antibiotics and Prednisone  Active Problems:     Acute respiratory failure with hypoxia ([facility]) -while in the ED, she was noted to have a pulse ox of 86% while on 4 L nasal cannula. -  pulse ox dropping to 84% on room air - requiring 3 L to stay at 90% - home O2 prescribed   Nicotine abuse Pulmonary nodule - She smokes "9 cigarettes/day" - Have counseled her regarding quitting - nodule right lower lobe measuring 5 mm- I have discussed this with the patient and have let her knw that she needs to have this followed as outpt     Morbid obesity ([facility]) Body mass index is  42.26 kg/m.            Discharge Instructions  Discharge Instructions     Diet - low sodium heart healthy   Complete by: As directed    Increase activity slowly   Complete by: As directed       Allergies as of 03/14/2022       Reactions   Morphine And Related Hives, Shortness Of Breath   Severe chest pain, tolerates percocet/Dilaudid   Fentanyl    Flagyl [metronidazole]    Gabapentin    Paroxetine Other (See Comments)   REACTION: delerium and panic reaction Pt states she is allergic to all antidepressants   Vicodin [hydrocodone-acetaminophen]         Medication List     TAKE these medications    acetaminophen 325 MG tablet Commonly known as: TYLENOL Take 2 tablets (650 mg total) by mouth every 6 (six) hours as needed for mild pain (or Fever >/= 101).   albuterol 108 (90 Base) MCG/ACT inhaler Commonly known as: VENTOLIN HFA Inhale 2 puffs into the lungs every 6 (six) hours as needed for wheezing or shortness of breath.   cefUROXime 500 MG tablet Commonly known as: CEFTIN Take 1 tablet (500 mg total) by mouth 2 (two) times daily with a meal for 6 doses.   Combivent Respimat 20-100 MCG/ACT Aers respimat Generic drug: Ipratropium-Albuterol Inhale 1 puff into the lungs every 6 (six) hours.   guaiFENesin-dextromethorphan 100-10 MG/5ML syrup Commonly known as: ROBITUSSIN DM Take 5 mLs by mouth every 4 (four)  hours as needed for cough.   ibuprofen 200 MG tablet Commonly known as: ADVIL Take 200 mg by mouth every 6 (six) hours as needed.   predniSONE 10 MG tablet Commonly known as: DELTASONE Take 4 tablets (40 mg total) by mouth daily for 5 days.   traMADol 50 MG tablet Commonly known as: Ultram Take 1 tablet (50 mg total) by mouth every 12 (twelve) hours as needed.               Durable Medical Equipment  (From admission, onward)           Start     Ordered   03/13/22 1548  For home use only DME oxygen  Once       Question Answer  Comment  Length of Need 6 Months   Mode or (Route) Nasal cannula   Liters per Minute 3   Frequency Continuous (stationary and portable oxygen unit needed)   Oxygen conserving device Yes   Oxygen delivery system Gas      03/13/22 1547                The results of significant diagnostics from this hospitalization (including imaging, microbiology, ancillary and laboratory) are listed below for reference.    CT Angio Chest PE W and/or Wo Contrast  Result Date: 03/10/2022 CLINICAL DATA:  Pulmonary embolus suspected EXAM: CT ANGIOGRAPHY CHEST WITH CONTRAST TECHNIQUE: Multidetector CT imaging of the chest was performed using the standard protocol during bolus administration of intravenous contrast. Multiplanar CT image reconstructions and MIPs were obtained to evaluate the vascular anatomy. RADIATION DOSE REDUCTION: This exam was performed according to the departmental dose-optimization program which includes automated exposure control, adjustment of the mA and/or kV according to patient size and/or use of iterative reconstruction technique. CONTRAST:  21mL OMNIPAQUE IOHEXOL 350 MG/ML SOLN COMPARISON:  None Available. FINDINGS: Cardiovascular: Cardiac contours are upper limits of normal in size. Normal caliber thoracic aorta with no significant atherosclerotic disease. No visible coronary artery calcifications. Dilated main pulmonary artery measuring up to 3.9 cm. No evidence of central pulmonary embolus, suboptimal contrast opacification limits evaluation of the segmental and subsegmental pulmonary arteries. Mediastinum/Nodes: Esophagus and thyroid are unremarkable. Mildly enlarged bilateral hilar lymph nodes, likely reactive. Reference hilar lymph node measuring 1.1 cm in short axis on series 3, image 76. Lungs/Pleura: Central airways are patent. Bilateral bronchial wall thickening. Bilateral patchy ground-glass opacities. Solid pulmonary nodule of the lower lobe measuring 5 mm on series 6, image  77. Linear consolidations of the right middle lobe and lingula, likely due to atelectasis. No pleural effusion or pneumothorax. Upper Abdomen: Cholecystectomy clips.  No acute abnormality. Musculoskeletal: No chest wall abnormality. No acute or significant osseous findings. Review of the MIP images confirms the above findings. IMPRESSION: 1. No evidence of central pulmonary embolus, suboptimal contrast opacification limits evaluation of the segmental and subsegmental pulmonary arteries. 2. Bilateral bronchial wall thickening and patchy bilateral ground-glass opacities, findings are likely due to atypical infectious process including viral etiology. 3. Mildly enlarged bilateral hilar lymph nodes, likely reactive. 4. Small solid pulmonary nodule of the right lower lobe measuring 5 mm. No follow-up needed if patient is low-risk.This recommendation follows the consensus statement: Guidelines for Management of Incidental Pulmonary Nodules Detected on CT Images: From the Fleischner Society 2017; Radiology 2017; 284:228-243. Electronically Signed   By: Yetta Glassman M.D.   On: 03/10/2022 13:53   DG Chest Port 1 View  Result Date: 03/10/2022 CLINICAL DATA:  Short of breath.  Flu like symptoms with fever EXAM: PORTABLE CHEST 1 VIEW COMPARISON:  Chest 02/16/2014 FINDINGS: Mild cardiac enlargement without heart failure. Lungs clear without infiltrate or effusion. No acute skeletal abnormality. IMPRESSION: No active disease. Electronically Signed   By: Marlan Palau M.D.   On: 03/10/2022 12:09   Labs:   Basic Metabolic Panel: Recent Labs  Lab 03/10/22 1042 03/11/22 0439 03/12/22 0430  NA 143 141 143  K 3.1* 3.1* 3.7  CL 100 101 106  CO2 34* 33* 30  GLUCOSE 123* 127* 147*  BUN 10 13 15   CREATININE 0.59 0.68 0.72  CALCIUM 9.2 8.4* 8.8*  MG  --  2.2  --      CBC: Recent Labs  Lab 03/10/22 1042 03/11/22 0439  WBC 7.3 10.1  NEUTROABS 5.2  --   HGB 14.0 13.1  HCT 43.8 41.3  MCV 92.0 93.0  PLT  255 244         SIGNED:   03/13/22, MD  [facility] 03/14/2022, 12:54 PM

## 2022-03-14 NOTE — Progress Notes (Signed)
This CSW was contacted by RN due to pharmacy having difficulties pushing Medicaid # through. Pt appears to have [place] Medicaid, which will not work in [place]. This CSW asked RN CM to complete MATCH for this pt.

## 2022-03-14 NOTE — Plan of Care (Signed)

## 2022-03-14 NOTE — Discharge Instructions (Signed)
You have a pulmonary nodule which needs to be followed up by your primary care doctor.   Please review all of you discharge paperwork on the day of discharge and be sure you have all of your prescribed medications.  Please request your Primary MD to go over all Hospital Tests and Procedure/Radiological results at the follow up Please get all Hospital records sent to your primary MD by signing hospital release before you go home.   In some cases, there will be blood work, cultures and biopsy results pending at the time of your discharge. Please request that your primary care M.D. goes through all the records of your hospital data and follows up on these results.  Please take all your medications with you for your next visit with your Primary MD   Please request your Primary MD to go over all hospital tests and procedure/radiological results at the follow up, please ask your Primary MD to get all Hospital records sent to his/her office.   You must read complete instructions/literature along with all the possible adverse reactions/side effects for all the Medicines you take and that have been prescribed to you. Take any new Medicines after you have completely understood and accpet all the possible adverse reactions/side effects.    Do not drive or operate heavy machinery when taking Pain medications.    Do not take more than prescribed Pain, Sleep and Anxiety Medications  If you have smoked or chewed Tobacco  in the last 2 yrs please stop smoking, stop any regular Alcohol  and or any Recreational drug use.   Wear Seat belts while driving.   If you had Pneumonia or Lung problems at the Hospital: Please get a 2 view Chest X ray done in 6-8 weeks after hospital discharge or sooner if instructed by your Primary MD.   If you have Congestive Heart Failure: Please call your Cardiologist or Primary MD anytime you have any of the following symptoms:  1) 3 pound weight gain in 24 hours or 5 pounds in  1 week  2) shortness of breath, with or without a dry hacking cough  3) swelling in the hands, feet or stomach  4) if you have to sleep on extra pillows at night in order to breathe 5) Follow cardiac low salt diet and 1.5 lit/day fluid restriction.   If you have Diabetes Accuchecks 4 times/day- once on AM empty stomach and then before each meal. Log in all results and show them to your primary doctor at your next visit. If any glucose reading is under 60 or above 400 call your primary MD immediately.   If you have Seizure/Convulsions/Epilepsy: Please do not drive, operate heavy machinery, participate in activities at heights or participate in high speed sports until you have seen by Primary MD or a Neurologist and advised to do so again. Per [facility] statutes, patients with seizures are not allowed to drive until they have been seizure-free for six months.  Use caution when using heavy equipment or power tools. Avoid working on ladders or at heights. Take showers instead of baths. Ensure the water temperature is not too high on the home water heater. Do not go swimming alone. Do not lock yourself in a room alone (i.e. bathroom). When caring for infants or small children, sit down when holding, feeding, or changing them to minimize risk of injury to the child in the event you have a seizure. Maintain good sleep hygiene. Avoid alcohol.    If  you had Gastrointestinal Bleeding: Please ask your Primary MD to check a complete blood count within one week of discharge or at your next visit. Your endoscopic/colonoscopic biopsies that are pending at the time of discharge, will also need to followed by your Primary MD.  Please note You were cared for by a hospitalist during your hospital stay. If you have any questions about your discharge medications or the care you received while you were in the hospital after you are discharged, you can call the unit and asked to speak with the hospitalist on  call if the hospitalist that took care of you is not available. Once you are discharged, your primary care physician will handle any further medical issues. Please note that NO REFILLS for any discharge medications will be authorized once you are discharged, as it is imperative that you return to your primary care physician (or establish a relationship with a primary care physician if you do not have one) for your aftercare needs so that they can reassess your need for medications and monitor your lab values.   You can reach the hospitalist office at phone [phone] or fax [phone]   If you do not have a primary care physician, you can call [phone] for a physician referral.

## 2022-03-14 NOTE — Progress Notes (Signed)
Mobility Specialist - Progress Note  Pre-mobility: 92% SpO2 During mobility: 88% SpO2 Post-mobility: 92% SPO2  03/14/22 0942  Oxygen Therapy  O2 Device Nasal Cannula  O2 Flow Rate (L/min) 3 L/min  Mobility  Activity Ambulated with assistance in hallway  Level of Assistance Standby assist, set-up cues, supervision of patient - no hands on  Assistive Device Other (Comment) (Hallway Rails)  Distance Ambulated (ft) 200 ft  Range of Motion/Exercises Active  Activity Response Tolerated well  Mobility Referral Yes  $Mobility charge 1 Mobility   Pt was found in bed and agreeable to ambulate. Pt had x1 standing rest break during ambulation and at EOS returned to bed with all necessities in reach.   Ferd Hibbs Mobility Specialist

## 2022-03-15 LAB — CULTURE, BLOOD (ROUTINE X 2)
Culture: NO GROWTH
Culture: NO GROWTH
Special Requests: ADEQUATE
Special Requests: ADEQUATE

## 2022-03-16 ENCOUNTER — Other Ambulatory Visit (HOSPITAL_COMMUNITY): Payer: Self-pay

## 2022-03-17 ENCOUNTER — Other Ambulatory Visit (HOSPITAL_COMMUNITY): Payer: Self-pay

## 2022-03-17 DIAGNOSIS — J9601 Acute respiratory failure with hypoxia: Secondary | ICD-10-CM | POA: Diagnosis not present

## 2022-03-17 DIAGNOSIS — J189 Pneumonia, unspecified organism: Secondary | ICD-10-CM | POA: Diagnosis not present

## 2022-03-18 ENCOUNTER — Other Ambulatory Visit (HOSPITAL_COMMUNITY): Payer: Self-pay

## 2022-03-18 DIAGNOSIS — J189 Pneumonia, unspecified organism: Secondary | ICD-10-CM | POA: Diagnosis not present

## 2022-03-18 DIAGNOSIS — J9601 Acute respiratory failure with hypoxia: Secondary | ICD-10-CM | POA: Diagnosis not present

## 2022-03-25 ENCOUNTER — Ambulatory Visit: Payer: Medicaid Other | Admitting: Student

## 2022-03-25 ENCOUNTER — Encounter: Payer: Self-pay | Admitting: Student

## 2022-03-25 DIAGNOSIS — J9601 Acute respiratory failure with hypoxia: Secondary | ICD-10-CM

## 2022-03-25 DIAGNOSIS — F17201 Nicotine dependence, unspecified, in remission: Secondary | ICD-10-CM

## 2022-03-25 DIAGNOSIS — R911 Solitary pulmonary nodule: Secondary | ICD-10-CM | POA: Diagnosis not present

## 2022-03-25 DIAGNOSIS — Z87891 Personal history of nicotine dependence: Secondary | ICD-10-CM | POA: Diagnosis not present

## 2022-03-25 DIAGNOSIS — Z6841 Body Mass Index (BMI) 40.0 and over, adult: Secondary | ICD-10-CM | POA: Diagnosis not present

## 2022-03-25 NOTE — Assessment & Plan Note (Signed)
Patient with 20 pack-year cigarette smoking history. She does report quitting tobacco use about 1 month ago, which I congratulated her on. She denies any cravings or withdrawals at this time and would like to maintain abstinence on her own. We discussed options to help with cravings should she need them.  -continue encouraging abstinence of tobacco use

## 2022-03-25 NOTE — Assessment & Plan Note (Signed)
She is interested in weight loss. She previously exercised regularly but this came to a halt after having two kids (currently 80 and 51 years old). This is her biggest barrier but she does have family in the area who can care for her children. She is interested in PREP which I believe she would immensely benefit from. I hope this will be able to bridge her to maintaining moderate intensity exercise for >150 minutes/week in the future.  Plan: -PREP

## 2022-03-25 NOTE — Progress Notes (Signed)
CC: hospital f/u, new to establish PCP  HPI:  [name] is a 51 y.o. female with history listed below presenting to the [facility] for hospital follow up, new to establish PCP. Please see individualized problem based charting for full HPI.  Past Medical History:  Diagnosis Date   Acid reflux    Anxiety    CAP (community acquired pneumonia) 03/11/2022   Chronic back pain    Chronic pain syndrome    Heart murmur    developed during pregnancy- "leaky valve"   Hypertension    Parainfluenza infection 03/11/2022   Past Surgical History:  Procedure Laterality Date   ANKLE SURGERY     APPENDECTOMY     CHOLECYSTECTOMY  10/12/2011   Procedure: LAPAROSCOPIC CHOLECYSTECTOMY WITH INTRAOPERATIVE CHOLANGIOGRAM;  Surgeon: Wilmon Arms. Corliss Skains, MD;  Location: [facility] OR;  Service: General;  Laterality: N/A;   DILATION AND CURETTAGE OF UTERUS     dilitation and curettage     EYE SURGERY     LAPAROSCOPIC APPENDECTOMY  10/05/2011   Procedure: APPENDECTOMY LAPAROSCOPIC;  Surgeon: Valarie Merino, MD;  Location: [facility] OR;  Service: General;  Laterality: N/A;   LASER ABLATION     WISDOM TOOTH EXTRACTION     Current Outpatient Medications on File Prior to Visit  Medication Sig Dispense Refill   acetaminophen (TYLENOL) 325 MG tablet Take 2 tablets (650 mg total) by mouth every 6 (six) hours as needed for mild pain (or Fever >/= 101).     albuterol (VENTOLIN HFA) 108 (90 Base) MCG/ACT inhaler Inhale 2 puffs into the lungs every 6 (six) hours as needed for wheezing or shortness of breath. 18 g 2   guaiFENesin-dextromethorphan (ROBITUSSIN DM) 100-10 MG/5ML syrup Take 5 mLs by mouth every 4 (four) hours as needed for cough. 118 mL 0   ibuprofen (ADVIL) 200 MG tablet Take 200 mg by mouth every 6 (six) hours as needed.     Ipratropium-Albuterol (COMBIVENT RESPIMAT) 20-100 MCG/ACT AERS respimat Inhale 1 puff into the lungs every 6 (six) hours. 4 g 0   No current facility-administered medications on file prior to  visit.   Allergies  Allergen Reactions   Morphine And Related Hives and Shortness Of Breath    Severe chest pain, tolerates percocet/Dilaudid   Fentanyl    Flagyl [Metronidazole]    Gabapentin    Paroxetine Other (See Comments)    REACTION: delerium and panic reaction Pt states she is allergic to all antidepressants   Vicodin [Hydrocodone-Acetaminophen]      Family History  Problem Relation Age of Onset   Hypertension Other    Social History   Socioeconomic History   Marital status: Single    Spouse name: Not on file   Number of children: Not on file   Years of education: Not on file   Highest education level: Not on file  Occupational History   Not on file  Tobacco Use   Smoking status: Former    Packs/day: 1.00    Years: 20.00    Total pack years: 20.00    Types: Cigarettes    Quit date: 02/25/2022    Years since quitting: 0.0   Smokeless tobacco: Never  Substance and Sexual Activity   Alcohol use: No   Drug use: No   Sexual activity: Yes    Birth control/protection: Condom    Comment: IUD removed by md in office   Other Topics Concern   Not on file  Social History Narrative   Not  on file   Social Determinants of Health   Financial Resource Strain: Low Risk  (03/25/2022)   Overall Financial Resource Strain (CARDIA)    Difficulty of Paying Living Expenses: Not very hard  Food Insecurity: No Food Insecurity (03/11/2022)   Hunger Vital Sign    Worried About Running Out of Food in the Last Year: Never true    Ran Out of Food in the Last Year: Never true  Transportation Needs: No Transportation Needs (03/11/2022)   PRAPARE - Administrator, Civil Service (Medical): No    Lack of Transportation (Non-Medical): No  Physical Activity: Not on file  Stress: Not on file  Social Connections: Socially Integrated (03/25/2022)   Social Connection and Isolation Panel [NHANES]    Frequency of Communication with Friends and Family: More than three times a  week    Frequency of Social Gatherings with Friends and Family: More than three times a week    Attends Religious Services: 1 to 4 times per year    Active Member of [employer] or Organizations: Yes    Attends Banker Meetings: More than 4 times per year    Marital Status: Living with partner  Intimate Partner Violence: Not At Risk (03/11/2022)   Humiliation, Afraid, Rape, and Kick questionnaire    Fear of Current or Ex-Partner: No    Emotionally Abused: No    Physically Abused: No    Sexually Abused: No    Review of Systems:  Negative aside from that listed in individualized problem based charting.  Physical Exam:  Vitals:   03/25/22 1448 03/25/22 1546  BP: (!) 153/82 (!) 159/83  Pulse: 74 63  Temp: 98.2 F (36.8 C)   TempSrc: Oral   SpO2: 99%   Weight: 286 lb (129.7 kg)   Height: 5\' 4"  (1.626 m)    Physical Exam Constitutional:      Appearance: She is obese. She is not ill-appearing.  Eyes:     Extraocular Movements: Extraocular movements intact.     Pupils: Pupils are equal, round, and reactive to light.  Cardiovascular:     Rate and Rhythm: Normal rate and regular rhythm.  Pulmonary:     Effort: Pulmonary effort is normal.     Comments: Difficult to appreciate lung sounds given habitus, but no overt adventitious sounds noted. Breath sounds are somewhat diminished in the bases bilaterally. She is saturating well on RA. No acute respiratory distress noted. Abdominal:     General: Bowel sounds are normal.     Palpations: Abdomen is soft.  Musculoskeletal:        General: Normal range of motion.  Skin:    General: Skin is warm and dry.  Neurological:     General: No focal deficit present.     Mental Status: She is alert and oriented to person, place, and time.  Psychiatric:        Mood and Affect: Mood normal.        Behavior: Behavior normal.      Assessment & Plan:   See Encounters Tab for problem based charting.  Patient discussed with Dr.   

## 2022-03-25 NOTE — Patient Instructions (Addendum)
[name],  It was a pleasure seeing you in the clinic today.   I have ordered a sleep study test to check for sleep apnea. They will contact you to arrange this. I have ordered a supervised exercise program for you. They will call you to arrange this as well. Please continue to refrain from using cigarettes as this is the best thing you can do to prevent further lung damage.  Your oxygen levels were good here in the clinic so you do not need oxygen anymore at home. Please come back to see [place] in 2 months.  Please call our clinic at [phone] if you have any questions or concerns. The best time to call is Monday-Friday from 9am-4pm, but there is someone available 24/7 at the same number. If you need medication refills, please notify your pharmacy one week in advance and they will send [place] a request.   Thank you for letting us take part in your care. We look forward to seeing you next time!

## 2022-03-25 NOTE — Assessment & Plan Note (Signed)
Patient recently admitted for acute hypoxic respiratory failure found to be 2/2 CAP. RVP showed parainfluenza. She was treated with rocephin, azithromycin, and steroids while admitted and discharged with course of cefuroxime and prednisone to finish course which she has. She was also discharged with albuterol and combivent respimat for maintenance therapy. She did require oxygen at discharge and thus was sent home with home O2 at 3L [place].   Since discharge, she reports feeling significantly improved, although she does not yet feel back to her usual baseline from a lung standpoint. She has been weaning her oxygen at home on her own, down to 1.5L [place] with activity. She does feel subjective SHOB with exertion and has occasional nighttime awakenings for this as well, but reports that her lowest oxygen saturations at home have been 90%. She has stopped smoking cigarettes and marijuana as of a month ago, which I congratulated her on.   Ambulatory O2 sats were obtained in the clinic without oxygen, revealing saturations that peaked at 96% with a trough of 93%.  She has a 20 pack-year history of cigarette use. She is at intermediate risk for OSA (STOPBANG 4) and would benefit from a sleep study which she is agreeable to. I do think she is at risk for an obstructive lung process as well given significant smoking history. Will allow for lungs to heal from recent viral pneumonia and obtain pulmonary function testing at next visit to rule out COPD/asthma.   Plan: -stop home oxygen -continue albuterol and combivent  -sleep study to r/o OSA -PFTs at next visit in 2 months -continue abstinence of tobacco use

## 2022-03-25 NOTE — Assessment & Plan Note (Signed)
>>  ASSESSMENT AND PLAN FOR MORBID OBESITY (HCC) WRITTEN ON 03/25/2022  5:11 PM BY Merrilyn Puma, MD  She is interested in weight loss. She previously exercised regularly but this came to a halt after having two kids (currently 32 and 51 years old). This is her biggest barrier but she does have family in the area who can care for her children. She is interested in PREP which I believe she would immensely benefit from. I hope this will be able to bridge her to maintaining moderate intensity exercise for >150 minutes/week in the future.  Plan: -PREP

## 2022-03-27 ENCOUNTER — Telehealth: Payer: Self-pay

## 2022-03-27 NOTE — Telephone Encounter (Signed)
Call to pt reference PREP referral  Explained program Would like to participate however transportation is a barrier. Will need to check with Mom to see what time she could provide transport Could do a 1030a class on M/W at [facility] Will call me back once transport confirmed Will contact her either way in Jan to re-offer class

## 2022-03-30 NOTE — Progress Notes (Signed)
Internal Medicine Clinic Attending  Case discussed with Dr. Jinwala  At the time of the visit.  We reviewed the resident's history and exam and pertinent patient test results.  I agree with the assessment, diagnosis, and plan of care documented in the resident's note.  

## 2022-04-16 DIAGNOSIS — J189 Pneumonia, unspecified organism: Secondary | ICD-10-CM | POA: Diagnosis not present

## 2022-04-16 DIAGNOSIS — J9601 Acute respiratory failure with hypoxia: Secondary | ICD-10-CM | POA: Diagnosis not present

## 2022-04-17 DIAGNOSIS — J189 Pneumonia, unspecified organism: Secondary | ICD-10-CM | POA: Diagnosis not present

## 2022-04-17 DIAGNOSIS — J9601 Acute respiratory failure with hypoxia: Secondary | ICD-10-CM | POA: Diagnosis not present

## 2022-04-24 ENCOUNTER — Encounter: Payer: Medicaid Other | Admitting: Internal Medicine

## 2022-05-14 ENCOUNTER — Telehealth: Payer: Self-pay

## 2022-05-14 NOTE — Telephone Encounter (Signed)
Attempted to reach pt for PREP class. Number on file no longer working.  Send email for request contact back if still interested in participating.

## 2022-05-17 DIAGNOSIS — J189 Pneumonia, unspecified organism: Secondary | ICD-10-CM | POA: Diagnosis not present

## 2022-05-17 DIAGNOSIS — J9601 Acute respiratory failure with hypoxia: Secondary | ICD-10-CM | POA: Diagnosis not present

## 2022-05-18 DIAGNOSIS — J9601 Acute respiratory failure with hypoxia: Secondary | ICD-10-CM | POA: Diagnosis not present

## 2022-05-18 DIAGNOSIS — J189 Pneumonia, unspecified organism: Secondary | ICD-10-CM | POA: Diagnosis not present

## 2022-05-30 ENCOUNTER — Other Ambulatory Visit: Payer: Self-pay

## 2022-05-30 ENCOUNTER — Other Ambulatory Visit (HOSPITAL_COMMUNITY): Payer: Self-pay

## 2022-06-05 ENCOUNTER — Other Ambulatory Visit (HOSPITAL_COMMUNITY): Payer: Self-pay

## 2022-06-17 DIAGNOSIS — J189 Pneumonia, unspecified organism: Secondary | ICD-10-CM | POA: Diagnosis not present

## 2022-06-17 DIAGNOSIS — J9601 Acute respiratory failure with hypoxia: Secondary | ICD-10-CM | POA: Diagnosis not present

## 2022-06-18 DIAGNOSIS — J9601 Acute respiratory failure with hypoxia: Secondary | ICD-10-CM | POA: Diagnosis not present

## 2022-06-18 DIAGNOSIS — J189 Pneumonia, unspecified organism: Secondary | ICD-10-CM | POA: Diagnosis not present

## 2022-06-22 ENCOUNTER — Encounter: Payer: Self-pay | Admitting: Family Medicine

## 2022-06-22 ENCOUNTER — Ambulatory Visit (INDEPENDENT_AMBULATORY_CARE_PROVIDER_SITE_OTHER): Payer: Medicaid Other | Admitting: Family Medicine

## 2022-06-22 VITALS — BP 167/92 | HR 70 | Ht 64.0 in | Wt 296.1 lb

## 2022-06-22 DIAGNOSIS — R911 Solitary pulmonary nodule: Secondary | ICD-10-CM | POA: Diagnosis not present

## 2022-06-22 DIAGNOSIS — F17201 Nicotine dependence, unspecified, in remission: Secondary | ICD-10-CM | POA: Diagnosis not present

## 2022-06-22 DIAGNOSIS — I10 Essential (primary) hypertension: Secondary | ICD-10-CM

## 2022-06-22 DIAGNOSIS — R011 Cardiac murmur, unspecified: Secondary | ICD-10-CM | POA: Diagnosis not present

## 2022-06-22 MED ORDER — AMLODIPINE BESYLATE 5 MG PO TABS: 5.0000 mg | ORAL_TABLET | Freq: Every day | ORAL | 3 refills | Status: DC

## 2022-06-22 NOTE — Assessment & Plan Note (Signed)
Patient has had multiple elevated BP readings over last couple encounters. Will start amlodipine 5 mg daily. Will obtain updated CMP at next visit to assess renal function for possibility of secondary HTN.

## 2022-06-22 NOTE — Progress Notes (Signed)
    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, DDD, sciatica, scoliosis, remote hx asthma (takes combivent), systolic murmur when pregnant, acid reflux  Meds: ibuprofen (decreased from 800 mg TID now to 400 mg TID after stomach pain/vomiting), combivent inhaler though has been out, omeprazole 20 mg daily  Hospitalizations: in 02/2023 for CAP, pulled out "pupil" in left eye when 52 years old, admitted for refractory seizures when 3 and [age] yo (no further episodes since)  Surgeries: open ankle fracture and right leg fracture with repair, appendectomy, lap chole, uterine ablation  FH: grandmother with lung cancer, no hx DM and HTN, mother with graves disease  SH: stopped smoking 4 months ago (from half pack for 3 years and 5-6 per day for 6 years), marijuana a couple times a month, no alcohol  OBJECTIVE:   BP (!) 167/92   Pulse 70   Ht 5\' 4"  (1.626 m)   Wt 296 lb 2 oz (134.3 kg)   SpO2 97%   BMI 50.83 kg/m   General: Alert and oriented, in NAD Skin: Warm, dry, and intact without lesions HEENT: NCAT, EOM grossly normal, midline nasal septum Cardiac: RRR, no m/r/g appreciated Respiratory: CTAB, breathing and speaking comfortably on RA Abdominal: Soft, nontender, nondistended, normoactive bowel sounds Extremities: Moves all extremities grossly equally Neurological: No gross focal deficit Psychiatric: Appropriate mood and affect  ASSESSMENT/PLAN:   Hypertension Patient has had multiple elevated BP readings over last couple encounters. Will start amlodipine 5 mg daily. Will obtain updated CMP at next visit to assess renal function for possibility of secondary HTN.  Pulmonary nodule Patient has quit smoking and reported about 10 years of 0.5 PPD smoking; however, per chart  review, she is listed as having 20 pack year history. Will follow up history with patient at next visit and discuss repeat imaging to assess nodule.  SYSTOLIC MURMUR No murmur appreciated on my exam. Given history, suspect benign systolic ejection murmur of pregnancy that has resolved with decreased intravascular fluid.  Tobacco use disorder, severe, in early remission Has been quit for 4 months. Congratulated patient on this great milestone. Will continue to follow and provide resources as desired.   Health maintenance Because patient was unfortunately late to her appointment, was not able to delve into medical conditions fully. Will follow up pulmonary health, ibuprofen use/GI upset, generalized pain, and care gaps at subsequent visits.  Ethelene Hal, MD [place]

## 2022-06-22 NOTE — Patient Instructions (Signed)
It was great to see you today! Here's what we talked about:  I have given you amlodipine for your blood pressure. Be sure to take this daily. We will follow up at your next visit. Schedule an appointment for 1-2 weeks so we can further delve into your concerns today.  Please let me know if you have any other questions!  Dr. Marcha Dutton

## 2022-06-22 NOTE — Assessment & Plan Note (Signed)
Patient has quit smoking and reported about 10 years of 0.5 PPD smoking; however, per chart review, she is listed as having 20 pack year history. Will follow up history with patient at next visit and discuss repeat imaging to assess nodule.

## 2022-06-22 NOTE — Assessment & Plan Note (Signed)
Has been quit for 4 months. Congratulated patient on this great milestone. Will continue to follow and provide resources as desired.

## 2022-06-22 NOTE — Assessment & Plan Note (Signed)
No murmur appreciated on my exam. Given history, suspect benign systolic ejection murmur of pregnancy that has resolved with decreased intravascular fluid.

## 2022-06-29 ENCOUNTER — Ambulatory Visit: Payer: Medicaid Other | Admitting: Family Medicine

## 2022-07-06 ENCOUNTER — Ambulatory Visit: Payer: Medicaid Other | Admitting: Family Medicine

## 2022-07-07 ENCOUNTER — Other Ambulatory Visit (HOSPITAL_COMMUNITY): Payer: Self-pay

## 2022-07-14 ENCOUNTER — Other Ambulatory Visit (HOSPITAL_COMMUNITY): Payer: Self-pay

## 2022-07-14 ENCOUNTER — Other Ambulatory Visit: Payer: Self-pay | Admitting: Family Medicine

## 2022-07-14 MED ORDER — COMBIVENT RESPIMAT 20-100 MCG/ACT IN AERS: 1.0000 | INHALATION_SPRAY | Freq: Four times a day (QID) | RESPIRATORY_TRACT | 0 refills | Status: DC

## 2022-07-16 DIAGNOSIS — J9601 Acute respiratory failure with hypoxia: Secondary | ICD-10-CM | POA: Diagnosis not present

## 2022-07-16 DIAGNOSIS — J189 Pneumonia, unspecified organism: Secondary | ICD-10-CM | POA: Diagnosis not present

## 2022-07-17 DIAGNOSIS — J189 Pneumonia, unspecified organism: Secondary | ICD-10-CM | POA: Diagnosis not present

## 2022-07-17 DIAGNOSIS — J9601 Acute respiratory failure with hypoxia: Secondary | ICD-10-CM | POA: Diagnosis not present

## 2022-07-22 ENCOUNTER — Other Ambulatory Visit (HOSPITAL_COMMUNITY): Payer: Self-pay

## 2022-07-23 ENCOUNTER — Encounter: Payer: Self-pay | Admitting: Family Medicine

## 2022-07-23 ENCOUNTER — Other Ambulatory Visit: Payer: Self-pay

## 2022-07-23 ENCOUNTER — Ambulatory Visit: Payer: Medicaid Other | Admitting: Family Medicine

## 2022-07-23 VITALS — BP 143/88 | HR 73 | Ht 64.0 in | Wt 298.0 lb

## 2022-07-23 DIAGNOSIS — F17201 Nicotine dependence, unspecified, in remission: Secondary | ICD-10-CM

## 2022-07-23 DIAGNOSIS — G5600 Carpal tunnel syndrome, unspecified upper limb: Secondary | ICD-10-CM | POA: Insufficient documentation

## 2022-07-23 DIAGNOSIS — Z122 Encounter for screening for malignant neoplasm of respiratory organs: Secondary | ICD-10-CM | POA: Diagnosis not present

## 2022-07-23 DIAGNOSIS — Z1231 Encounter for screening mammogram for malignant neoplasm of breast: Secondary | ICD-10-CM | POA: Diagnosis not present

## 2022-07-23 DIAGNOSIS — Z1211 Encounter for screening for malignant neoplasm of colon: Secondary | ICD-10-CM | POA: Diagnosis not present

## 2022-07-23 DIAGNOSIS — R911 Solitary pulmonary nodule: Secondary | ICD-10-CM | POA: Diagnosis not present

## 2022-07-23 DIAGNOSIS — Z6841 Body Mass Index (BMI) 40.0 and over, adult: Secondary | ICD-10-CM

## 2022-07-23 DIAGNOSIS — T7840XA Allergy, unspecified, initial encounter: Secondary | ICD-10-CM | POA: Insufficient documentation

## 2022-07-23 DIAGNOSIS — I10 Essential (primary) hypertension: Secondary | ICD-10-CM

## 2022-07-23 DIAGNOSIS — G5603 Carpal tunnel syndrome, bilateral upper limbs: Secondary | ICD-10-CM

## 2022-07-23 MED ORDER — AMLODIPINE BESYLATE 5 MG PO TABS: 10.0000 mg | ORAL_TABLET | Freq: Every day | ORAL | 3 refills | Status: DC

## 2022-07-23 MED ORDER — COMBIVENT RESPIMAT 20-100 MCG/ACT IN AERS: 1.0000 | INHALATION_SPRAY | Freq: Four times a day (QID) | RESPIRATORY_TRACT | 0 refills | Status: DC

## 2022-07-23 NOTE — Assessment & Plan Note (Signed)
Worsening with conservative management. Discussed sports medicine referral for likely injection vs hand surgery consultation for definitive management. Patient would like to try injections first; will refer to [facility].

## 2022-07-23 NOTE — Assessment & Plan Note (Signed)
Resolved. Was on combivent after leaving the hospital and had marked improvement in respiratory function. She has been out of this and has noticed more SOB. Consider COPD vs asthma vs BMI as contributors. Asked patient to schedule with Dr. Valentina Lucks for spirometry to assess COPD. For now, will refill combivent.

## 2022-07-23 NOTE — Assessment & Plan Note (Signed)
Has had multiple years of difficulty with weight. Discussed BMR and appropriate caloric intake as well as exercise. Will also refer to weight management for more assistance is developing a lifestyle plan that works for her.
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
## 2022-07-23 NOTE — Assessment & Plan Note (Signed)
Appears to be 5 mm with no follow up if low risk. However, she does have >20 pack year history and is higher risk; therefore, will order LDCT as below to further characterize.

## 2022-07-23 NOTE — Assessment & Plan Note (Signed)
Remains in remission

## 2022-07-23 NOTE — Assessment & Plan Note (Signed)
Patient endorses history of congestion, runny nose, and phlegm. Has not tried a second generation antihistamine. Recommended OTC claritin and assess improvement of what is likely allergies.

## 2022-07-23 NOTE — Patient Instructions (Addendum)
Lung function testing On your way out today, please book an appointment with our clinical pharmacist, Dr. Valentina Lucks, to comeback and do lung function testing.   PAP smear You are due for a PAP smear to screen for cervical cancer. Please book this with our clinic at your convenience.   Weight management I have referred you to [facility] for diet counseling  Someone from their office should be calling you in 1 to 2 weeks to schedule an appointment.  If you do not hear from them, let us know. We may need to nudge along the referral.   [facility] [phone] [place].  [place], [place] [address]  Colon cancer screening I have referred you to GI for routine colon cancer screening with colonoscopy.  Someone from their office should be calling you in 1 to 2 weeks to schedule an appointment.  If you do not hear from them, let us know. We may need to nudge along the referral.    Mammogram I have ordered your routine mammogram to screen for breast cancer. This will be at the [facility]. You will call them directly to make an appointment at your convenience. Information below.     Dental list         Updated 11.20.18 These dentists all accept Medicaid.  The list is a courtesy and for your convenience. Estos dentistas aceptan Medicaid.  La lista es para su [place] y es una cortesa.     [facility]     [phone] [place] [address] Se habla espaol From 97 to 78 years old Parent may go with child only for cleaning Anette Riedel DDS     [place], [place] ([place] speaking) [address] [place]  [address] Se habla espaol From 44 to 74 years old Parent may go with child   Rolene Arbour DMD    [id number] [place] [address] Se habla espaol Vietnamese spoken From 55 years old Parent may go with child [facility]     [phone] [place]. [place] [address] Se habla espaol From 32 to 59 years old Parent may NOT go with child  Marcelo Baldy DDS  [phone] [facility]      [address]  [place] [address] Power spoken (preferred to bring translator) From teeth coming in to 19 years old Parent may go with child  [facility]     [phone] [address] [place]. [place] [id number] Requires certification. Call for information. Requiere certificacin. Llame para informacin. Algunos dias se habla espaol  From birth to 46 years Parent possibly goes with child   Kandice Hams DDS     [address] [place] [address] Se habla espaol From 18 months to 18 years  Parent may go with child  J. [facility] DDS     Merry Proud DDS  [phone] [address] [place] [address] Se habla espaol From 64 year old Parent may go with child   Shelton Silvas DDS    [place] [address] Se habla espaol  From 69 months to 6 years old Parent may go with child Ivory Broad DDS    [phone] [address] [place] [address] Se habla espaol From 82 to 47 years old Parent may  go with child  [facility]    [place]. [place] [address] No se Joneen [place]away From birth [facility]  [phone] [address] [place] [address] Se habla espanol Interpretation for other languages Special needs children welcome  Moss Mc, DDS PA     [phone] [place].  [place], [place] [address] From 52 years old   Special needs children welcome  [facility]   [phone] Dr. Janeice Robinson [address] [place], [place] [address] Se habla espaol From birth to 2 years Special needs children welcome   [facility] [place] [phone] [address] [place], [place]  [address]   [place] [phone] [place], [place] [address]      Therapy and Counseling Resources Most providers on this list will take Medicaid. Patients with commercial insurance or Medicare should contact their insurance company to get a list of in network providers.  [facility] (takes children) Location 1: [address], [place], [place] [address] Location 2: [place], [place] [address] [place] ([place] speaking therapist available)(habla espanol)(take medicare and medicaid)  [place], [place], [place] [address], [place] [email or web address] [phone]  [facility] [address] [place], [place] [address] [place], [place], [place] [address]      [phone]  [place] (spanish available) [place], [place] [address] [place] (take [facility] and medicare) [address] [place],  [address]       [phone]     [place] (virtual only) [phone]  Jinny Blossom [facility] [address], [place], [place]  [facility]:  [place]. [place] [phone]  [facility]:  [address] [place] [phone]  [facility] (under & uninsured) [address], [place] [phone]    [email or web address]    [place] [address]  [place]    [phone]  [facility] [place]     Phone:  [phone]     [place]  [place] [address]      [place], [place] ext [place]: [place], [place], [place]    [place] ([place] therapist) [email or web address]  [place] [address]   [place] [address]    [phone]    [facility]   [address],  [place], [place]   [place]  [place]  [facility]  [address] [place], [place]        [phone]  Open Access/Walk In Clinic under & uninsured  [facility]  [address] [place], [place] Crisis [phone]  [facility] of the [place],  ([place])   [place], [place]: [phone]) 8:30 - 12; 1 - 2:30  [facility] of the [place],  [place], [place]    [phone]):8:30 - 12; 2 - 3PM  [facility] [employer],  [address],  [place]; [phone]):  Mon - Fri 8 AM - 5 PM  [facility] [place]  MWF 12:30 to 3:00 or call to schedule an appointment  [phone]  Specific Provider options Psychology Today  [email or web address] click on find a therapist  enter your zip code left side and select or tailor a therapist for your specific need.   [facility] Provider Directory [email or web address]  (Medicaid)   Follow all drop down to find a provider  [place] or [email or web address] [address], [place], [place] Recovery support and educational   24- Hour Availability:   [facility]  [address] [place], [place] Crisis [phone]  Family Service of the [employer] [phone]  [place]  [phone]   [place]  [phone] (after hours)  Therapeutic Alternative/Mobile Crisis   [phone]  [place] National Suicide Hotline  [phone] Diamantina Monks)  Call 911 or go  to emergency room  [facility]  [phone]);  [place] and [employer]  [phone]); [place], [place], [place], [place], [place], [place], [place]

## 2022-07-23 NOTE — Assessment & Plan Note (Signed)
BP remains elevated on amlodipine 5 mg daily. Will increase dose to 10 mg to further improve control. Will assess control at next visit.

## 2022-08-16 DIAGNOSIS — J9601 Acute respiratory failure with hypoxia: Secondary | ICD-10-CM | POA: Diagnosis not present

## 2022-08-16 DIAGNOSIS — J189 Pneumonia, unspecified organism: Secondary | ICD-10-CM | POA: Diagnosis not present

## 2022-08-17 DIAGNOSIS — J9601 Acute respiratory failure with hypoxia: Secondary | ICD-10-CM | POA: Diagnosis not present

## 2022-08-17 DIAGNOSIS — J189 Pneumonia, unspecified organism: Secondary | ICD-10-CM | POA: Diagnosis not present

## 2022-08-18 ENCOUNTER — Encounter: Payer: Self-pay | Admitting: Pharmacist

## 2022-08-18 ENCOUNTER — Ambulatory Visit (INDEPENDENT_AMBULATORY_CARE_PROVIDER_SITE_OTHER): Payer: Medicaid Other | Admitting: Pharmacist

## 2022-08-18 VITALS — BP 152/81 | HR 73 | Wt 302.0 lb

## 2022-08-18 DIAGNOSIS — J9601 Acute respiratory failure with hypoxia: Secondary | ICD-10-CM

## 2022-08-18 DIAGNOSIS — Z87891 Personal history of nicotine dependence: Secondary | ICD-10-CM

## 2022-08-18 NOTE — Assessment & Plan Note (Signed)
History of tobacco use disorder - quit in October 2023 at time of hospitalization.  Denies smoking since.  - Verbalized high level of confidence in ability to never return to smoking.  - Encouraged continued abstinence.

## 2022-08-18 NOTE — Progress Notes (Signed)
   S:     Chief Complaint  Patient presents with   Medication Management    PFT/Spirometry   AKAISHA HENCKEL is a 52 y.o. female who presents for lung function evaluation.  PMH is significant for Acute respiratory failure with hypoxia.  Patient was referred and last seen by Primary Care Provider, Dr. Marcha Dutton, on 07/23/22.  At last visit, Patient had noticed more SOB after running out of her Combivent (albuterol/ipratropium) inhaler, was advised to complete spirometry test to assess COPD.  Patient reports breathing has been difficult. She can do some activities but runs out of breathe during most activities. Uses inhalers when she is out of breathe and that seems to resolve the issues, but she has to use both inhalers often daily.   Medication adherence reported as higher than expected with multiple doses daily of both albuterol and Combivent inhalers.  Patient reports last dose of COPD medications was this morning where she took a dose of albuterol. Current COPD medications: Combivent respimat (Ipratropium-Albuterol) 20-100 mcg, Ventolin (albuterol) 108 mcg Rescue inhaler use frequency: 10 times per day for albuterol, 4-5 times per day for Ipratropium-Albuterol Patient exacerbation hx: Patient has experienced some exacerbations which she resolves by using inhalers.  O: Review of Systems  Respiratory:  Positive for sputum production, shortness of breath and wheezing.   All other systems reviewed and are negative.   Physical Exam Constitutional:      Appearance: Normal appearance. She is obese.  Pulmonary:     Breath sounds: Wheezing present.  Neurological:     Mental Status: She is alert.  Psychiatric:        Mood and Affect: Mood normal.        Behavior: Behavior normal.        Thought Content: Thought content normal.        Judgment: Judgment normal.     Vitals:   08/18/22 1103 08/18/22 1106  BP: (!) 151/87 (!) 152/81  Pulse: 72 73  SpO2: 95% 93%    mMRC score= >2 CAT  score= 28 See Documentation Flowsheet - CAT/COPD for complete symptom scoring.  See "scanned report" or Documentation Flowsheet (discrete results - PFTs) for Spirometry results. Patient provided good effort while attempting spirometry.   Lung Age = 89 Albuterol Neb  Lot# [id number]     Exp. Feb. 2025  Patient is participating in a Managed Medicaid Plan:  Yes   A/P: Patient has been experiencing breathing problems since 2023 and taking two inhalers.  Spirometry evaluation reveals  possible  restrictive lung disease. Post nebulized albuterol tx revealed possible restrictive lung disease. No significant pre-post change. Patient medication adherence good.  -No changes to current medications -Patient advised to follow-up with PCP to proceed with next steps in her evaluation/treatment plan. -Patient advised to use her Oxygen Saturation monitor when symptomatic and encouraged her to continue or increase her exercise/exertion level to understand how ADLs impact her oxygen. -Reviewed results of pulmonary function tests. Pt verbalized understanding of results and education.    History of tobacco use disorder - quit in October 2023 at time of hospitalization.  Denies smoking since.  - Verbalized high level of confidence in ability to never return to smoking.  - Encouraged continued abstinence.    Written patient instructions provided.   Total time in face to face counseling 30 minutes.    Patient seen with Gena Fray, PharmD PGY-1 Pharmacy Resident and Estelle June, PharmD Candidate.

## 2022-08-18 NOTE — Patient Instructions (Addendum)
It was nice seeing you today!   We have no medication changes for you!

## 2022-08-18 NOTE — Assessment & Plan Note (Signed)
Patient has been experiencing breathing problems since 2023 and taking two inhalers.  Spirometry evaluation reveals possible restrictive lung disease. Post nebulized albuterol tx revealed possible restrictive lung disease. No significant pre-post change. Patient medication adherence good.  -No changes to current medications -Patient advised to follow-up with PCP to proceed with next steps in her evaluation/treatment plan. -Patient advised to use her Oxygen Saturation monitor when symptomatic and encouraged her to continue or increase her exercise/exertion level to understand how ADLs impact her oxygen. -Reviewed results of pulmonary function tests. Pt verbalized understanding of results and education.

## 2022-08-19 NOTE — Progress Notes (Signed)
Reviewed and agree with Dr Koval's plan.   

## 2022-08-25 ENCOUNTER — Telehealth: Payer: Self-pay

## 2022-08-25 DIAGNOSIS — I10 Essential (primary) hypertension: Secondary | ICD-10-CM

## 2022-08-25 MED ORDER — AMLODIPINE-OLMESARTAN 10-20 MG PO TABS: 1.0000 | ORAL_TABLET | Freq: Every day | ORAL | 0 refills | Status: DC

## 2022-08-25 NOTE — Telephone Encounter (Signed)
Patient returns call to nurse line. [facility] does not have this in stock either.   Would patient be able to receive a rx for amlodipine 10 mg for a few days to last her until combination medication is received?   Veronda Prude, RN

## 2022-08-25 NOTE — Telephone Encounter (Signed)
Patient calls nurse line regarding needing new prescription on Amlodipine. She has been taking 2 tablets in the evening, and is now out.   Per chart review, rx on 07/23/22 was not sent electronically.   Also, insurance prefers that dosage of medication be increased to 10 mg instead of 2, 5 mg tablets.   If appropriate, [facility] place [facility] [address].   Veronda Prude, RN

## 2022-08-25 NOTE — Telephone Encounter (Signed)
Increased to amlodipine 10 mg daily at last visit 3/7. On 4/2, BP remained elevated. Will send in amlodipine-olmesartan 10-20 mg daily with follow up at next appointment. Please advise patient of this change to better control her BP.

## 2022-08-25 NOTE — Telephone Encounter (Signed)
Called patient. She states that she received notification from pharmacy regarding new medication. They do not have medication in stock and will take a few days for shipment to come in.   She states that she will [facility] [address] to see if they have this in stock.   She will call back if she is not able to find medication in stock. She has not had BP medication since Sunday.   Veronda Prude, RN

## 2022-08-26 MED ORDER — OMEPRAZOLE 20 MG PO CPDR: 20.0000 mg | DELAYED_RELEASE_CAPSULE | Freq: Every day | ORAL | 3 refills | Status: DC

## 2022-08-26 MED ORDER — AMLODIPINE BESYLATE 10 MG PO TABS: 10.0000 mg | ORAL_TABLET | Freq: Every day | ORAL | 0 refills | Status: DC

## 2022-08-26 NOTE — Addendum Note (Signed)
Addended by: Evette Georges B on: 08/26/2022 08:25 AM   Modules accepted: Orders

## 2022-08-26 NOTE — Telephone Encounter (Signed)
Called patient and advised of provider message.   Patient is also asking if provider can send her in a prescription for acid reflux. She states that famotidine does not work for her and she is requesting rx for omeprazole.   Please advise.   Veronda Prude, RN

## 2022-08-26 NOTE — Telephone Encounter (Signed)
Omeprazole 20 mg daily sent. Thanks!

## 2022-08-26 NOTE — Addendum Note (Signed)
Addended by: Evette Georges B on: 08/26/2022 12:32 PM   Modules accepted: Orders

## 2022-08-26 NOTE — Telephone Encounter (Signed)
Sent in 10 tabs of amlodipine 10 mg daily until combo pill is in stock.

## 2022-08-27 ENCOUNTER — Ambulatory Visit: Payer: Medicaid Other | Admitting: Family Medicine

## 2022-08-27 NOTE — Telephone Encounter (Signed)
Patient called, she did not answer. LVM asking patient to return call to office.  Veronda Prude, RN

## 2022-09-03 ENCOUNTER — Other Ambulatory Visit: Payer: Self-pay | Admitting: Family Medicine

## 2022-09-15 DIAGNOSIS — J9601 Acute respiratory failure with hypoxia: Secondary | ICD-10-CM | POA: Diagnosis not present

## 2022-09-15 DIAGNOSIS — J189 Pneumonia, unspecified organism: Secondary | ICD-10-CM | POA: Diagnosis not present

## 2022-09-16 DIAGNOSIS — J9601 Acute respiratory failure with hypoxia: Secondary | ICD-10-CM | POA: Diagnosis not present

## 2022-09-16 DIAGNOSIS — J189 Pneumonia, unspecified organism: Secondary | ICD-10-CM | POA: Diagnosis not present

## 2022-09-18 ENCOUNTER — Other Ambulatory Visit: Payer: Self-pay | Admitting: Family Medicine

## 2022-09-21 ENCOUNTER — Ambulatory Visit: Payer: Medicaid Other | Admitting: Student

## 2022-09-29 ENCOUNTER — Ambulatory Visit: Payer: Medicaid Other

## 2022-10-15 ENCOUNTER — Ambulatory Visit
Admission: RE | Admit: 2022-10-15 | Discharge: 2022-10-15 | Disposition: A | Discharge status: Home / Self Care | Payer: Medicaid Other | Source: Ambulatory Visit | Attending: Family Medicine | Admitting: Family Medicine

## 2022-10-15 DIAGNOSIS — Z122 Encounter for screening for malignant neoplasm of respiratory organs: Secondary | ICD-10-CM

## 2022-10-15 DIAGNOSIS — Z87891 Personal history of nicotine dependence: Secondary | ICD-10-CM | POA: Diagnosis not present

## 2022-10-16 DIAGNOSIS — J9601 Acute respiratory failure with hypoxia: Secondary | ICD-10-CM | POA: Diagnosis not present

## 2022-10-16 DIAGNOSIS — J189 Pneumonia, unspecified organism: Secondary | ICD-10-CM | POA: Diagnosis not present

## 2022-10-17 DIAGNOSIS — J189 Pneumonia, unspecified organism: Secondary | ICD-10-CM | POA: Diagnosis not present

## 2022-10-17 DIAGNOSIS — J9601 Acute respiratory failure with hypoxia: Secondary | ICD-10-CM | POA: Diagnosis not present

## 2022-10-20 ENCOUNTER — Ambulatory Visit: Payer: Medicaid Other | Admitting: Family Medicine

## 2022-10-20 ENCOUNTER — Telehealth: Payer: Self-pay

## 2022-10-20 NOTE — Telephone Encounter (Signed)
Patient calls nurse line requesting to speak with provider regarding results from CT Lung Cancer screening.   She was able to view this report via mychart and has concerns regarding scarring/atelectasis in right middle lobe.   Will forward to PCP. Please return call to patient at [phone].  Veronda Prude, RN

## 2022-11-15 DIAGNOSIS — J9601 Acute respiratory failure with hypoxia: Secondary | ICD-10-CM | POA: Diagnosis not present

## 2022-11-15 DIAGNOSIS — J189 Pneumonia, unspecified organism: Secondary | ICD-10-CM | POA: Diagnosis not present

## 2022-11-16 DIAGNOSIS — J9601 Acute respiratory failure with hypoxia: Secondary | ICD-10-CM | POA: Diagnosis not present

## 2022-11-16 DIAGNOSIS — J189 Pneumonia, unspecified organism: Secondary | ICD-10-CM | POA: Diagnosis not present

## 2022-11-26 ENCOUNTER — Ambulatory Visit: Payer: Medicaid Other

## 2022-12-14 ENCOUNTER — Other Ambulatory Visit: Payer: Self-pay

## 2022-12-14 MED ORDER — OMEPRAZOLE 20 MG PO CPDR: 20.0000 mg | DELAYED_RELEASE_CAPSULE | Freq: Every day | ORAL | 3 refills | Status: DC

## 2022-12-16 DIAGNOSIS — J189 Pneumonia, unspecified organism: Secondary | ICD-10-CM | POA: Diagnosis not present

## 2022-12-16 DIAGNOSIS — J9601 Acute respiratory failure with hypoxia: Secondary | ICD-10-CM | POA: Diagnosis not present

## 2022-12-17 DIAGNOSIS — Z0289 Encounter for other administrative examinations: Secondary | ICD-10-CM

## 2022-12-17 DIAGNOSIS — J9601 Acute respiratory failure with hypoxia: Secondary | ICD-10-CM | POA: Diagnosis not present

## 2022-12-17 DIAGNOSIS — J189 Pneumonia, unspecified organism: Secondary | ICD-10-CM | POA: Diagnosis not present

## 2022-12-21 ENCOUNTER — Other Ambulatory Visit: Payer: Self-pay | Admitting: Family Medicine

## 2022-12-22 ENCOUNTER — Encounter (INDEPENDENT_AMBULATORY_CARE_PROVIDER_SITE_OTHER): Payer: Medicaid Other | Admitting: Physician Assistant

## 2022-12-26 ENCOUNTER — Encounter (INDEPENDENT_AMBULATORY_CARE_PROVIDER_SITE_OTHER): Payer: Self-pay | Admitting: Physician Assistant

## 2023-01-17 DIAGNOSIS — J9601 Acute respiratory failure with hypoxia: Secondary | ICD-10-CM | POA: Diagnosis not present

## 2023-01-17 DIAGNOSIS — J189 Pneumonia, unspecified organism: Secondary | ICD-10-CM | POA: Diagnosis not present

## 2023-01-19 ENCOUNTER — Ambulatory Visit: Payer: Medicaid Other | Admitting: Family Medicine

## 2023-01-20 ENCOUNTER — Other Ambulatory Visit: Payer: Self-pay | Admitting: Family Medicine

## 2023-01-25 NOTE — Telephone Encounter (Signed)
Called patient and scheduled appointment.   Thanks [employer]

## 2023-02-04 ENCOUNTER — Ambulatory Visit: Payer: Medicaid Other | Admitting: Family Medicine

## 2023-02-16 DIAGNOSIS — J189 Pneumonia, unspecified organism: Secondary | ICD-10-CM | POA: Diagnosis not present

## 2023-02-16 DIAGNOSIS — J9601 Acute respiratory failure with hypoxia: Secondary | ICD-10-CM | POA: Diagnosis not present

## 2023-03-02 ENCOUNTER — Encounter: Payer: Self-pay | Admitting: Family Medicine

## 2023-03-02 ENCOUNTER — Ambulatory Visit: Payer: Medicaid Other | Admitting: Family Medicine

## 2023-03-02 VITALS — BP 136/85 | HR 82 | Ht 64.0 in | Wt 324.0 lb

## 2023-03-02 DIAGNOSIS — F419 Anxiety disorder, unspecified: Secondary | ICD-10-CM | POA: Diagnosis not present

## 2023-03-02 DIAGNOSIS — I10 Essential (primary) hypertension: Secondary | ICD-10-CM

## 2023-03-02 DIAGNOSIS — Z6841 Body Mass Index (BMI) 40.0 and over, adult: Secondary | ICD-10-CM | POA: Diagnosis present

## 2023-03-02 DIAGNOSIS — F32A Depression, unspecified: Secondary | ICD-10-CM

## 2023-03-02 DIAGNOSIS — Z1159 Encounter for screening for other viral diseases: Secondary | ICD-10-CM | POA: Diagnosis not present

## 2023-03-02 DIAGNOSIS — R911 Solitary pulmonary nodule: Secondary | ICD-10-CM | POA: Diagnosis not present

## 2023-03-02 DIAGNOSIS — J984 Other disorders of lung: Secondary | ICD-10-CM | POA: Diagnosis not present

## 2023-03-02 LAB — POCT GLYCOSYLATED HEMOGLOBIN (HGB A1C): Hemoglobin A1C: 5.9 % — AB (ref 4.0–5.6)

## 2023-03-02 MED ORDER — OMEPRAZOLE 20 MG PO CPDR: 20.0000 mg | DELAYED_RELEASE_CAPSULE | Freq: Every day | ORAL | 3 refills | Status: DC

## 2023-03-02 MED ORDER — COMBIVENT RESPIMAT 20-100 MCG/ACT IN AERS: 1.0000 | INHALATION_SPRAY | Freq: Four times a day (QID) | RESPIRATORY_TRACT | 3 refills | Status: DC

## 2023-03-02 MED ORDER — HYDROXYZINE HCL 10 MG PO TABS: 10.0000 mg | ORAL_TABLET | Freq: Three times a day (TID) | ORAL | 0 refills | Status: AC | PRN

## 2023-03-02 NOTE — Assessment & Plan Note (Signed)
BP much improved on amlodipine 10 daily.  Expect improvement with continued weight loss strategies.  Can consider adding ARB as needed if persistently elevated at next visit and with continued lifestyle changes.

## 2023-03-02 NOTE — Patient Instructions (Signed)
We have ordered labs today. I will keep you updated on results. I have sent in hydroxyzine for anxiety to help calm nerves since you have been through a lot. Be careful with driving as this can make you sleepy. I have provided therapists to contact below, as well. I will look into your oxygen for you and get you more for home. We can see how your labs look and chat more about weight loss. Keep up the great work with exercise!

## 2023-03-02 NOTE — Assessment & Plan Note (Signed)
Does not appear to been able to follow with [facility].  Difficult obtaining results with lifestyle changes alone.  Given history of thyroid issues in family and potential effects of thyroid dysfunction on weight, will collect TSH today.  Will also further characterize clinical picture with A1c, lipid panel.  Can consider semaglutide for weight management in the future as desired.

## 2023-03-02 NOTE — Assessment & Plan Note (Signed)
Exacerbated by recent stressors.  Has tried SSRI in the past with what sounds like resultant mania.  Understandably wants to avoid further antidepressants for now.  Will prescribe hydroxyzine 10 mg 3 times daily as needed for acute anxiety.  Discussed potential sedating effects and caution when operating vehicle.  Also sent patient message with Medicaid therapy resources.
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
## 2023-03-02 NOTE — Assessment & Plan Note (Signed)
As diagnosed by PFT.  No parenchymal cause seen on recent LDCT.  Likely contributor is habitus.  Continue O2 as needed as well as inhalers.  Hope this will continue to improve with weight changes.

## 2023-03-02 NOTE — Assessment & Plan Note (Signed)
Appears benign on recent LDCT.  Recommendations to follow-up annually.

## 2023-03-03 LAB — HCV AB W REFLEX TO QUANT PCR: HCV Ab: NONREACTIVE

## 2023-03-03 LAB — TSH RFX ON ABNORMAL TO FREE T4: TSH: 1.69 u[IU]/mL (ref 0.450–4.500)

## 2023-03-03 LAB — LIPID PANEL
Chol/HDL Ratio: 3.8 {ratio} (ref 0.0–4.4)
Cholesterol, Total: 181 mg/dL (ref 100–199)
HDL: 48 mg/dL (ref >39)
LDL Chol Calc (NIH): 118 mg/dL — ABNORMAL HIGH (ref 0–99)
Triglycerides: 82 mg/dL (ref 0–149)
VLDL Cholesterol Cal: 15 mg/dL (ref 5–40)

## 2023-03-03 LAB — HCV INTERPRETATION

## 2023-03-03 MED ORDER — WEGOVY 0.25 MG/0.5ML ~~LOC~~ SOAJ: 0.2500 mg | SUBCUTANEOUS | 0 refills | Status: DC

## 2023-03-10 ENCOUNTER — Telehealth: Payer: Self-pay

## 2023-03-10 NOTE — Telephone Encounter (Signed)
Patient calls nurse line in regards to [facility]   She reports the medication is needing a prior authorization.   Patient advised these PAs are taking ~ 1-2 weeks to process.   Will forward to Pharmacy.

## 2023-03-11 NOTE — Telephone Encounter (Signed)
Pharmacy Patient Advocate Encounter   PA required; PA submitted to [facility] Medicaid via CoverMyMeds Key/confirmation #/EOC [id number]. Status is pending

## 2023-03-11 NOTE — Telephone Encounter (Signed)
Pharmacy Patient Advocate Encounter   PA required; PA started via CoverMyMeds. KEY [id number]. . Waiting for clinical questions to populate.

## 2023-03-12 ENCOUNTER — Other Ambulatory Visit: Payer: Self-pay | Admitting: Family Medicine

## 2023-03-15 NOTE — Telephone Encounter (Signed)
Pharmacy Patient Advocate Encounter  Received notification from [facility] that Prior Authorization for [facility] has been APPROVED from 03/11/23 to 09/09/23

## 2023-03-15 NOTE — Telephone Encounter (Signed)
Pharmacy and patient have been updated.

## 2023-03-16 ENCOUNTER — Other Ambulatory Visit (HOSPITAL_COMMUNITY): Payer: Self-pay

## 2023-03-17 ENCOUNTER — Other Ambulatory Visit (HOSPITAL_BASED_OUTPATIENT_CLINIC_OR_DEPARTMENT_OTHER): Payer: Self-pay

## 2023-03-17 ENCOUNTER — Other Ambulatory Visit: Payer: Self-pay

## 2023-03-17 MED ORDER — SEMAGLUTIDE-WEIGHT MANAGEMENT 0.25 MG/0.5ML ~~LOC~~ SOAJ
0.2500 mg | SUBCUTANEOUS | 0 refills | Status: DC
  Filled 2023-03-17: qty 2, 28d supply, fill #0

## 2023-03-18 ENCOUNTER — Other Ambulatory Visit (HOSPITAL_BASED_OUTPATIENT_CLINIC_OR_DEPARTMENT_OTHER): Payer: Self-pay

## 2023-03-19 DIAGNOSIS — J189 Pneumonia, unspecified organism: Secondary | ICD-10-CM | POA: Diagnosis not present

## 2023-03-19 DIAGNOSIS — J9601 Acute respiratory failure with hypoxia: Secondary | ICD-10-CM | POA: Diagnosis not present

## 2023-03-30 ENCOUNTER — Ambulatory Visit: Payer: Medicaid Other | Admitting: Family Medicine

## 2023-04-13 ENCOUNTER — Ambulatory Visit: Payer: Medicaid Other | Admitting: Family Medicine

## 2023-04-13 ENCOUNTER — Encounter: Payer: Self-pay | Admitting: Family Medicine

## 2023-04-13 ENCOUNTER — Other Ambulatory Visit (HOSPITAL_COMMUNITY): Payer: Self-pay

## 2023-04-13 VITALS — BP 125/86 | HR 74 | Ht 64.0 in | Wt 323.0 lb

## 2023-04-13 DIAGNOSIS — R634 Abnormal weight loss: Secondary | ICD-10-CM | POA: Diagnosis not present

## 2023-04-13 DIAGNOSIS — Z23 Encounter for immunization: Secondary | ICD-10-CM

## 2023-04-13 DIAGNOSIS — Z1211 Encounter for screening for malignant neoplasm of colon: Secondary | ICD-10-CM

## 2023-04-13 DIAGNOSIS — Z6841 Body Mass Index (BMI) 40.0 and over, adult: Secondary | ICD-10-CM | POA: Diagnosis present

## 2023-04-13 MED ORDER — TETANUS-DIPHTH-ACELL PERTUSSIS 5-2.5-18.5 LF-MCG/0.5 IM SUSP: 0.5000 mL | Freq: Once | INTRAMUSCULAR | 0 refills | Status: AC

## 2023-04-13 MED ORDER — WEGOVY 0.5 MG/0.5ML ~~LOC~~ SOAJ
0.5000 mg | SUBCUTANEOUS | 0 refills | Status: DC
  Filled 2023-04-13: qty 2, 28d supply, fill #0

## 2023-04-13 MED ORDER — ZOSTER VAC RECOMB ADJUVANTED 50 MCG/0.5ML IM SUSR: 0.5000 mL | Freq: Once | INTRAMUSCULAR | 0 refills | Status: AC

## 2023-04-13 NOTE — Progress Notes (Signed)
    SUBJECTIVE:   CHIEF COMPLAINT / HPI:   Weight loss On Wegovy.  Finished her fourth dose today.  Has had some nausea but without vomiting.  This is tolerable.  She has lost 1 pound.  She feels a lot better and notices that she gets around better than before.  She would like to go up on this to continue losing weight and feeling better.  OBJECTIVE:   BP 125/86   Pulse 74   Ht 5\' 4"  (1.626 m)   Wt (!) 323 lb (146.5 kg)   LMP 02/08/2014   SpO2 94%   BMI 55.44 kg/m   General: Alert and oriented, in NAD Skin: Warm, dry, and intact without lesions HEENT: NCAT, EOM grossly normal, midline nasal septum Cardiac: RRR, no m/r/g appreciated Respiratory: CTAB, breathing and speaking comfortably on RA Abdominal: Soft, nontender, nondistended, normoactive bowel sounds Extremities: Moves all extremities grossly equally Neurological: No gross focal deficit Psychiatric: Appropriate mood and affect   ASSESSMENT/PLAN:   BMI 50.0-59.9, adult (HCC) Has been tolerating Wegovy 0.25 mg weekly.  After discussion with patient, will go up to 0.5 mg weekly for increased weight loss.  Continue lifestyle changes.  Advised to let me know if nausea worsened or is unbearable.  Follow-up in 6 weeks to assess improvement.   Health maintenance She was given her flu vaccine today without incident.  Gave patient printed prescription for tetanus and Shingrix vaccines.  Will send message to front office to help her get scheduled for mammogram.  Referred for colonoscopy.  Patient has history of trauma and is uncomfortable with Pap smear at this time; consider self swab HPV testing in the future if available.  Janeal Holmes, MD [facility]

## 2023-04-13 NOTE — Assessment & Plan Note (Signed)
Has been tolerating Wegovy 0.25 mg weekly.  After discussion with patient, will go up to 0.5 mg weekly for increased weight loss.  Continue lifestyle changes.  Advised to let me know if nausea worsened or is unbearable.  Follow-up in 6 weeks to assess improvement.

## 2023-04-13 NOTE — Patient Instructions (Signed)
I am so happy to hear you are doing well on the [facility]!  We have went up to the next dose up.  Come back in 6 weeks to see how you are doing or sooner if needed. I am happy you are thinking about getting a new therapist.  If you need more help for the first of the year, let me know. I have given you paper prescriptions for the shingles and tetanus vaccines. I have sent in a referral to get your colonoscopy. You received your flu vaccine today without incident Schedule whenever you are ready to have your Pap smear.

## 2023-04-18 DIAGNOSIS — J189 Pneumonia, unspecified organism: Secondary | ICD-10-CM | POA: Diagnosis not present

## 2023-04-18 DIAGNOSIS — J9601 Acute respiratory failure with hypoxia: Secondary | ICD-10-CM | POA: Diagnosis not present

## 2023-05-07 ENCOUNTER — Other Ambulatory Visit (HOSPITAL_COMMUNITY): Payer: Self-pay

## 2023-05-07 ENCOUNTER — Other Ambulatory Visit: Payer: Self-pay

## 2023-05-07 MED ORDER — WEGOVY 0.5 MG/0.5ML ~~LOC~~ SOAJ
0.5000 mg | SUBCUTANEOUS | 0 refills | Status: DC
  Filled 2023-05-07: qty 2, 28d supply, fill #0

## 2023-05-07 NOTE — Telephone Encounter (Signed)
Patient calls nurse line requesting refill on Wegovy. She reports that she has been tolerating increased dosing, with no adverse side effects.  Refill pended to encounter.   Veronda Prude, RN

## 2023-05-19 DIAGNOSIS — J189 Pneumonia, unspecified organism: Secondary | ICD-10-CM | POA: Diagnosis not present

## 2023-05-19 DIAGNOSIS — J9601 Acute respiratory failure with hypoxia: Secondary | ICD-10-CM | POA: Diagnosis not present

## 2023-05-28 ENCOUNTER — Other Ambulatory Visit (HOSPITAL_COMMUNITY): Payer: Self-pay

## 2023-05-28 ENCOUNTER — Telehealth: Payer: Self-pay

## 2023-05-28 MED ORDER — WEGOVY 1 MG/0.5ML ~~LOC~~ SOAJ
1.0000 mg | SUBCUTANEOUS | 0 refills | Status: DC
  Filled 2023-05-28 – 2023-06-02 (×2): qty 2, 28d supply, fill #0

## 2023-05-28 NOTE — Telephone Encounter (Signed)
 Patient calls nurse line regarding Wegovy  prescription.   She is requesting to increase dosage.   She reports that she is tolerating well and is not having any adverse side effects.   If appropriate, please send new dosage to [facility].   Chiquita JAYSON English, RN

## 2023-05-28 NOTE — Telephone Encounter (Signed)
 Wegovy increased to 1 mg weekly given tolerability with 0.5 mg.

## 2023-05-31 ENCOUNTER — Ambulatory Visit: Payer: Self-pay | Admitting: Family Medicine

## 2023-06-02 ENCOUNTER — Other Ambulatory Visit (HOSPITAL_COMMUNITY): Payer: Self-pay

## 2023-06-11 ENCOUNTER — Other Ambulatory Visit: Payer: Self-pay | Admitting: Family Medicine

## 2023-06-14 ENCOUNTER — Ambulatory Visit: Payer: Medicaid Other | Admitting: Family Medicine

## 2023-06-19 DIAGNOSIS — J9601 Acute respiratory failure with hypoxia: Secondary | ICD-10-CM | POA: Diagnosis not present

## 2023-06-19 DIAGNOSIS — J189 Pneumonia, unspecified organism: Secondary | ICD-10-CM | POA: Diagnosis not present

## 2023-06-28 DIAGNOSIS — J189 Pneumonia, unspecified organism: Secondary | ICD-10-CM | POA: Diagnosis not present

## 2023-06-28 DIAGNOSIS — J9601 Acute respiratory failure with hypoxia: Secondary | ICD-10-CM | POA: Diagnosis not present

## 2023-07-06 ENCOUNTER — Other Ambulatory Visit: Payer: Self-pay | Admitting: Family Medicine

## 2023-07-06 ENCOUNTER — Other Ambulatory Visit (HOSPITAL_COMMUNITY): Payer: Self-pay

## 2023-07-06 MED ORDER — WEGOVY 1 MG/0.5ML ~~LOC~~ SOAJ
1.0000 mg | SUBCUTANEOUS | 0 refills | Status: DC
  Filled 2023-07-06: qty 2, 28d supply, fill #0

## 2023-07-07 ENCOUNTER — Other Ambulatory Visit (HOSPITAL_COMMUNITY): Payer: Self-pay

## 2023-07-17 DIAGNOSIS — J9601 Acute respiratory failure with hypoxia: Secondary | ICD-10-CM | POA: Diagnosis not present

## 2023-07-17 DIAGNOSIS — J189 Pneumonia, unspecified organism: Secondary | ICD-10-CM | POA: Diagnosis not present

## 2023-07-19 ENCOUNTER — Other Ambulatory Visit: Payer: Self-pay | Admitting: Family Medicine

## 2023-07-19 ENCOUNTER — Encounter: Payer: Self-pay | Admitting: Family Medicine

## 2023-07-19 DIAGNOSIS — Z1231 Encounter for screening mammogram for malignant neoplasm of breast: Secondary | ICD-10-CM

## 2023-07-26 DIAGNOSIS — J189 Pneumonia, unspecified organism: Secondary | ICD-10-CM | POA: Diagnosis not present

## 2023-07-26 DIAGNOSIS — J9601 Acute respiratory failure with hypoxia: Secondary | ICD-10-CM | POA: Diagnosis not present

## 2023-07-28 ENCOUNTER — Encounter: Payer: Self-pay | Admitting: Family Medicine

## 2023-07-30 ENCOUNTER — Encounter

## 2023-08-11 ENCOUNTER — Telehealth: Payer: Self-pay

## 2023-08-11 NOTE — Telephone Encounter (Signed)
 Dr. Chales Abrahams, (out of office at this time) In chart prepping for Pre Aisit appointments, it was found that this patient's BMI>50 =(55.4).    Per protocol, please advise if you wish to have the patient rescheduled for an OV or if the patient can be a direct at the hospital.  The patient is currently scheduled for her PV in rm 52 on 08/24/2023.  I have cancelled her [facility] procedure, awaiting your response.  Please/thank you Bre

## 2023-08-16 ENCOUNTER — Ambulatory Visit: Admitting: Family Medicine

## 2023-08-16 ENCOUNTER — Other Ambulatory Visit (HOSPITAL_COMMUNITY): Payer: Self-pay

## 2023-08-16 VITALS — BP 126/75 | HR 75 | Wt 308.0 lb

## 2023-08-16 DIAGNOSIS — I10 Essential (primary) hypertension: Secondary | ICD-10-CM

## 2023-08-16 DIAGNOSIS — K219 Gastro-esophageal reflux disease without esophagitis: Secondary | ICD-10-CM | POA: Diagnosis not present

## 2023-08-16 DIAGNOSIS — Z0000 Encounter for general adult medical examination without abnormal findings: Secondary | ICD-10-CM | POA: Diagnosis not present

## 2023-08-16 DIAGNOSIS — Z122 Encounter for screening for malignant neoplasm of respiratory organs: Secondary | ICD-10-CM | POA: Diagnosis not present

## 2023-08-16 DIAGNOSIS — Z6841 Body Mass Index (BMI) 40.0 and over, adult: Secondary | ICD-10-CM

## 2023-08-16 DIAGNOSIS — J984 Other disorders of lung: Secondary | ICD-10-CM | POA: Diagnosis not present

## 2023-08-16 DIAGNOSIS — L720 Epidermal cyst: Secondary | ICD-10-CM | POA: Diagnosis not present

## 2023-08-16 DIAGNOSIS — R911 Solitary pulmonary nodule: Secondary | ICD-10-CM | POA: Diagnosis not present

## 2023-08-16 MED ORDER — OMEPRAZOLE 20 MG PO CPDR
20.0000 mg | DELAYED_RELEASE_CAPSULE | Freq: Every day | ORAL | 3 refills | Status: AC
  Filled 2023-08-16 – 2023-09-09 (×3): qty 90, 90d supply, fill #0
  Filled 2023-12-02: qty 90, 90d supply, fill #1
  Filled 2024-02-24: qty 90, 90d supply, fill #2
  Filled 2024-05-30: qty 90, 90d supply, fill #3

## 2023-08-16 MED ORDER — MONTELUKAST SODIUM 10 MG PO TABS
10.0000 mg | ORAL_TABLET | Freq: Every day | ORAL | 3 refills | Status: AC
Start: 2023-08-16 — End: ?
  Filled 2023-08-16: qty 30, 30d supply, fill #0

## 2023-08-16 MED ORDER — WEGOVY 1 MG/0.5ML ~~LOC~~ SOAJ
1.0000 mg | SUBCUTANEOUS | 3 refills | Status: DC
Start: 2023-08-16 — End: 2023-12-03
  Filled 2023-08-16: qty 2, 28d supply, fill #0
  Filled 2023-09-23 – 2023-09-27 (×2): qty 2, 28d supply, fill #1
  Filled 2023-10-31: qty 2, 28d supply, fill #2

## 2023-08-16 MED ORDER — COMBIVENT RESPIMAT 20-100 MCG/ACT IN AERS
1.0000 | INHALATION_SPRAY | Freq: Four times a day (QID) | RESPIRATORY_TRACT | 3 refills | Status: AC
  Filled 2023-08-16 – 2023-09-23 (×2): qty 4, 30d supply, fill #0
  Filled 2024-02-24: qty 4, 30d supply, fill #1
  Filled 2024-03-29 – 2024-05-30 (×2): qty 4, 30d supply, fill #2

## 2023-08-16 NOTE — Progress Notes (Unsigned)
    SUBJECTIVE:   CHIEF COMPLAINT / HPI:   Follow up weight loss Increased Wegovy dosing to 1 mg weekly in January 2025.  PERTINENT  PMH / PSH: ***  OBJECTIVE:   BP 126/75   Pulse 75   Wt (!) 308 lb (139.7 kg)   LMP 02/08/2014   SpO2 92%   BMI 52.87 kg/m   General: Alert and oriented, in NAD Skin: Warm, dry, and intact without lesions HEENT: NCAT, EOM grossly normal, midline nasal septum Cardiac: RRR, no m/r/g appreciated Respiratory: CTAB, breathing and speaking comfortably on RA Abdominal: Soft, nontender, nondistended, normoactive bowel sounds Extremities: Moves all extremities grossly equally Neurological: No gross focal deficit Psychiatric: Appropriate mood and affect  ASSESSMENT/PLAN:   Assessment & Plan Hypertension, unspecified type  BMI 50.0-59.9, adult (HCC)  Pulmonary nodule  Restrictive lung disease     A1c Omperazole Vaccines? LDCT screening  Janeal Holmes, MD [facility]

## 2023-08-16 NOTE — Patient Instructions (Signed)
 I have refilled your medications.  I have sent in Singulair to help control your asthma symptoms.  Be sure to call [facility] to schedule your mammogram.  Be sure to get your shingles vaccine at your pharmacy.  I will be on the lookout for your colonoscopy results.  Come back in 1-3 months for pap smear and possible cyst removal on your back.

## 2023-08-17 ENCOUNTER — Encounter: Payer: Self-pay | Admitting: Family Medicine

## 2023-08-17 DIAGNOSIS — J9601 Acute respiratory failure with hypoxia: Secondary | ICD-10-CM | POA: Diagnosis not present

## 2023-08-17 DIAGNOSIS — J189 Pneumonia, unspecified organism: Secondary | ICD-10-CM | POA: Diagnosis not present

## 2023-08-17 NOTE — Telephone Encounter (Signed)
 Bre, It is a screening colonoscopy.  BMI above 50 Please directly schedule colonoscopy at [facility] with me or any other provider RG

## 2023-08-17 NOTE — Assessment & Plan Note (Signed)
 Also with component of allergies.  Continue inhalers as needed.  Will add on Singulair to help control symptoms as well given intolerance to oral and intranasal antihistamines as well as intranasal steroids.

## 2023-08-17 NOTE — Assessment & Plan Note (Signed)
 Remains on omeprazole as controller therapy.  Has trialed off of this with H2 blocker and as needed calcium carbonate without relief.  Discussed long-term risk and benefits of medication.  Will continue for now.

## 2023-08-17 NOTE — Assessment & Plan Note (Signed)
 Controlled.  Continue amlodipine-olmesartan and lifestyle changes.

## 2023-08-17 NOTE — Assessment & Plan Note (Signed)
 As evidenced on prior CT.  Low-dose CT for lung cancer screening last year indicated likely benign appearance.  However, will order annual screening both for lung cancer and for surveillance of pulmonary nodule.

## 2023-08-17 NOTE — Assessment & Plan Note (Signed)
 Has lost 15 pounds since initiation of Wegovy.  Since she is having good results with this and lifestyle changes, will continue with 1 mg Wegovy weekly.  Consider repeat A1c at next visit.

## 2023-08-18 ENCOUNTER — Other Ambulatory Visit (HOSPITAL_COMMUNITY): Payer: Self-pay

## 2023-08-18 DIAGNOSIS — Z1211 Encounter for screening for malignant neoplasm of colon: Secondary | ICD-10-CM

## 2023-08-18 NOTE — Progress Notes (Signed)
 Patient scheduled at [facility] 09-14-23 at 9 case number [id number]. LVM for patient to call back

## 2023-08-18 NOTE — Telephone Encounter (Signed)
 Patient scheduled at [facility] 09-14-23 at 9 case number [id number]. LVM for patient to call back

## 2023-08-18 NOTE — Telephone Encounter (Signed)
 Please see note below and assist.

## 2023-08-18 NOTE — Telephone Encounter (Signed)
 Thank you  Will note this information on the PV chart

## 2023-08-18 NOTE — Telephone Encounter (Signed)
 Please see previous message concerning patient- if you are not able to get the patient scheduled for a [facility] procedure within time frame-please reschedule PV appt and let PV know-  please/thank you Bre

## 2023-08-20 ENCOUNTER — Telehealth: Payer: Self-pay | Admitting: Gastroenterology

## 2023-08-20 NOTE — Telephone Encounter (Signed)
 Rescheduled colon for 10/19/23 at 11:15 am at [facility] with Dr. Chales Abrahams. PV scheduled for 10/04/23 at 10:00 am by phone.

## 2023-08-20 NOTE — Telephone Encounter (Signed)
 Patient calling to cancel procedure due to care partner arrangements. Please advise.

## 2023-08-20 NOTE — Telephone Encounter (Signed)
 Discussed rescheduling patient for 6/3 or 6/12 at the hospital. Needs PV as well. She will call me back in a little while to let me know which date works best.

## 2023-08-20 NOTE — Telephone Encounter (Signed)
error 

## 2023-08-24 ENCOUNTER — Encounter

## 2023-08-24 NOTE — Telephone Encounter (Signed)
 Noted information on PV chart, PV chart moved to rm 50 to prepare for chart prep for that day.

## 2023-08-26 ENCOUNTER — Other Ambulatory Visit (HOSPITAL_COMMUNITY): Payer: Self-pay

## 2023-08-26 DIAGNOSIS — J9601 Acute respiratory failure with hypoxia: Secondary | ICD-10-CM | POA: Diagnosis not present

## 2023-08-26 DIAGNOSIS — J189 Pneumonia, unspecified organism: Secondary | ICD-10-CM | POA: Diagnosis not present

## 2023-08-30 ENCOUNTER — Other Ambulatory Visit (HOSPITAL_COMMUNITY): Payer: Self-pay

## 2023-08-31 ENCOUNTER — Encounter: Admitting: Gastroenterology

## 2023-09-09 ENCOUNTER — Other Ambulatory Visit (HOSPITAL_COMMUNITY): Payer: Self-pay

## 2023-09-09 ENCOUNTER — Other Ambulatory Visit: Payer: Self-pay

## 2023-09-16 DIAGNOSIS — J189 Pneumonia, unspecified organism: Secondary | ICD-10-CM | POA: Diagnosis not present

## 2023-09-16 DIAGNOSIS — J9601 Acute respiratory failure with hypoxia: Secondary | ICD-10-CM | POA: Diagnosis not present

## 2023-09-22 ENCOUNTER — Encounter: Admitting: Gastroenterology

## 2023-09-23 ENCOUNTER — Other Ambulatory Visit (HOSPITAL_COMMUNITY): Payer: Self-pay

## 2023-09-24 ENCOUNTER — Telehealth: Payer: Self-pay

## 2023-09-24 NOTE — Telephone Encounter (Signed)
 Pharmacy Patient Advocate Encounter   Received notification from CoverMyMeds that prior authorization for WEGOVY  is required/requested.   Insurance verification completed.   The patient is insured through [facility] .   PA required; PA submitted to above mentioned insurance via CoverMyMeds Key/confirmation #/EOC [id number]. Status is pending

## 2023-09-25 DIAGNOSIS — J9601 Acute respiratory failure with hypoxia: Secondary | ICD-10-CM | POA: Diagnosis not present

## 2023-09-25 DIAGNOSIS — J189 Pneumonia, unspecified organism: Secondary | ICD-10-CM | POA: Diagnosis not present

## 2023-09-27 ENCOUNTER — Encounter (HOSPITAL_COMMUNITY): Payer: Self-pay

## 2023-09-27 ENCOUNTER — Other Ambulatory Visit (HOSPITAL_COMMUNITY): Payer: Self-pay

## 2023-09-27 NOTE — Telephone Encounter (Signed)
 Pharmacy Patient Advocate Encounter  Received notification from [facility] that Prior Authorization for WEGOVY  1MG  has been APPROVED from 09/24/23 to 03/26/24

## 2023-09-29 ENCOUNTER — Other Ambulatory Visit: Payer: Self-pay | Admitting: Family Medicine

## 2023-09-30 ENCOUNTER — Other Ambulatory Visit (HOSPITAL_COMMUNITY): Payer: Self-pay

## 2023-10-01 ENCOUNTER — Telehealth: Payer: Self-pay | Admitting: Gastroenterology

## 2023-10-01 NOTE — Telephone Encounter (Signed)
 Inbound call from patient requesting to cancel 6/3 colonoscopy at [facility]. States she is starting a new job on 5/26 and will not be able to receive any time off for procedure. Patient states she would like to reschedule once she learns time off policies at new job. Please advise, thank you.

## 2023-10-01 NOTE — Telephone Encounter (Signed)
 Procedure has been cancelled d/t patient starting new job. She said she will call back when ready to reschedule.

## 2023-10-04 ENCOUNTER — Encounter

## 2023-10-17 DIAGNOSIS — J9601 Acute respiratory failure with hypoxia: Secondary | ICD-10-CM | POA: Diagnosis not present

## 2023-10-17 DIAGNOSIS — J189 Pneumonia, unspecified organism: Secondary | ICD-10-CM | POA: Diagnosis not present

## 2023-10-19 ENCOUNTER — Encounter (HOSPITAL_COMMUNITY): Payer: Self-pay

## 2023-10-19 ENCOUNTER — Ambulatory Visit (HOSPITAL_COMMUNITY): Admit: 2023-10-19 | Admitting: Gastroenterology

## 2023-10-19 SURGERY — COLONOSCOPY
Anesthesia: Monitor Anesthesia Care

## 2023-10-26 DIAGNOSIS — J9601 Acute respiratory failure with hypoxia: Secondary | ICD-10-CM | POA: Diagnosis not present

## 2023-10-26 DIAGNOSIS — J189 Pneumonia, unspecified organism: Secondary | ICD-10-CM | POA: Diagnosis not present

## 2023-11-11 ENCOUNTER — Inpatient Hospital Stay: Admission: RE | Admit: 2023-11-11 | Source: Ambulatory Visit

## 2023-11-16 DIAGNOSIS — J9601 Acute respiratory failure with hypoxia: Secondary | ICD-10-CM | POA: Diagnosis not present

## 2023-11-16 DIAGNOSIS — J189 Pneumonia, unspecified organism: Secondary | ICD-10-CM | POA: Diagnosis not present

## 2023-11-17 ENCOUNTER — Other Ambulatory Visit: Payer: Self-pay | Admitting: Family Medicine

## 2023-11-17 DIAGNOSIS — R911 Solitary pulmonary nodule: Secondary | ICD-10-CM

## 2023-11-25 DIAGNOSIS — J9601 Acute respiratory failure with hypoxia: Secondary | ICD-10-CM | POA: Diagnosis not present

## 2023-11-25 DIAGNOSIS — J189 Pneumonia, unspecified organism: Secondary | ICD-10-CM | POA: Diagnosis not present

## 2023-12-03 ENCOUNTER — Ambulatory Visit: Admitting: Family Medicine

## 2023-12-03 ENCOUNTER — Other Ambulatory Visit (HOSPITAL_COMMUNITY): Payer: Self-pay

## 2023-12-03 VITALS — BP 120/80 | HR 71 | Ht 64.0 in | Wt 296.8 lb

## 2023-12-03 DIAGNOSIS — Z1211 Encounter for screening for malignant neoplasm of colon: Secondary | ICD-10-CM

## 2023-12-03 DIAGNOSIS — I10 Essential (primary) hypertension: Secondary | ICD-10-CM | POA: Diagnosis not present

## 2023-12-03 DIAGNOSIS — Z6841 Body Mass Index (BMI) 40.0 and over, adult: Secondary | ICD-10-CM | POA: Diagnosis not present

## 2023-12-03 DIAGNOSIS — Z59819 Housing instability, housed unspecified: Secondary | ICD-10-CM | POA: Diagnosis not present

## 2023-12-03 DIAGNOSIS — L720 Epidermal cyst: Secondary | ICD-10-CM

## 2023-12-03 MED ORDER — SEMAGLUTIDE-WEIGHT MANAGEMENT 1.7 MG/0.75ML ~~LOC~~ SOAJ
1.7000 mg | SUBCUTANEOUS | 1 refills | Status: AC
  Filled 2023-12-03: qty 3, 28d supply, fill #0
  Filled 2024-01-07: qty 3, 28d supply, fill #1

## 2023-12-03 NOTE — Assessment & Plan Note (Signed)
 Now nearly 30 pounds down.  We will increase Wegovy  to 1.7 mg weekly.  She will keep me updated on GI side effects.

## 2023-12-03 NOTE — Patient Instructions (Signed)
 I have refilled your wegovy .  I have sent in a care management referral.   Be sure to keep me updated on how the cyst does! Glad we could get some stuff out!

## 2023-12-03 NOTE — Progress Notes (Signed)
    SUBJECTIVE:   CHIEF COMPLAINT / HPI:   Weight loss Has been taking Wegovy  well at 1 mg. Diet and exercise is going well, too. She has some nausea when she went to the 1 mg dose from 0.75 mg, but this has gotten better over time.  Cyst on back and on scalp Has continued to get larger to where she can see it through her shirt. No redness or pain. No fevers. The area on her scalp is on the left side and is coming out of her hair at times.  OBJECTIVE:   BP 120/80   Pulse 71   Ht 5' 4 (1.626 m)   Wt 296 lb 12.8 oz (134.6 kg)   LMP 02/08/2014   SpO2 93%   BMI 50.95 kg/m   General: Alert and oriented, in NAD Skin: Warm, dry, and intact; again visualized 3 to 4 cm freely mobile and soft subcutaneous lesion over left temple consistent with lipoma, 2 to 3 cm firm though not fluctuant lesion over mid back without overlying erythema, drainage, bleeding with central pore  Respiratory: Breathing and speaking comfortably on RA Extremities: Moves all extremities grossly equally Neurological: No gross focal deficit Psychiatric: Appropriate mood and affect   ASSESSMENT/PLAN:   Assessment & Plan Epidermoid cyst of skin of back Cyst Removal Procedure Note Discussed risks/benefits of procedure, and patient gave consent for excision of cyst.  Prepped skin in usual sterile fashion.  Used 3cc of 1% lidocaine  with epinephrine  for local anesthesia, 3 mm punch biopsy, and scissors and forceps to open cyst sac.  Gentle pressure applied with expulsion of small amount of keratinous material.  Covered wound with sterile bandage and dressing.  EBL minimal.  Patient tolerated procedure well.  Discussed wound care and provided wound care handout, and advised f/u in 1 wk for suture removal.  BMI 50.0-59.9, adult (HCC) Now nearly 30 pounds down.  We will increase Wegovy  to 1.7 mg weekly.  She will keep me updated on GI side effects. Hypertension, unspecified type Well-controlled.  Continue current  management. Housing instability Unfortunately facing eviction.  Patient also with transportation difficulties.  Will refer to [facility] care management for aid. Screening for colon cancer She will reach back out for colonoscopy whenever her social conditions allow.   Stuart Redo, MD [facility]

## 2023-12-03 NOTE — Assessment & Plan Note (Signed)
 Well controlled  - Continue current management

## 2023-12-17 ENCOUNTER — Encounter: Payer: Self-pay | Admitting: Family Medicine

## 2023-12-17 DIAGNOSIS — J9601 Acute respiratory failure with hypoxia: Secondary | ICD-10-CM | POA: Diagnosis not present

## 2023-12-17 DIAGNOSIS — J189 Pneumonia, unspecified organism: Secondary | ICD-10-CM | POA: Diagnosis not present

## 2023-12-26 DIAGNOSIS — J9601 Acute respiratory failure with hypoxia: Secondary | ICD-10-CM | POA: Diagnosis not present

## 2023-12-26 DIAGNOSIS — J189 Pneumonia, unspecified organism: Secondary | ICD-10-CM | POA: Diagnosis not present

## 2023-12-29 ENCOUNTER — Telehealth: Payer: Self-pay | Admitting: *Deleted

## 2023-12-29 NOTE — Progress Notes (Signed)
 Complex Care Management Note Care Guide Note  12/29/2023 Name: [name] MRN: [id number] DOB: March 02, 1971   Complex Care Management Outreach Attempts: An unsuccessful telephone outreach was attempted today to offer the patient information about available complex care management services.  Follow Up Plan:  Additional outreach attempts will be made to offer the patient complex care management information and services.   Encounter Outcome:  Patient Request to Call Back  Harlene Satterfield  [facility]  [facility], [facility] Guide  Direct Dial: [phone]  Fax [phone]

## 2023-12-30 NOTE — Progress Notes (Signed)
 Complex Care Management Note Care Guide Note  12/30/2023 Name: DASHLEY MONTS MRN: [id number] DOB: 08/03/1970   Complex Care Management Outreach Attempts: A second unsuccessful outreach was attempted today to offer the patient with information about available complex care management services.  Follow Up Plan:  Additional outreach attempts will be made to offer the patient complex care management information and services.   Encounter Outcome:  No Answer  Harlene Satterfield  [facility]  [facility], [facility] Guide  Direct Dial: [phone]  Fax [phone]

## 2023-12-31 ENCOUNTER — Telehealth: Payer: Self-pay

## 2023-12-31 NOTE — Progress Notes (Signed)
 Complex Care Management Note Care Guide Note  12/31/2023 Name: [name] MRN: [id number] DOB: 1970/08/08  [name] is a 53 y.o. year old female who is a primary care patient of Mabe, Elna, MD . The community resource team was consulted for assistance with Transportation Needs  and housing.  SDOH screenings and interventions completed:  Yes  Social Drivers of Health From This Encounter   Food Insecurity: No Food Insecurity (12/31/2023)   Hunger Vital Sign    Worried About Running Out of Food in the Last Year: Never true    Ran Out of Food in the Last Year: Never true  Housing: Low Risk  (12/31/2023)   Housing Stability Vital Sign    Unable to Pay for Housing in the Last Year: No    Number of Times Moved in the Last Year: 0    Homeless in the Last Year: No  Financial Resource Strain: Medium Risk (12/31/2023)   Overall Financial Resource Strain (CARDIA)    Difficulty of Paying Living Expenses: Somewhat hard  Transportation Needs: No Transportation Needs (12/31/2023)   PRAPARE - Administrator, Civil Service (Medical): No    Lack of Transportation (Non-Medical): No  Utilities: Not At Risk (12/31/2023)   Utilities    Threatened with loss of utilities: No    SDOH Interventions Today    Flowsheet Row Most Recent Value  SDOH Interventions   Housing Interventions [employer] Referral, Other (Comment)  [Sent information via HelpFind for [employer] Rental Housing Counseling and Eviction Prevention programs.]  Transportation Interventions [employer] Resource Referral, Other (Comment)  [Emailed information via Regulatory affairs officer for [employer]. Patient is aware of her transportation benefit with [employer] Medicaid transportation. Patient stated her mother provides transportation but she needs a backup as well.]     Care guide performed the following interventions: Patient provided with information about care guide support  team and interviewed to confirm resource needs.  Follow Up Plan:  Client will call if she does not receive information. and No further follow up planned at this time. The patient has been provided with needed resources.  Encounter Outcome:  Patient Visit Completed  [facility]  [facility] Guide Direct Dial: [phone]  Fax: [phone] Website: [email or web address]

## 2023-12-31 NOTE — Progress Notes (Signed)
 Complex Care Management Note  Care Guide Note 12/31/2023 Name: AHLAYA ENDE MRN: [id number] DOB: 10/24/1970  TURA ROLLER is a 53 y.o. year old female who sees Mabe, Elna, MD for primary care. I reached out to Arland JONETTA Denise by phone today to offer complex care management services.  Ms. Ewen was given information about Complex Care Management services today including:   The Complex Care Management services include support from the care team which includes your Nurse Care Manager, Clinical Social Worker, or Pharmacist.  The Complex Care Management team is here to help remove barriers to the health concerns and goals most important to you. Complex Care Management services are voluntary, and the patient may decline or stop services at any time by request to their care team member.   Complex Care Management Consent Status: Patient agreed to services and verbal consent obtained.   Follow up plan:  Telephone appointment with complex care management team member scheduled for:  8/19 routed referral to [facility] guide.  Encounter Outcome:  Patient Scheduled  Harlene Satterfield  [facility]  [facility], [facility] Guide  Direct Dial: [phone]  Fax [phone]

## 2024-01-04 ENCOUNTER — Other Ambulatory Visit: Payer: Self-pay | Admitting: Licensed Clinical Social Worker

## 2024-01-07 ENCOUNTER — Other Ambulatory Visit (HOSPITAL_COMMUNITY): Payer: Self-pay

## 2024-01-10 ENCOUNTER — Telehealth: Payer: Self-pay | Admitting: *Deleted

## 2024-01-10 NOTE — Progress Notes (Signed)
 Complex Care Management Care Guide Note  01/10/2024 Name: [name] MRN: [id number] DOB: 28-Sep-1970  [name] is a 53 y.o. year old female who is a primary care patient of Mabe, Elna, MD and is actively engaged with the care management team. I reached out to Arland JONETTA Denise by phone today to assist with re-scheduling  with the Licensed Clinical Child psychotherapist.  Follow up plan: Unsuccessful telephone outreach attempt made. A HIPAA compliant phone message was left for the patient providing contact information and requesting a return call.  Harlene Satterfield  [facility]  [facility], [facility] Guide  Direct Dial: [phone]  Fax [phone]

## 2024-01-11 ENCOUNTER — Other Ambulatory Visit: Payer: Self-pay

## 2024-01-11 ENCOUNTER — Other Ambulatory Visit (HOSPITAL_COMMUNITY): Payer: Self-pay

## 2024-01-11 ENCOUNTER — Other Ambulatory Visit: Payer: Self-pay | Admitting: Family Medicine

## 2024-01-11 MED ORDER — AMLODIPINE-OLMESARTAN 10-20 MG PO TABS
1.0000 | ORAL_TABLET | Freq: Every day | ORAL | 3 refills | Status: AC
  Filled 2024-01-11: qty 90, 90d supply, fill #0
  Filled 2024-01-21: qty 5, 5d supply, fill #0
  Filled 2024-01-21: qty 85, 85d supply, fill #0
  Filled 2024-04-28: qty 90, 90d supply, fill #0

## 2024-01-17 DIAGNOSIS — J189 Pneumonia, unspecified organism: Secondary | ICD-10-CM | POA: Diagnosis not present

## 2024-01-17 DIAGNOSIS — J9601 Acute respiratory failure with hypoxia: Secondary | ICD-10-CM | POA: Diagnosis not present

## 2024-01-20 ENCOUNTER — Other Ambulatory Visit (HOSPITAL_COMMUNITY): Payer: Self-pay

## 2024-01-20 NOTE — Progress Notes (Signed)
 Complex Care Management Care Guide Note  01/20/2024 Name: TERESIA MYINT MRN: [id number] DOB: 1970-10-20  DAZARIA MACNEILL is a 53 y.o. year old female who is a primary care patient of Mabe, Elna, MD and is actively engaged with the care management team. I reached out to Arland JONETTA Denise by phone today to assist with scheduling  with the Licensed Clinical Social Worker.  Follow up plan: Telephone appointment with complex care management team member scheduled for:  01/31/24  Harlene Satterfield  [facility]  [facility] Care Institute, [facility] Guide  Direct Dial: [phone]  Fax [phone]

## 2024-01-21 ENCOUNTER — Other Ambulatory Visit (HOSPITAL_COMMUNITY): Payer: Self-pay

## 2024-01-21 ENCOUNTER — Other Ambulatory Visit: Payer: Self-pay

## 2024-01-26 DIAGNOSIS — J189 Pneumonia, unspecified organism: Secondary | ICD-10-CM | POA: Diagnosis not present

## 2024-01-26 DIAGNOSIS — J9601 Acute respiratory failure with hypoxia: Secondary | ICD-10-CM | POA: Diagnosis not present

## 2024-01-31 ENCOUNTER — Other Ambulatory Visit: Payer: Self-pay | Admitting: Licensed Clinical Social Worker

## 2024-01-31 ENCOUNTER — Other Ambulatory Visit: Payer: Self-pay

## 2024-01-31 NOTE — Patient Instructions (Signed)
 Visit Information  Thank you for taking time to visit with me today. Please don't hesitate to contact me if I can be of assistance to you before our next scheduled appointment.  Our next appointment is by telephone on 02/21/2024 at 9:00 AM   Please call the care guide team at [phone] if you need to cancel or reschedule your appointment.   Following is a copy of your care plan:   Goals Addressed             This Visit's Progress    VBCI Social Work Care Plan       Problems:   Financial stress               Transportation issues               Anxiety issues               CSW Clinical Goal(s):   Over the next 30  days the Patient will attend all scheduled medical appointments as evidenced by patient report and care team review of appointment completion in electronic medical record.             Over next 30 days, client will explore job options  for herself in the community and apply for possible jobs in the community AEB client report of job  applications completed by client   Interventions:  Spoke with client about client needs              Discussed transport needs of client              Discussed medication procurement of client              Discussed job needs of client. She wants full time work.               Discussed pain issues of client. She spoke of back pain issues              Provided counseling support for client              Discussed remote jobs as possibility for client. She is looking for possible remote jobs she could do currently from home.                Discussed program services of client with RN, LCSW, Pharmacist               Completed client assessments. Completed PHQ 2/9; Completed GAD-7              Discussed needs of client children.  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 Her sister lives in same apartment complex            Discussed client management of anxiety and stress issues            Encouraged client to continue to search for job openings in area. Encouraged client to call LCSW as needed for SW support at [phone]            [name] was appreciative of call from LCSW today                Patient Goals/Self-Care Activities:  Client to continue to search for job options in the  area              Client to attend scheduled medical appointments            Client to call LCSW as needed for SW support             Client to take medications as prescribed           Client to communicate with sister as needed to discuss ongoing client needs            Plan:   Telephone follow up appointment with care management team member scheduled for:  02/21/2024 at 9:00 AM        Please go to [facility] [address], [place] [phone]) if you are experiencing a Mental Health or Behavioral Health Crisis or need someone to talk to.  The patient verbalized understanding of instructions, educational materials, and care plan provided today and DECLINED offer to receive copy of patient instructions, educational materials, and care plan.     [name]  MSW, LCSW [place] Licensed Clinical Social Worker Direct Dial:  [phone] Fax:  [phone] Website:  [email or web address]

## 2024-01-31 NOTE — Patient Outreach (Signed)
 Complex Care Management   Visit Note  01/31/2024  Name:  [name] MRN: [id number] DOB: [birth year]  Situation: Referral received for Complex Care Management related to SDOH issues; financial stress; needs to find employment I obtained verbal consent from Patient.  Visit completed with Patient  on the phone  Background:   Past Medical History:  Diagnosis Date   Acid reflux    Anxiety    CAP (community acquired pneumonia) 03/11/2022   Chronic back pain    Chronic pain syndrome    Heart murmur    developed during pregnancy- leaky valve   Hypertension    Parainfluenza infection 03/11/2022    Assessment: Patient Reported Symptoms:  Cognitive Cognitive Status: Alert and oriented to person, place, and time Cognitive/Intellectual Conditions Management [RPT]: None reported or documented in medical history or problem list   Health Maintenance Behaviors: Annual physical exam, Stress management  Neurological Neurological Review of Symptoms: Weakness Neurological Management Strategies: Adequate rest, Coping strategies  HEENT HEENT Symptoms Reported: No symptoms reported HEENT Management Strategies: Adequate rest, Coping strategies    Cardiovascular Cardiovascular Symptoms Reported: No symptoms reported Cardiovascular Management Strategies: Adequate rest, Coping strategies  Respiratory Respiratory Symptoms Reported: No symptoms reported Respiratory Management Strategies: Adequate rest  Endocrine Endocrine Symptoms Reported: Weakness or fatigue, Shakiness Is patient diabetic?: No    Gastrointestinal Gastrointestinal Symptoms Reported: Reflux/heartburn Additional Gastrointestinal Details: gets heartburn frequently Gastrointestinal Management Strategies: Adequate rest, Coping strategies    Genitourinary Genitourinary Symptoms Reported: No symptoms reported Genitourinary Management Strategies: Adequate rest, Coping strategies  Integumentary Integumentary Symptoms Reported: No  symptoms reported Skin Management Strategies: Adequate rest  Musculoskeletal Musculoskelatal Symptoms Reviewed: Muscle pain, Weakness Musculoskeletal Management Strategies: Adequate rest, Coping strategies  Back pain issues (ongoing)    Psychosocial Psychosocial Symptoms Reported: Sadness - if selected complete PHQ 2-9, Anxiety - if selected complete GAD, Depression - if selected complete PHQ 2-9 Behavioral Management Strategies: Adequate rest, Coping strategies Major Change/Loss/Stressor/Fears (CP): Medical condition, self Techniques to Cope with Loss/Stress/Change: Counseling Quality of Family Relationships: supportive Do you feel physically threatened by others?: No Financial stress issues;  looking for employment     01/31/2024    PHQ2-9 Depression Screening   Little interest or pleasure in doing things Several days  Feeling down, depressed, or hopeless Several days  PHQ-2 - Total Score 2  Trouble falling or staying asleep, or sleeping too much Several days  Feeling tired or having little energy Several days  Poor appetite or overeating  Several days  Feeling bad about yourself - or that you are a failure or have let yourself or your family down Several days  Trouble concentrating on things, such as reading the newspaper or watching television Several days  Moving or speaking so slowly that other people could have noticed.  Or the opposite - being so fidgety or restless that you have been moving around a lot more than usual Several days  Thoughts that you would be better off dead, or hurting yourself in some way Not at all  PHQ2-9 Total Score 8  If you checked off any problems, how difficult have these problems made it for you to do your work, take care of things at home, or get along with other people Somewhat difficult  Depression Interventions/Treatment Counseling    Vitals:   Client said BP remains at normal levels if she takes prescribed BP medication  Medications Reviewed  Today     Reviewed by Frances Ozell GORMAN KEN (Social Worker) on 01/31/24  at 1432  Med List Status: <None>   Medication Order Taking? Sig Documenting Provider Last Dose Status Informant  albuterol  (VENTOLIN  HFA) 108 (90 Base) MCG/ACT inhaler [id number] Yes Inhale 2 puffs into the lungs every 6 (six) hours as needed for wheezing or shortness of breath. Rizwan, Saima, MD  Active   amlodipine -olmesartan  (AZOR ) 10-20 MG tablet [id number] Yes Take 1 tablet by mouth daily. Tharon Lung, MD  Active   hydrOXYzine  (ATARAX ) 10 MG tablet [id number] Yes Take 1 tablet (10 mg total) by mouth 3 (three) times daily as needed. Tharon Lung, MD  Active   Ipratropium-Albuterol  (COMBIVENT  RESPIMAT) 20-100 MCG/ACT AERS respimat [id number] Yes Inhale 1 puff into the lungs every 6 (six) hours. Tharon Lung, MD  Active   montelukast  (SINGULAIR ) 10 MG tablet [id number] Not taking  Take 1 tablet (10 mg total) by mouth at bedtime.  Patient not taking: Reported on 01/31/2024   Tharon Lung, MD  Active   omeprazole  (PRILOSEC) 20 MG capsule [id number] Yes Take 1 capsule (20 mg total) by mouth daily. Tharon Lung, MD  Active   semaglutide -weight management (WEGOVY ) 1.7 MG/0.75ML SOAJ SQ injection [id number] Yes Inject 1.7 mg into the skin once a week. Tharon Lung, MD  Active             Recommendation:   PCP Follow-up Continue Current Plan of Care Apply for job openings in the area Call LCSW as needed for SW support Take medications as prescribed.   Follow Up Plan:   Telephone follow up appointment date/time:  02/21/2024 at 9:00 AM   [name]  MSW, LCSW [place] Licensed Clinical Social Worker Direct Dial:  [phone] Fax:  [phone] Website:  [email or web address]

## 2024-02-01 ENCOUNTER — Other Ambulatory Visit (HOSPITAL_COMMUNITY): Payer: Self-pay

## 2024-02-16 ENCOUNTER — Telehealth: Payer: Self-pay

## 2024-02-16 DIAGNOSIS — J189 Pneumonia, unspecified organism: Secondary | ICD-10-CM | POA: Diagnosis not present

## 2024-02-16 DIAGNOSIS — J9601 Acute respiratory failure with hypoxia: Secondary | ICD-10-CM | POA: Diagnosis not present

## 2024-02-16 NOTE — Telephone Encounter (Signed)
 Patient calls nurse line regarding concerns with weight management.   She reports that she received notice from her insurance company that her Wegovy  is no longer going to be covered.   Please see the below bulletin.   Effective Oct. 1, 2025, Wegovy , Zepbound and Saxenda. Saxenda will no longer be covered for any indication. The Non-Incretin Mimetics class of drugs for treatment of obesity will continue to be managed through the PDL. Drugs in the Preferred status on the PDL include: diethylpropion, phendimetrazine and phentermine. These products do not require prior approval.   Continued Coverage for Other Indications   Wegovy    To reduce the risk of cardiovascular death, heart attack and stroke in adults with cardiovascular disease who are obese. For the treatment of noncirrhotic metabolic dysfunction-associated steatohepatitis (MASH), formerly known as nonalcoholic steatohepatitis (NASH), with moderate to advanced liver fibrosis (consistent with stages F2 to F3 fibrosis) in adults.   Zepbound To treat moderate to severe obstructive sleep apnea (OSA) in adults with obesity.   I have scheduled patient to discuss further with PCP on 10/10.  Chiquita JAYSON English, RN

## 2024-02-21 ENCOUNTER — Other Ambulatory Visit: Payer: Self-pay | Admitting: Licensed Clinical Social Worker

## 2024-02-21 NOTE — Patient Outreach (Signed)
 Complex Care Management   Visit Note  02/21/2024  Name:  [name] MRN: [id number] DOB: June 13, 1970  Situation: Referral received for Complex Care Management related to job needs: financial needs; SDOH needs I obtained verbal consent from Patient.  Visit completed with Patient  on the phone  Background:   Past Medical History:  Diagnosis Date   Acid reflux    Anxiety    CAP (community acquired pneumonia) 03/11/2022   Chronic back pain    Chronic pain syndrome    Heart murmur    developed during pregnancy- leaky valve   Hypertension    Parainfluenza infection 03/11/2022    Assessment: Patient Reported Symptoms:  Cognitive Cognitive Status: Alert and oriented to person, place, and time Cognitive/Intellectual Conditions Management [RPT]: None reported or documented in medical history or problem list   Health Maintenance Behaviors: Annual physical exam, Stress management Health Facilitated by: Stress management  Neurological Neurological Review of Symptoms: Weakness Neurological Management Strategies: Adequate rest, Coping strategies  HEENT HEENT Symptoms Reported: No symptoms reported HEENT Management Strategies: Coping strategies    Cardiovascular Cardiovascular Symptoms Reported: No symptoms reported Cardiovascular Management Strategies: Coping strategies, Adequate rest  Respiratory Respiratory Symptoms Reported: No symptoms reported Respiratory Management Strategies: Adequate rest, Oxygen  therapy  Endocrine Endocrine Symptoms Reported: Shakiness, Weakness or fatigue Is patient diabetic?: No    Gastrointestinal Gastrointestinal Symptoms Reported: Reflux/heartburn Gastrointestinal Management Strategies: Coping strategies    Genitourinary Genitourinary Symptoms Reported: No symptoms reported Genitourinary Management Strategies: Adequate rest, Coping strategies  Integumentary Integumentary Symptoms Reported: No symptoms reported Skin Management Strategies: Coping  strategies  Musculoskeletal Musculoskelatal Symptoms Reviewed: Back pain, Muscle pain, Weakness Musculoskeletal Management Strategies: Adequate rest, Coping strategies      Psychosocial Psychosocial Symptoms Reported: Sadness - if selected complete PHQ 2-9, Depression - if selected complete PHQ 2-9 Behavioral Management Strategies: Coping strategies, Adequate rest Major Change/Loss/Stressor/Fears (CP): Medical condition, self Techniques to Cope with Loss/Stress/Change: Counseling Quality of Family Relationships: supportive Do you feel physically threatened by others?: No    02/21/2024    PHQ2-9 Depression Screening   Little interest or pleasure in doing things Several days  Feeling down, depressed, or hopeless Several days  PHQ-2 - Total Score 2  Trouble falling or staying asleep, or sleeping too much Several days  Feeling tired or having little energy Several days  Poor appetite or overeating  Several days  Feeling bad about yourself - or that you are a failure or have let yourself or your family down Several days  Trouble concentrating on things, such as reading the newspaper or watching television Several days  Moving or speaking so slowly that other people could have noticed.  Or the opposite - being so fidgety or restless that you have been moving around a lot more than usual Several days  Thoughts that you would be better off dead, or hurting yourself in some way Not at all  PHQ2-9 Total Score 8  If you checked off any problems, how difficult have these problems made it for you to do your work, take care of things at home, or get along with other people Somewhat difficult  Depression Interventions/Treatment Counseling    Vitals:  No BP  problems noted by client during call with LCSW today  Medications Reviewed Today   Medications were not reviewed in this encounter     Recommendation:   PCP Follow-up Continue Current Plan of Care Client to research job options for client  in the area Client to call  LCSW as needed for SW support at [phone]  Follow Up Plan:   Client has LCSW phone number; client plans to call LCSW soon to schedule phone visit of client with LCSW to discuss client needs   [name]  MSW, LCSW [place] Licensed Clinical Social Worker Direct Dial:  [phone] Fax:  [phone] Website:  [email or web address]

## 2024-02-21 NOTE — Patient Instructions (Signed)
 Visit Information  Thank you for taking time to visit with me today. Please don't hesitate to contact me if I can be of assistance to you before our next scheduled appointment.  Client has LCSW phone number. Client to call LCSW soon to schedule phone visit of LCSW and client to discuss client needs  Please call the care guide team at [phone] if you need to cancel or reschedule your appointment.   Following is a copy of your care plan:   Goals Addressed             This Visit's Progress    VBCI Social Work Care Plan       Problems:   Financial stress               Transportation issues               Anxiety issues              Unemployed (looking for a job currently)  CSW Clinical Goal(s):   Over the next 30  days the Patient will attend all scheduled medical appointments as evidenced by patient report and care team review of appointment completion in electronic medical record.             Over next 30 days, client will explore job options  for herself in the community and apply for possible jobs in the community AEB client report of job  applications completed by client   Interventions:  Spoke with client about client needs             Client has been looking for a job. She said she has job interview scheduled for today.             Previously discussed job needs of client. She wants full time work.               Discussed pain issues of client. She spoke of back pain issues           Spoke previously with [place] about program services with RN, LCSW, Pharmacist              Completed client assessments. Completed PHQ 2/9; Completed GAD-7              Client asked if she could end LCSW call early related to going to job interview today. She said she would call LCSW soon to reschedule time to speak via phone with LCSW about her status.               . Encouraged client to call LCSW as needed for SW support at [phone]            [name] was appreciative of call from LCSW  today                Patient Goals/Self-Care Activities:  Client to continue to search for job options in the area              Client to attend scheduled medical appointments            Client to call LCSW as needed for SW support             Client to take medications as prescribed           Client to communicate with sister as needed to discuss ongoing client needs            Plan:   Client  has LCSW phone number and plans to call LCSW soon to schedule phone visit of client and LCSW.           Please go to [facility] [address], [place] [phone]) if you are experiencing a Mental Health or Behavioral Health Crisis or need someone to talk to.  The patient verbalized understanding of instructions, educational materials, and care plan provided today and DECLINED offer to receive copy of patient instructions, educational materials, and care plan.    [name]  MSW, LCSW [place] Licensed Clinical Social Worker Direct Dial:  [phone] Fax:  [phone] Website:  [email or web address]

## 2024-02-25 ENCOUNTER — Ambulatory Visit: Admitting: Family Medicine

## 2024-02-25 DIAGNOSIS — J9601 Acute respiratory failure with hypoxia: Secondary | ICD-10-CM | POA: Diagnosis not present

## 2024-03-18 DIAGNOSIS — J9601 Acute respiratory failure with hypoxia: Secondary | ICD-10-CM | POA: Diagnosis not present

## 2024-03-27 DIAGNOSIS — J9601 Acute respiratory failure with hypoxia: Secondary | ICD-10-CM | POA: Diagnosis not present

## 2024-03-29 ENCOUNTER — Other Ambulatory Visit: Payer: Self-pay

## 2024-03-29 ENCOUNTER — Encounter: Payer: Self-pay | Admitting: Pharmacist

## 2024-04-03 ENCOUNTER — Other Ambulatory Visit: Payer: Self-pay

## 2024-04-17 DIAGNOSIS — J9601 Acute respiratory failure with hypoxia: Secondary | ICD-10-CM | POA: Diagnosis not present

## 2024-04-26 DIAGNOSIS — J9601 Acute respiratory failure with hypoxia: Secondary | ICD-10-CM | POA: Diagnosis not present

## 2024-04-28 ENCOUNTER — Other Ambulatory Visit (HOSPITAL_COMMUNITY): Payer: Self-pay

## 2024-05-30 ENCOUNTER — Other Ambulatory Visit (HOSPITAL_COMMUNITY): Payer: Self-pay
